# Patient Record
Sex: Female | Born: 1980 | Race: White | Hispanic: No | Marital: Married | Smoking: Never smoker
Health system: Southern US, Community
[De-identification: ages and names within clinical notes are randomized; demographics above are authoritative.]

## PROBLEM LIST (undated history)

## (undated) DIAGNOSIS — E7800 Pure hypercholesterolemia, unspecified: Secondary | ICD-10-CM

## (undated) DIAGNOSIS — F329 Major depressive disorder, single episode, unspecified: Secondary | ICD-10-CM

## (undated) DIAGNOSIS — I10 Essential (primary) hypertension: Secondary | ICD-10-CM

## (undated) DIAGNOSIS — H04123 Dry eye syndrome of bilateral lacrimal glands: Secondary | ICD-10-CM

## (undated) DIAGNOSIS — K219 Gastro-esophageal reflux disease without esophagitis: Secondary | ICD-10-CM

## (undated) DIAGNOSIS — D229 Melanocytic nevi, unspecified: Secondary | ICD-10-CM

## (undated) DIAGNOSIS — E119 Type 2 diabetes mellitus without complications: Secondary | ICD-10-CM

## (undated) DIAGNOSIS — F319 Bipolar disorder, unspecified: Secondary | ICD-10-CM

## (undated) DIAGNOSIS — N921 Excessive and frequent menstruation with irregular cycle: Secondary | ICD-10-CM

## (undated) DIAGNOSIS — E282 Polycystic ovarian syndrome: Secondary | ICD-10-CM

## (undated) DIAGNOSIS — E669 Obesity, unspecified: Secondary | ICD-10-CM

## (undated) DIAGNOSIS — D509 Iron deficiency anemia, unspecified: Secondary | ICD-10-CM

## (undated) DIAGNOSIS — F411 Generalized anxiety disorder: Secondary | ICD-10-CM

## (undated) DIAGNOSIS — D259 Leiomyoma of uterus, unspecified: Secondary | ICD-10-CM

## (undated) DIAGNOSIS — G43909 Migraine, unspecified, not intractable, without status migrainosus: Secondary | ICD-10-CM

## (undated) DIAGNOSIS — J4520 Mild intermittent asthma, uncomplicated: Secondary | ICD-10-CM

## (undated) DIAGNOSIS — Z8639 Personal history of other endocrine, nutritional and metabolic disease: Secondary | ICD-10-CM

## (undated) DIAGNOSIS — J45909 Unspecified asthma, uncomplicated: Secondary | ICD-10-CM

## (undated) DIAGNOSIS — Z8619 Personal history of other infectious and parasitic diseases: Secondary | ICD-10-CM

## (undated) DIAGNOSIS — R0600 Dyspnea, unspecified: Secondary | ICD-10-CM

## (undated) DIAGNOSIS — F32A Depression, unspecified: Secondary | ICD-10-CM

## (undated) DIAGNOSIS — Z973 Presence of spectacles and contact lenses: Secondary | ICD-10-CM

## (undated) DIAGNOSIS — R51 Headache: Secondary | ICD-10-CM

## (undated) DIAGNOSIS — L309 Dermatitis, unspecified: Secondary | ICD-10-CM

## (undated) DIAGNOSIS — G4730 Sleep apnea, unspecified: Secondary | ICD-10-CM

## (undated) DIAGNOSIS — G4733 Obstructive sleep apnea (adult) (pediatric): Secondary | ICD-10-CM

## (undated) DIAGNOSIS — E782 Mixed hyperlipidemia: Secondary | ICD-10-CM

## (undated) DIAGNOSIS — R519 Headache, unspecified: Secondary | ICD-10-CM

## (undated) DIAGNOSIS — B019 Varicella without complication: Secondary | ICD-10-CM

## (undated) DIAGNOSIS — J309 Allergic rhinitis, unspecified: Secondary | ICD-10-CM

## (undated) DIAGNOSIS — N390 Urinary tract infection, site not specified: Secondary | ICD-10-CM

## (undated) DIAGNOSIS — J302 Other seasonal allergic rhinitis: Secondary | ICD-10-CM

## (undated) DIAGNOSIS — R42 Dizziness and giddiness: Secondary | ICD-10-CM

## (undated) DIAGNOSIS — F419 Anxiety disorder, unspecified: Secondary | ICD-10-CM

## (undated) DIAGNOSIS — R32 Unspecified urinary incontinence: Secondary | ICD-10-CM

## (undated) DIAGNOSIS — D069 Carcinoma in situ of cervix, unspecified: Secondary | ICD-10-CM

## (undated) DIAGNOSIS — K8020 Calculus of gallbladder without cholecystitis without obstruction: Secondary | ICD-10-CM

## (undated) DIAGNOSIS — N946 Dysmenorrhea, unspecified: Secondary | ICD-10-CM

## (undated) HISTORY — DX: Varicella without complication: B01.9

## (undated) HISTORY — PX: TYMPANOSTOMY TUBE PLACEMENT: SHX32

## (undated) HISTORY — DX: Unspecified asthma, uncomplicated: J45.909

## (undated) HISTORY — DX: Headache, unspecified: R51.9

## (undated) HISTORY — DX: Depression, unspecified: F32.A

## (undated) HISTORY — DX: Calculus of gallbladder without cholecystitis without obstruction: K80.20

## (undated) HISTORY — DX: Personal history of other infectious and parasitic diseases: Z86.19

## (undated) HISTORY — DX: Dizziness and giddiness: R42

## (undated) HISTORY — DX: Essential (primary) hypertension: I10

## (undated) HISTORY — DX: Gastro-esophageal reflux disease without esophagitis: K21.9

## (undated) HISTORY — DX: Melanocytic nevi, unspecified: D22.9

## (undated) HISTORY — DX: Other seasonal allergic rhinitis: J30.2

## (undated) HISTORY — DX: Bipolar disorder, unspecified: F31.9

## (undated) HISTORY — DX: Migraine, unspecified, not intractable, without status migrainosus: G43.909

## (undated) HISTORY — PX: TUBAL LIGATION: SHX77

## (undated) HISTORY — DX: Dysmenorrhea, unspecified: N94.6

## (undated) HISTORY — DX: Urinary tract infection, site not specified: N39.0

## (undated) HISTORY — PX: KNEE SURGERY: SHX244

## (undated) HISTORY — DX: Dermatitis, unspecified: L30.9

## (undated) HISTORY — DX: Unspecified urinary incontinence: R32

## (undated) HISTORY — DX: Major depressive disorder, single episode, unspecified: F32.9

## (undated) HISTORY — DX: Headache: R51

## (undated) HISTORY — PX: TONSILLECTOMY AND ADENOIDECTOMY: SUR1326

## (undated) HISTORY — DX: Carcinoma in situ of cervix, unspecified: D06.9

---
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
## 2001-11-15 ENCOUNTER — Other Ambulatory Visit: Admission: RE | Admit: 2001-11-15 | Discharge: 2001-11-15 | Payer: Self-pay | Admitting: Gynecology

## 2001-11-15 ENCOUNTER — Other Ambulatory Visit: Admission: RE | Admit: 2001-11-15 | Discharge: 2001-11-15 | Payer: Self-pay | Admitting: Obstetrics and Gynecology

## 2002-02-11 ENCOUNTER — Encounter: Payer: Self-pay | Admitting: Obstetrics and Gynecology

## 2002-02-11 ENCOUNTER — Ambulatory Visit (HOSPITAL_COMMUNITY): Admission: RE | Admit: 2002-02-11 | Discharge: 2002-02-11 | Payer: Self-pay | Admitting: Obstetrics and Gynecology

## 2002-05-02 ENCOUNTER — Inpatient Hospital Stay (HOSPITAL_COMMUNITY): Admission: AD | Admit: 2002-05-02 | Discharge: 2002-05-02 | Payer: Self-pay

## 2002-05-29 ENCOUNTER — Other Ambulatory Visit: Admission: RE | Admit: 2002-05-29 | Discharge: 2002-05-29 | Payer: Self-pay | Admitting: Obstetrics and Gynecology

## 2002-06-15 ENCOUNTER — Inpatient Hospital Stay (HOSPITAL_COMMUNITY): Admission: AD | Admit: 2002-06-15 | Discharge: 2002-06-18 | Payer: Self-pay | Admitting: Obstetrics and Gynecology

## 2002-07-30 ENCOUNTER — Other Ambulatory Visit: Admission: RE | Admit: 2002-07-30 | Discharge: 2002-07-30 | Payer: Self-pay | Admitting: Obstetrics and Gynecology

## 2003-08-07 ENCOUNTER — Emergency Department (HOSPITAL_COMMUNITY): Admission: EM | Admit: 2003-08-07 | Discharge: 2003-08-07 | Payer: Self-pay | Admitting: Emergency Medicine

## 2003-09-26 ENCOUNTER — Emergency Department (HOSPITAL_COMMUNITY): Admission: EM | Admit: 2003-09-26 | Discharge: 2003-09-27 | Payer: Self-pay | Admitting: Emergency Medicine

## 2003-10-01 ENCOUNTER — Emergency Department (HOSPITAL_COMMUNITY): Admission: EM | Admit: 2003-10-01 | Discharge: 2003-10-01 | Payer: Self-pay | Admitting: Family Medicine

## 2003-10-05 ENCOUNTER — Emergency Department (HOSPITAL_COMMUNITY): Admission: AD | Admit: 2003-10-05 | Discharge: 2003-10-05 | Payer: Self-pay | Admitting: Family Medicine

## 2003-10-31 ENCOUNTER — Emergency Department (HOSPITAL_COMMUNITY): Admission: EM | Admit: 2003-10-31 | Discharge: 2003-10-31 | Payer: Self-pay | Admitting: Family Medicine

## 2004-01-07 ENCOUNTER — Other Ambulatory Visit: Admission: RE | Admit: 2004-01-07 | Discharge: 2004-01-07 | Payer: Self-pay | Admitting: Obstetrics & Gynecology

## 2004-01-08 ENCOUNTER — Other Ambulatory Visit: Admission: RE | Admit: 2004-01-08 | Discharge: 2004-01-08 | Payer: Self-pay | Admitting: Obstetrics & Gynecology

## 2004-06-13 ENCOUNTER — Inpatient Hospital Stay (HOSPITAL_COMMUNITY): Admission: AD | Admit: 2004-06-13 | Discharge: 2004-06-14 | Payer: Self-pay | Admitting: Obstetrics & Gynecology

## 2004-06-21 ENCOUNTER — Ambulatory Visit (HOSPITAL_COMMUNITY): Admission: RE | Admit: 2004-06-21 | Discharge: 2004-06-21 | Payer: Self-pay | Admitting: Obstetrics and Gynecology

## 2004-06-21 ENCOUNTER — Other Ambulatory Visit: Admission: RE | Admit: 2004-06-21 | Discharge: 2004-06-21 | Payer: Self-pay | Admitting: Obstetrics and Gynecology

## 2004-08-17 ENCOUNTER — Inpatient Hospital Stay (HOSPITAL_COMMUNITY): Admission: AD | Admit: 2004-08-17 | Discharge: 2004-08-17 | Payer: Self-pay | Admitting: Obstetrics and Gynecology

## 2004-08-22 ENCOUNTER — Inpatient Hospital Stay (HOSPITAL_COMMUNITY): Admission: AD | Admit: 2004-08-22 | Discharge: 2004-08-25 | Payer: Self-pay | Admitting: Obstetrics and Gynecology

## 2004-09-22 ENCOUNTER — Other Ambulatory Visit: Admission: RE | Admit: 2004-09-22 | Discharge: 2004-09-22 | Payer: Self-pay | Admitting: Obstetrics and Gynecology

## 2004-10-02 DIAGNOSIS — D069 Carcinoma in situ of cervix, unspecified: Secondary | ICD-10-CM

## 2004-10-02 DIAGNOSIS — Z87410 Personal history of cervical dysplasia: Secondary | ICD-10-CM

## 2004-10-02 HISTORY — DX: Carcinoma in situ of cervix, unspecified: D06.9

## 2004-10-02 HISTORY — DX: Personal history of cervical dysplasia: Z87.410

## 2004-10-09 ENCOUNTER — Emergency Department (HOSPITAL_COMMUNITY): Admission: EM | Admit: 2004-10-09 | Discharge: 2004-10-09 | Payer: Self-pay | Admitting: Family Medicine

## 2005-01-03 ENCOUNTER — Ambulatory Visit: Payer: Self-pay | Admitting: Psychiatry

## 2005-01-03 ENCOUNTER — Inpatient Hospital Stay (HOSPITAL_COMMUNITY): Admission: RE | Admit: 2005-01-03 | Discharge: 2005-01-06 | Payer: Self-pay | Admitting: Psychiatry

## 2005-02-17 ENCOUNTER — Other Ambulatory Visit: Admission: RE | Admit: 2005-02-17 | Discharge: 2005-02-17 | Payer: Self-pay | Admitting: Obstetrics and Gynecology

## 2005-06-23 ENCOUNTER — Other Ambulatory Visit: Admission: RE | Admit: 2005-06-23 | Discharge: 2005-06-23 | Payer: Self-pay | Admitting: Obstetrics and Gynecology

## 2006-06-27 ENCOUNTER — Emergency Department (HOSPITAL_COMMUNITY): Admission: EM | Admit: 2006-06-27 | Discharge: 2006-06-28 | Payer: Self-pay | Admitting: Emergency Medicine

## 2006-07-04 HISTORY — PX: TUBAL LIGATION: SHX77

## 2007-01-04 ENCOUNTER — Ambulatory Visit (HOSPITAL_COMMUNITY): Admission: RE | Admit: 2007-01-04 | Discharge: 2007-01-04 | Payer: Self-pay | Admitting: Obstetrics and Gynecology

## 2007-02-04 ENCOUNTER — Inpatient Hospital Stay (HOSPITAL_COMMUNITY): Admission: AD | Admit: 2007-02-04 | Discharge: 2007-02-07 | Payer: Self-pay | Admitting: Obstetrics and Gynecology

## 2007-03-13 ENCOUNTER — Inpatient Hospital Stay (HOSPITAL_COMMUNITY): Admission: AD | Admit: 2007-03-13 | Discharge: 2007-03-13 | Payer: Self-pay | Admitting: Obstetrics and Gynecology

## 2007-03-21 ENCOUNTER — Inpatient Hospital Stay (HOSPITAL_COMMUNITY): Admission: AD | Admit: 2007-03-21 | Discharge: 2007-03-21 | Payer: Self-pay | Admitting: Obstetrics and Gynecology

## 2007-03-27 ENCOUNTER — Inpatient Hospital Stay (HOSPITAL_COMMUNITY): Admission: AD | Admit: 2007-03-27 | Discharge: 2007-03-30 | Payer: Self-pay | Admitting: Obstetrics & Gynecology

## 2007-05-08 ENCOUNTER — Ambulatory Visit (HOSPITAL_COMMUNITY): Admission: RE | Admit: 2007-05-08 | Discharge: 2007-05-08 | Payer: Self-pay | Admitting: Obstetrics and Gynecology

## 2007-05-08 HISTORY — PX: LAPAROSCOPY WITH TUBAL LIGATION: SHX5576

## 2009-02-25 ENCOUNTER — Emergency Department (HOSPITAL_COMMUNITY): Admission: EM | Admit: 2009-02-25 | Discharge: 2009-02-25 | Payer: Self-pay | Admitting: Emergency Medicine

## 2010-11-16 NOTE — Discharge Summary (Signed)
NAME:  Debra Miller, Debra Miller                 ACCOUNT NO.:  [id number]   MEDICAL RECORD NO.:  [id number]          PATIENT TYPE:  INP   LOCATION:  9153                          FACILITY:  [facility]   PHYSICIAN:  Ilda Mori, M.D.   DATE OF BIRTH:  Sep 04, 1980   DATE OF ADMISSION:  02/04/2007  DATE OF DISCHARGE:  02/07/2007                               DISCHARGE SUMMARY   ADMITTING DIAGNOSIS:  Right pyelonephritis.   SECONDARY DIAGNOSIS:  A 33-1/2-week intrauterine pregnancy, undelivered   CONDITION ON DISCHARGE:  Improved.   PROCEDURES DURING HOSPITALIZATION:  IV antibiotics.   This is a 30 year old gravida 7, para 3-1-0-4 at 33 weeks and 4 days who  was admitted with right flank pain and low grade fever and with  leukocytosis.  The diagnosis of pyelonephritis was entertained, and  pending urine culture the patient was started on ampicillin and  gentamicin.  During the course of the hospitalization the patient noted  some contractions and back pain.  This was treated with IV analgesia and  occasional subcu terbutaline tocolysis.  A fetal fibronectin was drawn,  and it was negative.  The cervix never showed any signs of dilation, and  the presenting part was very high.  The patient became afebrile and  remained so for 24 hours prior to discharge.  Her white count fell to  9.2.  On the third post hospital day the patient was afebrile and ready  for discharge.  She was discharged on a regular diet.  She was told she  could resume her normal activities as she felt comfortable.  She was  given Macrobid to take twice a day for five days and then take daily for  suppression and told to keep her already scheduled appointment.  To  return to the office in 9 days.   LABORATORY DATA:  Her fetal fibronectin was negative.  Her group urine  was positive for E. coli greater than 100,000 colonies.  Her group B  strep is still pending at the time of this dictation.  Creatinine was  0.64.  White count on  admission was 15,400 and on discharge was 9.2.      Ilda Mori, M.D.  Electronically Signed     RK/MEDQ  D:  02/07/2007  T:  02/07/2007  Job:  [id number]

## 2010-11-16 NOTE — Op Note (Signed)
NAME:  Debra Miller, Debra Miller                 ACCOUNT NO.:  [id number]   MEDICAL RECORD NO.:  [id number]          PATIENT TYPE:  AMB   LOCATION:  SDC                           FACILITY:  [facility]   PHYSICIAN:  Randye Lobo, M.D.   DATE OF BIRTH:  01-25-1981   DATE OF PROCEDURE:  05/08/2007  DATE OF DISCHARGE:                               OPERATIVE REPORT   PREOPERATIVE DIAGNOSIS:  1. Multiparous female.  2. Desire for permanent sterilization.   POSTOPERATIVE DIAGNOSIS:  1. Multiparous female.  2. Desire for permanent sterilization.   PROCEDURE:  Laparoscopic bilateral tubal ligation with bipolar cautery.   SURGEON:  Conley Simmonds, MD   ANESTHESIA:  General endotracheal.   IV FLUIDS:  1000 mL Ringer's lactate.   ESTIMATED BLOOD LOSS:  Minimal.   URINE OUTPUT:  100 mL by I&O catheterization prior to procedure.   COMPLICATIONS:  None.   INDICATIONS FOR PROCEDURE:  The patient is a 30 year old gravida 51, para  5-0-2-5 Caucasian female, status post spontaneous vaginal delivery of a  viable female on March 28, 2007, who desires permanent  sterilization.  The patient declines reversible contraception.  A plan  is made now to proceed with a laparoscopic bilateral tubal ligation.  The patient understands that there is a risk of failure of the tubal  ligation including intrauterine and ectopic pregnancy at a rate of  approximately 1 in 250 to 1 in 300.  The risks, benefits, and  alternatives have been discussed with the patient who wishes to proceed.   FINDINGS:  Laparoscopy demonstrated a normal uterus, tubes and ovaries.  There were small 0.5 cm hydatid cysts attached to the distal ends of  each fallopian tube.  The appendix, liver, gallbladder, upper abdomen,  and pelvis appeared to be unremarkable.  There was no evidence of any  adhesive disease or endometriosis appreciated.   SPECIMENS:  None.   DESCRIPTION OF PROCEDURE:  The patient was reidentified in the  preoperative hold area.   She did receive Ancef 1 gram IV for antibiotic  prophylaxis.  In the operating room, general endotracheal anesthesia was  induced.  The patient was placed in the dorsal lithotomy position and  the abdomen and vagina were sterilely prepped and draped.  The patient  was in-and-out catheterized.  A speculum was placed inside the vagina  and a single tooth tenaculum was placed on the anterior cervical lip.  This was then replaced with a Hulka tenaculum.  The remaining vaginal  instruments were removed.  The patient was sterilely draped.   The procedure began by creating a 1 cm umbilical incision with the  scalpel.  The incision was carried down to the fascia using an Allis  clamp.  A 10 mm trocar was inserted directly into the peritoneal cavity  without difficulty.  The laparoscope confirmed proper placement.  A CO2  pneumoperitoneum was achieved and the patient was then placed in the  Trendelenburg position.  A 5 mm suprapubic incision was created 2  fingerbreadths above the pubic symphysis.  A 5 mm trocar was inserted  directly into the  peritoneal cavity under visualization of the  laparoscope.  An inspection of the pelvic and abdominal organs was  performed and the findings were as noted above.   The procedure began by grasping the right fallopian tube and following  it all the way to its fimbriated end.  The isthmic portion of the  fallopian tube was then fulgurated along a 3 cm segment of tissue so  that there was good blanching into the mesosalpinx.  The same procedure  that was performed on the right fallopian tube was then repeated on the  left fallopian tube after it was grasped and followed all the way to its  fimbriated end.   Hemostasis was good at the termination of the procedure.  The 5 mm  suprapubic incision was removed under visualization of the laparoscope.  The pneumoperitoneum was released and a 10 mm umbilical trocar and  laparoscope were removed simultaneously.  The  incisions were closed with  subcuticular sutures of 3-0 plain gut suture and these were then closed  by sterile bandages.  All of the remaining Betadine was cleansed off  of  the patient's abdomen.  The Hulka tenaculum was removed from the uterus.  The patient was awakened, extubated and escorted to the recovery room in  stable and awake condition.  There were no complications to the  procedure.  All needle, instrument, and sponge counts were correct.      Randye Lobo, M.D.  Electronically Signed     BES/MEDQ  D:  05/08/2007  T:  05/08/2007  Job:  [id number]

## 2010-11-19 NOTE — Discharge Summary (Signed)
NAME:  Debra Miller, Debra Miller NO.:  [id number]   MEDICAL RECORD NO.:  [id number]          PATIENT TYPE:  IPS   LOCATION:  0302                          [facility]ILITY:  [facility]   PHYSICIAN:  Jeanice Lim, M.D. DATE OF BIRTH:  April 07, 1981   DATE OF ADMISSION:  01/03/2005  DATE OF DISCHARGE:  01/06/2005                                 DISCHARGE SUMMARY   IDENTIFYING DATA:  This is a 30 year old female admitted with a history of  feeling overwhelmed, feels as if a volcano exploded in her, undergoing too  much pressure with financial difficulties, marital conflict, to calm  threatened to cut herself.  Husband called the police.  Adamant about not  taking any medications.   ADMISSION MEDICATIONS:  The patient is on albuterol inhaler and had been  Zoloft years ago but stated it did not work.  No primary care physician  reported.   ALLERGIES:  CECLOR.   PHYSICAL AND NEUROLOGICAL EXAMINATION:  Essentially within normal limits.   ROUTINE ADMISSION LABS:  Within normal limits.   MENTAL STATUS EXAM:  Alert young female, cooperative, fair eye contact.  Speech clear.  The patient felt depressed, tearful at times.  Thought  process goal directed, no evidence of psychosis.  Cognitively intact.  Judgment and insight were fair.  The patient appeared sincere, forthcoming  and impulse control within reasonable limits.   ADMISSION DIAGNOSES:  AXIS I:  Major depressive disorder, recurrent,  moderate, versus adjustment disorder with depressed mood.  AXIS II:  Deferred.  AXIS III:  Asthma and migraines.  AXIS IV:  Moderate problems to severe with primary support group, conflict  in relationship, financial stress and other psychosocial issues.  AXIS V:  30/50;   HOSPITAL COURSE:  The patient was admitted and ordered routine p.r.n.  medications, underwent further monitoring.  The patient was monitored for  safety, participated in  group and worked on aftercare plan.  The patient  agreed to a  family session with husband and then agreed on trial of  Lamictal.  Risk/benefit ratio and alternative treatments including risk of  rash and to discontinued medication immediately upon notice of any form of  rash and call physician.  The patient was discharged in improved condition,  move was euthymic, affect brighter, thought process goal directed, improved  coping skills and future oriented, with good aftercare plan and appeared  motivated, with a good prognosis.  The patient was again given medication  education and discharged on Lamictal 25 mg for 12 days and then 2 in the  morning, Ambien 10 mg 1/2 to 1 q.h.s.  The patient was to follow up with Dr.  Lang Snow on July 11 at 11:30.   DISCHARGE DIAGNOSES:  AXIS I:  Major depressive disorder, recurrent,  moderate, versus adjustment disorder with depressed mood.  AXIS II:  Deferred.  AXIS III:  Asthma and migraines.  AXIS IV:  Moderate problems to severe with primary support group, conflict  in relationship, financial stress and other psychosocial issues.  AXIS V:  Global assessment of function on discharge was 55-60 and condition  was improved.       JEM/MEDQ  D:  02/08/2005  T:  02/09/2005  Job:  [id number]

## 2010-11-19 NOTE — H&P (Signed)
NAME:  Debra Miller, Debra Miller NO.:  [id number]   MEDICAL RECORD NO.:  [id number]          PATIENT TYPE:  IPS   LOCATION:  0302                          [facility]   PHYSICIAN:  Jeanice Lim, M.D. DATE OF BIRTH:  07-25-1980   DATE OF ADMISSION:  01/03/2005  DATE OF DISCHARGE:                         PSYCHIATRIC ADMISSION ASSESSMENT   HISTORY OF PRESENT ILLNESS:  The patient presents with a history of feeling  very overwhelmed. Feels as if a volcano had exploded in her. She feels that  she has been undergoing too much pressure with stressors of financial  difficulties and marital conflict. The patient has been arguing with her  husband. She was threatening to cut herself. Husband called the police. The  patient reports decreased sleep. Her appetite has been satisfactory. The  patient denies any psychotic symptoms. She does report history of anxiety  and is adamant about not taking any medications.   PAST PSYCHIATRIC HISTORY:  First admission to [facility].  Currently sponsored by [facility]. Has a history of  suicidal thoughts but no attempts.   SOCIAL HISTORY:  She is a 30 year old married white female, married for 6  years.  Got married at the age of 62, has 4 children ages 75, 40, 2 and 24  months of age. Has a ninth grade education. She lives with her husband and  children. The patient stays at home caring for the children. Denies any  criminal activity. States her husband works two jobs and had a lot of stress  from his family as well.   FAMILY HISTORY:  The patient denies.   ALCOHOL AND DRUG HISTORY:  Nonsmoker. Denies any alcohol or drug use.   PRIMARY CARE PHYSICIAN:  None.   MEDICAL PROBLEMS:  Asthma and migraines.   MEDICATIONS:  The patient takes an albuterol inhaler. Was on Zoloft years  ago but states it did not work.   DRUG ALLERGIES:  CECLOR and __________.   REVIEW OF SYSTEMS:  The patient denies any chest  pain, shortness of breath,  nausea, vomiting, or blurred vision. Reports some problems with insomnia.   PHYSICAL EXAMINATION:  VITAL SIGNS:  Temperature is 99.6, pulse 80,  respiratory rate 20, blood pressure is 162/94, 5 feet 5 inches tall, 242  pounds.  GENERAL:  This is an overweight female in no acute distress.  HEENT:  The trachea is midline.  CHEST:  Clear.  BREASTS:  Exam is deferred.  HEART:  Regular rate and rhythm.  ABDOMEN:  Soft, obese, nontender.  GENITOURINARY:  Deferred.  EXTREMITIES:  No clubbing. No edema. The patient has 5+ against resistance.  There is a rash on her neck.  Superficial scratch to her left wrist from a  reported attempt to cut her wrist.  NEUROLOGICAL:  Findings are intact.   LABORATORY DATA:  CBC within normal limits. CMET within normal limits. Urine  drug screen, pregnancy test, and TSH are pending.   MENTAL STATUS EXAM:  Alert, young female, cooperative, fair eye contact.  Speech is clear. The patient feels depressed. The patient gets tearful  at  times. Thought processes are coherent. There is no evidence of psychosis.  Cognitive function intact. Memory is good. Judgment and insight is fair.  Patient appears sincere.   AXIS I:  Major depressive disorder.   AXIS II:  Deferred.   AXIS III:  Asthma and migraines.   AXIS IV:  Problems with primary support group and other psychosocial  problems.   AXIS V:  Current is 30, estimated this past year is 65 to 53   PLAN:  Plan to stabilize mood and thinking. Will contract for safety. Will  help patient increase her coping skills. Risks and benefits of an  antidepressant were discussed. The patient is still adamant about taking any  medications. Will do a family session with her support group. Tentative  length of stay is 4 to 5 days.       JO/MEDQ  D:  01/05/2005  T:  01/05/2005  Job:  [id number]

## 2010-11-19 NOTE — Discharge Summary (Signed)
[facility]  Patient:    Debra Miller, Debra Miller                        MRN: [id number] Adm. Date:  [id number] Disc. Date: [id number] Attending:  Osborn Coho                           Discharge Summary  DISCHARGE DIAGNOSES:          1. Term pregnancy delivered 9 pound 9 ounce                                  female infant Apgars 9 and 9.                               2. Positive group B strep by history.                               3. Blood type O-, RhoGAM given.  PROCEDURE:                    1. Normal spontaneous delivery.                               2. Repair of left labial tear.                               3. Intrapartum and postpartum antibiotics.  SUMMARY:                      This 30 year old gravida 4, para 1 was admitted at term in labor.  Her pregnancy was uncomplicated.  Patient has a history of positive group B strep.  She was admitted, placed on antibiotics, and underwent amniotomy with clear fluid production.  She underwent Pitocin augmentation, had an epidural, and progressed along the normal labor curve. She had a rapid second stage and delivered a viable  9 pound 9 ounce female infant with Apgars of 9 and 9 over intact perineum, although there was a left labial tear that required repair.  On the day after delivery patient began having a flu like syndrome.  She was seen and started on Augmentin as well as Tamiflu.  On the morning of February 5 she was feeling much better. Postpartum course was progressing well and she was given all appropriate instructions and discharged to home.  DISCHARGE MEDICATIONS:        1. Tamiflu 75 mg b.i.d. for a total of four more                                  days.                               2. Augmentin 250 mg t.i.d. for five more days.                               3. Tylox one  to two q.4-6h. p.r.n. severe pain.                               4. Motrin 600 mg q.6h. for less severe pain.                         5. Tussionex one teaspoon q.12h. p.r.n. cough.                               6. Vitamins.                               7. Iron.  FOLLOW-UP:                    Four weeks time.  Was breast-feeding without difficulty at the time of discharge.  CONDITION ON DISCHARGE:       Improved. DD:  09/27/00 TD:  09/27/00 Job: [id number]

## 2010-11-19 NOTE — Discharge Summary (Signed)
[facility]  Patient:    Debra Miller, Debra Miller                        MRN: [id number] Adm. Date:  [id number] Disc. Date: [id number] Attending:  Conley Simmonds A Dictator:   Leilani Able, P.A.                           Discharge Summary  FINAL DIAGNOSIS:              At [redacted] weeks gestation with preterm contractions.  HISTORY:                      This 30 year old G4, P1, presents at 29 weeks with preterm contractions.  The patient had currently been on terbutaline for this problem and was told her cervix was soft with cramping, and contractions were worsening today, so she presented to triage.  HOSPITAL COURSE:              She was admitted at that time.  She did have a history of a positive group B strep culture.  Her cervix was long and closed but was softening.  She was started on magnesium sulfate and Unasyn.  After being on magnesium sulfate, the patients contractions were rare, and she did have some calf cramps which improved.  The babys fetal heart tones were reactive without decelerations.  She was felt ready to stop magnesium sulfate later that day and was started on oral terbutaline.  By the next day, the patient was not have any contractions on oral terbutaline and was felt ready for discharge.  DIET:                         Regular.  ACTIVITY:                     She was told to decrease activity.  DISCHARGE MEDICATIONS:        She was told to continue oral terbutaline and call if contractions resumed. DD:  06/26/00 TD:  06/27/00 Job: [id number]

## 2010-11-19 NOTE — Op Note (Signed)
[facility]  Patient:    Debra Miller, Debra Miller                        MRN: [id number] Proc. Date: 09/15/99 Adm. Date:  [id number] Attending:  Conley Simmonds A                           Operative Report  PREOPERATIVE DIAGNOSIS:       Missed abortion versus ectopic pregnancy.  POSTOPERATIVE DIAGNOSIS:      Missed abortion.  OPERATION:                    Examination under anesthesia, dilatation and evacuation.  SURGEON:                      Conley Simmonds, M.D.  ASSISTANT:  ANESTHESIA:                   MAC, paracervical block with 1% lidocaine.  IV FLUIDS:                    1200 cc Ringers lactate.  ESTIMATED BLOOD LOSS:         Minimal.  URINE OUTPUT:                 75 cc prior to procedure with an I&O catheter.  SPECIMENS:                    The intrauterine contents were sent to pathology or a frozen section, and the presence of villi was confirmed.  FINDINGS:                     Examination under anesthesia revealed a 6 week size, anteverted, mobile uterus.  No adnexal masses were appreciated.  A mild amount f intrauterine tissue was sent to pathology, and frozen section confirmed the presence of villi inside the uterus.  INDICATIONS:                  The patient was a 30 year old, gravida 3, para 1-0-1-1, Caucasian female with a last menstrual period July 05, 1999, who presented to the emergency department on September 11, 1999, complaining of vaginal  bleeding and a positive pregnancy test at home two weeks prior.  The patient had not previously seen a physician.  At that time, the patient was noted to have a  blood-stained vagina and no lesions on the cervix.  The uterus was felt to be 7  weeks size, and a transvaginal ultrasound documented a uterine cavity with a small 9.3 x 4.9 x 4.7 mm fluid area within the uterus.  No intrauterine pregnancy was  appreciated.  The right and left ovaries were unremarkable, and there was no evidence of  an ectopic pregnancy nor free fluid.  The patient was noted to have a blood type that was rh negative, and she received a dose of RhoGAM.  The patient went on to have serial quantitative beta hCGs, and these were noted to rise inappropriately.  The beta hCG from September 11, 1999, was 5973, and the beta hCG rom September 13, 1999, was noted to be 7468.  A discussion was held with the patient regarding the abnormally rising beta hCG levels and the diagnosis of a missed abortion versus an ectopic pregnancy.  Alternatives for treatment were discussed at this time, and  a decision was made to proceed with a dilatation and evacuation f the uterine contents followed by a frozen section of the specimen and laparoscopic removal of ectopic pregnancy if a froze section was negative.  The patient agreed to this procedure after the risks and benefits were reviewed.  DESCRIPTION OF PROCEDURE:     With an IV in place, the patient was taken to the  operating room and after she was properly identified.  The patient received MAC  anesthesia after she was placed in a supine position on the operating table. The patient was then placed in the dorsal lithotomy position and her vagina and perineum were sterilely prepped and draped.  An examination under anesthesia was performed, and the findings are as noted above.  The bladder was emptied with a red rubber catheter.  A speculum was placed in the vagina, and the single tooth tenaculum was placed on the anterior cervical lip.  The paracervical block was performed with 1% lidocaine and 10 cc total of lidocaine were injected at the 5 and 7 oclock positions.  The uterus was then sounded to 11.5 cm, and the cervix was serially dilated to a #23 Pratt dilator.  A suction-tip curet was then introduced into the uterus to the level of the fundus, and this as withdrawn two times.  A serrated curet was then used to remove any remaining tissue from within the  uterine cavity, and the endometrium had a characteristically gritty texture to it following this.  The suction-tip curet was passed one final time, and any remaining blood was removed.  At this time, hemostasis was noted to be excellent, and the single tooth tenaculum was removed.  A frozen section returned from the pathologist demonstrating the presence of villi and the procedure was completed.  The patient was cleansed of any remaining Betadine and she was taken out of the dorsal lithotomy position prior to this. The patient was then escorted to the recovery room in stable and awaken condition.  There were no complications to the procedure.  All sponge, needle, and instrument counts were correct. DD:  09/15/99 TD:  09/15/99 Job: [id number]

## 2011-04-12 LAB — URINALYSIS, ROUTINE W REFLEX MICROSCOPIC
Glucose, UA: NEGATIVE
Leukocytes, UA: NEGATIVE
Protein, ur: NEGATIVE
Specific Gravity, Urine: 1.025
pH: 6

## 2011-04-12 LAB — CBC
HCT: 39.2
Hemoglobin: 13.6
RBC: 4.59
RDW: 13.8

## 2011-04-12 LAB — URINE MICROSCOPIC-ADD ON

## 2011-04-14 LAB — RH IMMUNE GLOB WKUP(>/=20WKS)(NOT WOMEN'S HOSP): Fetal Screen: NEGATIVE

## 2011-04-14 LAB — URINALYSIS, ROUTINE W REFLEX MICROSCOPIC
Ketones, ur: 15 — AB
Leukocytes, UA: NEGATIVE
Nitrite: POSITIVE — AB
Protein, ur: NEGATIVE
Urobilinogen, UA: 4 — ABNORMAL HIGH
pH: 6

## 2011-04-14 LAB — CBC
HCT: 33.4 — ABNORMAL LOW
Hemoglobin: 11.6 — ABNORMAL LOW
MCV: 85.9
Platelets: 158
RBC: 3.83 — ABNORMAL LOW
RBC: 4.53
RDW: 14.3 — ABNORMAL HIGH
WBC: 8.2
WBC: 9.8

## 2011-04-14 LAB — URINE MICROSCOPIC-ADD ON

## 2011-04-14 LAB — BASIC METABOLIC PANEL
BUN: 5 — ABNORMAL LOW
Chloride: 105
Creatinine, Ser: 0.52
GFR calc Af Amer: >60
GFR calc non Af Amer: >60

## 2011-04-14 LAB — RPR: RPR Ser Ql: NONREACTIVE

## 2011-04-18 LAB — DIFFERENTIAL
Basophils Absolute: 0
Basophils Absolute: 0
Basophils Relative: 0
Basophils Relative: 0
Eosinophils Absolute: 0
Eosinophils Relative: 0
Lymphocytes Relative: 9 — ABNORMAL LOW
Monocytes Absolute: 0.9 — ABNORMAL HIGH
Monocytes Relative: 6
Neutro Abs: 13.2 — ABNORMAL HIGH
Neutrophils Relative %: 73

## 2011-04-18 LAB — CBC
HCT: 32.3 — ABNORMAL LOW
HCT: 33 — ABNORMAL LOW
HCT: 37.9
Hemoglobin: 11.1 — ABNORMAL LOW
Hemoglobin: 11.3 — ABNORMAL LOW
Hemoglobin: 13
MCHC: 34.3
MCHC: 34.3
MCHC: 34.3
MCV: 87.1
MCV: 87.3
MCV: 87.6
Platelets: 155
Platelets: 173
RBC: 3.78 — ABNORMAL LOW
RDW: 13.7
RDW: 13.9
RDW: 14.2 — ABNORMAL HIGH
WBC: 13.9 — ABNORMAL HIGH

## 2011-04-18 LAB — CREATININE, SERUM: GFR calc Af Amer: >60

## 2011-04-18 LAB — URINE CULTURE: Colony Count: >=100000

## 2011-04-18 LAB — URINALYSIS, ROUTINE W REFLEX MICROSCOPIC
Bilirubin Urine: NEGATIVE
Ketones, ur: 15 — AB
Nitrite: NEGATIVE
Protein, ur: NEGATIVE
Urobilinogen, UA: 1

## 2011-04-18 LAB — URINE MICROSCOPIC-ADD ON

## 2011-04-18 LAB — STREP B DNA PROBE

## 2011-04-19 LAB — RH IMMUNE GLOBULIN WORKUP (NOT WOMEN'S HOSP)
ABO/RH(D): O NEG
Antibody Screen: NEGATIVE

## 2013-01-28 ENCOUNTER — Ambulatory Visit (INDEPENDENT_AMBULATORY_CARE_PROVIDER_SITE_OTHER): Payer: BC Managed Care – PPO | Admitting: Family Medicine

## 2013-01-28 ENCOUNTER — Encounter: Payer: Self-pay | Admitting: Family Medicine

## 2013-01-28 VITALS — BP 134/98 | Temp 98.5°F | Ht 63.5 in | Wt 268.0 lb

## 2013-01-28 DIAGNOSIS — F319 Bipolar disorder, unspecified: Secondary | ICD-10-CM

## 2013-01-28 DIAGNOSIS — L708 Other acne: Secondary | ICD-10-CM

## 2013-01-28 DIAGNOSIS — Z7189 Other specified counseling: Secondary | ICD-10-CM

## 2013-01-28 DIAGNOSIS — Z23 Encounter for immunization: Secondary | ICD-10-CM

## 2013-01-28 DIAGNOSIS — Z7689 Persons encountering health services in other specified circumstances: Secondary | ICD-10-CM

## 2013-01-28 DIAGNOSIS — R32 Unspecified urinary incontinence: Secondary | ICD-10-CM

## 2013-01-28 DIAGNOSIS — G43909 Migraine, unspecified, not intractable, without status migrainosus: Secondary | ICD-10-CM

## 2013-01-28 DIAGNOSIS — L709 Acne, unspecified: Secondary | ICD-10-CM

## 2013-01-28 DIAGNOSIS — N926 Irregular menstruation, unspecified: Secondary | ICD-10-CM

## 2013-01-28 NOTE — Progress Notes (Addendum)
Chief Complaint  Patient presents with  . Establish Care    HPI:  Debra Miller is here to establish care. Haven't seen a family doctor in a long time. Last PCP and physical: 2008 with gyn  Has the following chronic problems and concerns today:  Patient Active Problem List   Diagnosis Date Noted  . Bipolar disorder, unspecified 01/28/2013  . Migraines 01/28/2013  . Irregular periods 01/28/2013  . Morbid obesity 01/28/2013   1)Bipolar Disorder: -dx in 2006 -Used to see psych for this with counseling and meds, off meds for a long time -more depression symptoms now: irritable, feeling down on many days, sleeps a lot during the day, trouble sleeping at night, worries a lot, very rare manic episodes -used to be on Depakote in the past - doesn't think it helped much -no thoughts of self harm -she is going to establish with [facility]  2) irregular periods: -started after last daughter, gyn gave her medication to regulate in the past -periods are monthly usually, but a little irregular  -bleeding is heavier then it used to be -periods last 5-7 days, feels tired sometimes -feels like has gained weight -has had 5 kids and has mild stress incontinence  3) obesity: -hungry all the time -depression interferes with exercise - no regular exercise -drinks many sodas daily, diet is not great  4)Migraines: -3 per month -chronic  5)Chronic back pain: -for many years -low back pain -denies: weakness, numbness, loss of bowel or bladder function  6)Carpel tunnel -worse at night, R hand numbness 1st 2 digits -worse on R, R hand dominant  7) Acne: -worse since gaining weight, worse on back, some on face and neck  8)HTN: -occ elevated and mild  Health Maintenance: -not up to date - will see gyn -thinks tdap > ten years  ROS: See pertinent positives and negatives per HPI.  Past Medical History  Diagnosis Date  . Asthma   . Chicken pox   . Depression   . Bipolar disorder    . Dizziness   . Frequent headaches   . GERD (gastroesophageal reflux disease)   . Seasonal allergies   . Migraines   . Urine incontinence   . UTI (urinary tract infection)   . H/O cold sores   . Eczema   . Hypertension     high blood pressure readings     Family History  Problem Relation Age of Onset  . Arthritis Mother   . Arthritis Maternal Grandmother   . Breast cancer Maternal Grandmother   . High Cholesterol Mother   . High Cholesterol Maternal Grandmother   . Stroke Maternal Grandmother   . Hypertension Mother   . Hypertension Maternal Grandmother   . Diabetes Mother     History   Social History  . Marital Status: Married    Spouse Name: N/A    Number of Children: N/A  . Years of Education: N/A   Social History Main Topics  . Smoking status: Never Smoker   . Smokeless tobacco: None  . Alcohol Use: No  . Drug Use: None  . Sexually Active: None   Other Topics Concern  . None   Social History Narrative   Work or School: homemaker      Home Situation: lives with husband and 5 kids (ages 6-14 in 2014)      Spiritual Beliefs: Christian      Lifestyle: no regular exercise, poor diet, lots of soda  No current outpatient prescriptions on file.  EXAM:  Filed Vitals:   01/28/13 0809  BP: 134/98  Temp: 98.5 F (36.9 C)    Body mass index is 46.72 kg/(m^2).  GENERAL: vitals reviewed and listed above, alert, oriented, appears well hydrated and in no acute distress  HEENT: atraumatic, conjunttiva clear, no obvious abnormalities on inspection of external nose and ears  NECK: no obvious masses on inspection, thyroid normal  LUNGS: clear to auscultation bilaterally, no wheezes, rales or rhonchi, good air movement  CV: HRRR, no peripheral edema  SKIN: papular pustular acne on back, no acne on face, few papules on neck/chest  MS: moves all extremities without noticeable abnormality, normal gait, no weakness in extremities  PSYCH:  pleasant and cooperative, no obvious depression or anxiety  ASSESSMENT AND PLAN:  Discussed the following assessment and plan:  Bipolar disorder, unspecified -advised to see psych as she wishes to do so and has not agreed with medication in the past -advised counseling and psychiatry and advised she tell psych about her migraines so the therpay may be helpful for her depression and migraines  Migraines -follow up after seeing psych -in meantime headache journal and trigger modification  Irregular periods - Plan: TSH, CBC with Differential -she will be seeningg gyn for this -checking tsh and cbc   Morbid obesity - Plan: Lipid Panel, TSH, Hemoglobin A1c, Basic metabolic panel -discussed lifestyle changes at length -goal to wean of soda and start exercise  Encounter to establish care - Plan: Basic metabolic panel  Urinary incontinence -kegals and timed voiding and she will see gyn  Acne -start with benzoyl peroxide wash  -We reviewed the PMH, PSH, FH, SH, Meds and Allergies. -We provided refills for any medications we will prescribe as needed. -We addressed current concerns per orders and patient instructions. -We have asked for records for pertinent exams, studies, vaccines and notes from previous providers. -We have advised patient to follow up per instructions below. -tdap today ->45 minutes spent with this patient; greater then 50% spent counseling pt    -Patient advised to return or notify a doctor immediately if symptoms worsen or persist or new concerns arise.  Patient Instructions  -We have ordered labs or studies at this visit. It can take up to 1-2 weeks for results and processing. We will contact you with instructions IF your results are abnormal. Normal results will be released to your [facility]. If you have not heard from [place] or can not find your results in [facility] in 2 weeks please contact our office.  -PLEASE SIGN UP FOR MYCHART TODAY   We recommend the  following healthy lifestyle measures: - eat a healthy diet consisting of lots of vegetables, fruits, beans, nuts, seeds, healthy meats such as white chicken and fish and whole grains.  - avoid fried foods, fast food, processed foods, sodas, red meet and other fattening foods.  - get a least 150 minutes of aerobic exercise per week.   Cock brace brace at night   Please wean off of soda and drink water  For acne get benzoyl peroxide wash - if moisturizer is needed consider cetaphil dermacontrol for oily skin  Call for psychiatry and gynecology appointments with numbers provided - please let us know if you need a referral  Follow up in: tomorrow for lab appointment for fasting labs and in 1 month for physical exam      KIM, HANNAH R.

## 2013-01-28 NOTE — Addendum Note (Signed)
Addended by: Azucena Freed on: 01/28/2013 09:01 AM   Modules accepted: Orders

## 2013-01-28 NOTE — Patient Instructions (Addendum)
-  We have ordered labs or studies at this visit. It can take up to 1-2 weeks for results and processing. We will contact you with instructions IF your results are abnormal. Normal results will be released to your [facility]. If you have not heard from [place] or can not find your results in [facility] in 2 weeks please contact our office.  -PLEASE SIGN UP FOR MYCHART TODAY   We recommend the following healthy lifestyle measures: - eat a healthy diet consisting of lots of vegetables, fruits, beans, nuts, seeds, healthy meats such as white chicken and fish and whole grains.  - avoid fried foods, fast food, processed foods, sodas, red meet and other fattening foods.  - get a least 150 minutes of aerobic exercise per week.   Cock brace brace at night   Please wean off of soda and drink water  For acne get benzoyl peroxide wash - if moisturizer is needed consider cetaphil dermacontrol for oily skin  Call for psychiatry and gynecology appointments with numbers provided - please let us know if you need a referral  Follow up in: tomorrow for lab appointment for fasting labs and in 1 month for physical exam

## 2013-01-29 ENCOUNTER — Other Ambulatory Visit: Payer: BC Managed Care – PPO

## 2013-01-29 LAB — TSH: TSH: 1.95 u[IU]/mL (ref 0.35–5.50)

## 2013-01-29 LAB — BASIC METABOLIC PANEL
Calcium: 9.3 mg/dL (ref 8.4–10.5)
Creatinine, Ser: 0.7 mg/dL (ref 0.4–1.2)

## 2013-01-29 LAB — CBC WITH DIFFERENTIAL/PLATELET
Basophils Absolute: 0 10*3/uL (ref 0.0–0.1)
Eosinophils Absolute: 0.2 10*3/uL (ref 0.0–0.7)
HCT: 41.7 % (ref 36.0–46.0)
Hemoglobin: 14.1 g/dL (ref 12.0–15.0)
Lymphocytes Relative: 42.9 % (ref 12.0–46.0)
Lymphs Abs: 3.4 10*3/uL (ref 0.7–4.0)
MCHC: 33.8 g/dL (ref 30.0–36.0)
Neutro Abs: 3.8 10*3/uL (ref 1.4–7.7)
RDW: 12.8 % (ref 11.5–14.6)

## 2013-01-29 LAB — LIPID PANEL
HDL: 29.7 mg/dL — ABNORMAL LOW (ref >39.00)
Triglycerides: 372 mg/dL — ABNORMAL HIGH (ref 0.0–149.0)
VLDL: 74.4 mg/dL — ABNORMAL HIGH (ref 0.0–40.0)

## 2013-01-29 LAB — LDL CHOLESTEROL, DIRECT: Direct LDL: 111.3 mg/dL

## 2013-01-29 NOTE — Progress Notes (Signed)
Quick Note:  Left a message for return call. ______ 

## 2013-01-30 NOTE — Progress Notes (Signed)
Quick Note:  Called and spoke with pt and pt is aware. ______ 

## 2013-02-13 ENCOUNTER — Encounter: Payer: BC Managed Care – PPO | Admitting: Obstetrics and Gynecology

## 2013-02-18 ENCOUNTER — Encounter: Payer: Self-pay | Admitting: Obstetrics and Gynecology

## 2013-02-18 ENCOUNTER — Ambulatory Visit (INDEPENDENT_AMBULATORY_CARE_PROVIDER_SITE_OTHER): Payer: BC Managed Care – PPO | Admitting: Obstetrics and Gynecology

## 2013-02-18 VITALS — BP 124/80 | HR 74 | Ht 63.5 in | Wt 268.0 lb

## 2013-02-18 DIAGNOSIS — N812 Incomplete uterovaginal prolapse: Secondary | ICD-10-CM

## 2013-02-18 DIAGNOSIS — Z0000 Encounter for general adult medical examination without abnormal findings: Secondary | ICD-10-CM

## 2013-02-18 DIAGNOSIS — N920 Excessive and frequent menstruation with regular cycle: Secondary | ICD-10-CM

## 2013-02-18 DIAGNOSIS — N390 Urinary tract infection, site not specified: Secondary | ICD-10-CM

## 2013-02-18 DIAGNOSIS — N946 Dysmenorrhea, unspecified: Secondary | ICD-10-CM

## 2013-02-18 DIAGNOSIS — N3946 Mixed incontinence: Secondary | ICD-10-CM

## 2013-02-18 DIAGNOSIS — Z01419 Encounter for gynecological examination (general) (routine) without abnormal findings: Secondary | ICD-10-CM

## 2013-02-18 LAB — POCT URINALYSIS DIPSTICK
Nitrite, UA: NEGATIVE
Protein, UA: NEGATIVE
pH, UA: 5

## 2013-02-18 MED ORDER — SULFAMETHOXAZOLE-TRIMETHOPRIM 800-160 MG PO TABS: 1.0000 | ORAL_TABLET | Freq: Two times a day (BID) | ORAL | Status: DC

## 2013-02-18 NOTE — Progress Notes (Signed)
Patient ID: Debra Miller, female   DOB: 1981-01-25, 32 y.o.   MRN: [id number]                    PCP Kriste Basque,  MD 32 y.o.   Married    Caucasian   female   [phone]   here for annual exam.   Patient is actively in treatment with her PCP for multiple medical issues - chest pain and palpitations, leg swelling, shortness of breath, depression and anxiety.  Having heavy and irregular cycles with cramps and clotting.  Started 3 - 4 years ago Puts the patient into her bed.  "My life stops." Using over the counter meds.  Has menstrual migraines with nausea and vomiting - has aura. Painful breasts during menses goes under her arms.   Energy is depleted during menses.  Tampon changes about every hour during the heavies day. Can have bleeding in between cycles.   Cramps feel like a contraction.  Notes dysuria for one month.  Having frequency of urination and bladder control problems daily.   Leaks with cough, sneeze, laugh or doing nothing at all.  Has gained weight due to depression. Drinks several caffeine containing colas per day.   Having some chest palpitations and chest pains - sharp sternal and along chest wall.    Elevated cholesterol and triglycerides.  Hgb 11.6, normal TSH 01/29/13.  Patient's last menstrual period was 01/18/2013.          Sexually active: yes  The current method of family planning is tubal ligation.    Exercising: no Last mammogram:  never Last pap smear:04/2007 abnormal History of abnormal pap: 2006 hx of colposcopy with cryotherapy to cervix with pap smears every six months but last pap was 04/2007 and it was not normal. Smoking: no Alcohol: no Last colonoscopy: never Last Bone Density:  never Last tetanus shot: 01/2013 Last cholesterol check: 01/2013 with PCP  Urine: 1+ WBC's   Family History  Problem Relation Age of Onset  . Arthritis Mother   . High Cholesterol Mother   . Hypertension Mother   . Diabetes Mother   . Rheum arthritis Mother   .  Asthma Mother   . Hyperlipidemia Mother   . Migraines Mother   . Thyroid disease Mother   . Arthritis Maternal Grandmother   . Breast cancer Maternal Grandmother   . High Cholesterol Maternal Grandmother   . Stroke Maternal Grandmother   . Hypertension Maternal Grandmother   . Migraines Maternal Grandmother     Patient Active Problem List   Diagnosis Date Noted  . Bipolar disorder, unspecified 01/28/2013  . Migraines 01/28/2013  . Irregular periods 01/28/2013  . Morbid obesity 01/28/2013    Past Medical History  Diagnosis Date  . Asthma   . Chicken pox   . Depression   . Bipolar disorder   . Dizziness   . Frequent headaches   . GERD (gastroesophageal reflux disease)   . Seasonal allergies   . Migraines   . Urine incontinence   . UTI (urinary tract infection)   . H/O cold sores   . Eczema   . Hypertension     high blood pressure readings   . Dysmenorrhea     Past Surgical History  Procedure Laterality Date  . Tonsillectomy and adenoidectomy    . Tubal ligation      Allergies: Ceclor and Pediapred  No current outpatient prescriptions on file.   No current facility-administered medications for this  visit.    ROS: Pertinent items are noted in HPI.  Social Hx:  Married. 5 children - 1 boy and 4 girls.  Exam:    BP 124/80  Pulse 74  Ht 5' 3.5" (1.613 m)  Wt 268 lb (121.564 kg)  BMI 46.72 kg/m2  LMP 01/18/2013   Wt Readings from Last 3 Encounters:  02/18/13 268 lb (121.564 kg)  01/28/13 268 lb (121.564 kg)     Ht Readings from Last 3 Encounters:  02/18/13 5' 3.5" (1.613 m)  01/28/13 5' 3.5" (1.613 m)    General appearance: alert, cooperative and appears stated age Head: Normocephalic, without obvious abnormality, atraumatic Neck: no adenopathy, supple, symmetrical, trachea midline and thyroid not enlarged, symmetric, no tenderness/mass/nodules Lungs: clear to auscultation bilaterally Breasts: Inspection negative, No nipple retraction or dimpling,  No nipple discharge or bleeding, No axillary or supraclavicular adenopathy, Normal to palpation without dominant masses Heart: regular rate and rhythm Abdomen: obesity, soft, non-tender;  no masses,  no organomegaly Extremities: extremities normal, atraumatic, no cyanosis or edema Skin: Skin color, texture, turgor normal. No rashes or lesions Lymph nodes: Cervical, supraclavicular, and axillary nodes normal. No abnormal inguinal nodes palpated Neurologic: Grossly normal   Pelvic: External genitalia:  no lesions              Urethra:  normal appearing urethra with no masses, tenderness or lesions              Bartholins and Skenes: normal                 Vagina: normal appearing vagina with normal color and discharge, no lesions, first degree cystocele and rectocele.  Good uterine support.               Cervix: normal appearance.  Bleeds with pap.               Pap taken: yes and High risk HPV        Bimanual Exam:  Uterus:  uterus is normal size, shape, consistency and nontender - exam limited by body habitus                                      Adnexa: normal adnexa in size, nontender and no masses                                      Rectovaginal: Confirms                                      Anus:  normal sphincter tone, no lesions  Assessment  UTI History of cryotherapy to cervix Menorrhagia Dysmenorrhea Mixed incontinence Incomplete uterovaginal prolapse Status post bilateral tubal ligation  Plan  Urine culture Pap and high risk HPV testing. Get results of colposcopy from prior office. Bactrim DS.  See Epic orders. Return for pelvic ultrasound, saline ultrasound, potential endometrial biopsy. Discussed weight loss, dietary changes, pelvic floor therapy, and potential surgery to treat incontinence.    An After Visit Summary was printed and given to the patient.

## 2013-02-18 NOTE — Patient Instructions (Addendum)

## 2013-02-20 LAB — IPS PAP TEST WITH HPV

## 2013-02-20 LAB — URINE CULTURE: Colony Count: 30000

## 2013-02-21 MED ORDER — AMPICILLIN 250 MG PO CAPS: 250.0000 mg | ORAL_CAPSULE | Freq: Four times a day (QID) | ORAL | Status: DC

## 2013-02-21 NOTE — Addendum Note (Signed)
Addended by: Alphonsa Overall on: 02/21/2013 02:53 PM   Modules accepted: Orders

## 2013-03-01 ENCOUNTER — Encounter: Payer: Self-pay | Admitting: Family Medicine

## 2013-03-01 ENCOUNTER — Ambulatory Visit (INDEPENDENT_AMBULATORY_CARE_PROVIDER_SITE_OTHER): Payer: BC Managed Care – PPO | Admitting: Family Medicine

## 2013-03-01 VITALS — BP 124/80 | Temp 98.2°F | Wt 268.0 lb

## 2013-03-01 DIAGNOSIS — Z23 Encounter for immunization: Secondary | ICD-10-CM

## 2013-03-01 DIAGNOSIS — F319 Bipolar disorder, unspecified: Secondary | ICD-10-CM

## 2013-03-01 DIAGNOSIS — L708 Other acne: Secondary | ICD-10-CM

## 2013-03-01 DIAGNOSIS — L709 Acne, unspecified: Secondary | ICD-10-CM

## 2013-03-01 DIAGNOSIS — G43909 Migraine, unspecified, not intractable, without status migrainosus: Secondary | ICD-10-CM

## 2013-03-01 DIAGNOSIS — E785 Hyperlipidemia, unspecified: Secondary | ICD-10-CM

## 2013-03-01 DIAGNOSIS — N926 Irregular menstruation, unspecified: Secondary | ICD-10-CM

## 2013-03-01 NOTE — Patient Instructions (Signed)
-  needs to establish with psychiatry  -keep a headache journal to look for food triggers -use tylenol 1000mg  up to twice daily or ibuprofen 400mg  - but do not use these more then 2x per week -topical menthol (tiger balm)  We recommend the following healthy lifestyle measures: - eat a healthy diet consisting of lots of vegetables, fruits, beans, nuts, seeds, healthy meats such as white chicken and fish and whole grains.  - avoid fried foods, fast food, processed foods, sodas, red meet and other fattening foods.  - get a least 150 minutes of aerobic exercise per week.   Wear cock up brace at night  Follow up with me after you see psychiatrist or in 3-4

## 2013-03-01 NOTE — Progress Notes (Signed)
Chief Complaint  Patient presents with  . Follow-up    HPI:  Follow up:  Borderline HTN/mild HLD: -advised lifestyle recs -walking daily; working on diet   Migraines: -advised seeing psych for bipolar and keeping journal last visit, then decide on mediction depending on psych meds -has not seen psych - she will, but working on finances -no SI, no thought of self harm -headaches unchanged  CTS: -cock up brace given 1 month ago -has not been using the brace  Acne: -advised benzoyl peroxide wash last visit -going well -has helped a lot, also using cetaphil  irr menstrual bleeding/urinary incontinence: -saw gyn for this and had pap - following up next week for [place] and biopsy  Health maintenance: -offered flu vaccine - wants to get today  ROS: See pertinent positives and negatives per HPI.  Past Medical History  Diagnosis Date  . Asthma   . Chicken pox   . Depression   . Bipolar disorder   . Dizziness   . Frequent headaches   . GERD (gastroesophageal reflux disease)   . Seasonal allergies   . Migraines   . Urine incontinence   . UTI (urinary tract infection)   . H/O cold sores   . Eczema   . Hypertension     high blood pressure readings   . Dysmenorrhea     Past Surgical History  Procedure Laterality Date  . Tonsillectomy and adenoidectomy    . Tubal ligation      Family History  Problem Relation Age of Onset  . Arthritis Mother   . High Cholesterol Mother   . Hypertension Mother   . Diabetes Mother   . Rheum arthritis Mother   . Asthma Mother   . Hyperlipidemia Mother   . Migraines Mother   . Thyroid disease Mother   . Arthritis Maternal Grandmother   . Breast cancer Maternal Grandmother   . High Cholesterol Maternal Grandmother   . Stroke Maternal Grandmother   . Hypertension Maternal Grandmother   . Migraines Maternal Grandmother     History   Social History  . Marital Status: Married    Spouse Name: N/A    Number of Children: N/A  .  Years of Education: N/A   Social History Main Topics  . Smoking status: Never Smoker   . Smokeless tobacco: None  . Alcohol Use: No  . Drug Use: None  . Sexual Activity: Yes    Birth Control/ Protection: Surgical     Comment: Tubal   Other Topics Concern  . None   Social History Narrative   Work or School: homemaker      Home Situation: lives with husband and 5 kids (ages 6-14 in 2014)      Spiritual Beliefs: Christian      Lifestyle: no regular exercise, poor diet, lots of soda             No current outpatient prescriptions on file.  EXAM:  Filed Vitals:   03/01/13 1013  BP: 124/80  Temp: 98.2 F (36.8 C)    Body mass index is 46.72 kg/(m^2).  GENERAL: vitals reviewed and listed above, alert, oriented, appears well hydrated and in no acute distress  HEENT: atraumatic, conjunttiva clear, no obvious abnormalities on inspection of external nose and ears  NECK: no obvious masses on inspection  LUNGS: clear to auscultation bilaterally, no wheezes, rales or rhonchi, good air movement  CV: HRRR, no peripheral edema  MS: moves all extremities without noticeable abnormality  PSYCH: pleasant and cooperative, no obvious depression or anxiety  ASSESSMENT AND PLAN:  Discussed the following assessment and plan:  Bipolar disorder, unspecified  Migraines  Irregular periods  Morbid obesity  Acne  Hyperlipemia  -she is about the same, has not followed prior recommendations -she reports she will see psych about bipolar disorder and then will follow up with me for migraine prophylaxis if needed - other recs for headaches per below -occ use of analgesics, but warned of daily rebound headaches with regular use of these -offered triptans, but headaches frequent and she prefers to defer this -diet and exercise for obesity and HLD -she is seeing gyn for urinary and menstrual issues and had pap -acne improved -has not tried brace for CTS, same - advised to try  brace  -Patient advised to return or notify a doctor immediately if symptoms worsen or persist or new concerns arise.  Patient Instructions  -needs to establish with psychiatry  -keep a headache journal to look for food triggers -use tylenol 1000mg  up to twice daily or ibuprofen 400mg  - but do not use these more then 2x per week -topical menthol (tiger balm)  We recommend the following healthy lifestyle measures: - eat a healthy diet consisting of lots of vegetables, fruits, beans, nuts, seeds, healthy meats such as white chicken and fish and whole grains.  - avoid fried foods, fast food, processed foods, sodas, red meet and other fattening foods.  - get a least 150 minutes of aerobic exercise per week.   Wear cock up brace at night  Follow up with me after you see psychiatrist or in 3-4       KIM, HANNAH R.

## 2013-03-07 ENCOUNTER — Ambulatory Visit (INDEPENDENT_AMBULATORY_CARE_PROVIDER_SITE_OTHER): Payer: BC Managed Care – PPO

## 2013-03-07 ENCOUNTER — Ambulatory Visit (INDEPENDENT_AMBULATORY_CARE_PROVIDER_SITE_OTHER): Payer: BC Managed Care – PPO | Admitting: Obstetrics and Gynecology

## 2013-03-07 ENCOUNTER — Encounter: Payer: Self-pay | Admitting: Obstetrics and Gynecology

## 2013-03-07 VITALS — BP 140/86 | HR 70 | Ht 63.5 in | Wt 266.0 lb

## 2013-03-07 DIAGNOSIS — N946 Dysmenorrhea, unspecified: Secondary | ICD-10-CM

## 2013-03-07 DIAGNOSIS — N920 Excessive and frequent menstruation with regular cycle: Secondary | ICD-10-CM

## 2013-03-07 DIAGNOSIS — E785 Hyperlipidemia, unspecified: Secondary | ICD-10-CM

## 2013-03-07 MED ORDER — NORETHINDRONE 0.35 MG PO TABS: 1.0000 | ORAL_TABLET | Freq: Every day | ORAL | Status: DC

## 2013-03-07 NOTE — Progress Notes (Signed)
Subjective  Patient is here for pelvic ultrasound and sonohysterogram.  Had labs for abnormal uterine bleeding last visit.  Normal TSH.  Elevated triglycerides.   Saw PCP and did not get a lot of direction for her elevated triglycerides.  Status post BTL. Status post cryotherapy to cervix.   History of migraines with aura.  Pap 02/20/13 - negative, negative high risk HPV.  Objective  Normal pelvic ultrasound.  No fibroids.  No polyps.  Normal ovaries.  Sonohysterogram performed and no intracavitary masses were noted.  Assessment.  Menorrhagia. Dysmenorrhea.  Obesity. Hypertriglyceridemia.  Plan  Discussed options for treatment of pain and bleeding.  Discussed progesterone only birth control pills, Mirena IUD, and endometrial ablation.  Will do progesterone only pills.  Usage and side effects discussed.  See Epic orders. I discussed at length proper diet and weight loss with the patient.   I gave her references such as the [employer] Association for further reading.  I recommend the patient have a recheck with her PCP in 6 months for reassessment of her lipid status.   Recheck here in 8 weeks.

## 2013-03-07 NOTE — Patient Instructions (Addendum)
Norethindrone tablets (contraception) What is this medicine? NORETHINDRONE is an oral contraceptive. The product contains a female hormone known as a progestin. It is used to prevent pregnancy. This medicine may be used for other purposes; ask your health care provider or pharmacist if you have questions. What should I tell my health care provider before I take this medicine? They need to know if you have any of these conditions: -blood vessel disease or blood clots -breast, cervical, or vaginal cancer -diabetes -heart disease -kidney disease -liver disease -mental depression -migraine -seizures -stroke -vaginal bleeding -an unusual or allergic reaction to norethindrone, other medicines, foods, dyes, or preservatives -pregnant or trying to get pregnant -breast-feeding How should I use this medicine? Take this medicine by mouth with a glass of water. You may take it with or without food. Follow the directions on the prescription label. Take this medicine at the same time each day and in the order directed on the package. Do not take your medicine more often than directed. Contact your pediatrician regarding the use of this medicine in children. Special care may be needed. This medicine has been used in female children who have started having menstrual periods. A patient package insert for the product will be given with each prescription and refill. Read this sheet carefully each time. The sheet may change frequently. Overdosage: If you think you have taken too much of this medicine contact a poison control center or emergency room at once. NOTE: This medicine is only for you. Do not share this medicine with others. What if I miss a dose? Try not to miss a dose. Every time you miss a dose or take a dose late your chance of pregnancy increases. When 1 pill is missed (even if only 3 hours late), take the missed pill as soon as possible and continue taking a pill each day at the regular time  (use a back up method of birth control for the next 48 hours). If more than 1 dose is missed, use an additional birth control method for the rest of your pill pack until menses occurs. Contact your health care professional if more than 1 dose has been missed. What may interact with this medicine? Do not take this medicine with any of the following medications: -amprenavir or fosamprenavir -bosentan This medicine may also interact with the following medications: -antibiotics or medicines for infections, especially rifampin, rifabutin, rifapentine, and griseofulvin, and possibly penicillins or tetracyclines -aprepitant -barbiturate medicines, such as phenobarbital -carbamazepine -felbamate -modafinil -oxcarbazepine -phenytoin -ritonavir or other medicines for HIV infection or AIDS -St. John's wort -topiramate This list may not describe all possible interactions. Give your health care provider a list of all the medicines, herbs, non-prescription drugs, or dietary supplements you use. Also tell them if you smoke, drink alcohol, or use illegal drugs. Some items may interact with your medicine. What should I watch for while using this medicine? Visit your doctor or health care professional for regular checks on your progress. You will need a regular breast and pelvic exam and Pap smear while on this medicine. Use an additional method of birth control during the first cycle that you take these tablets. If you have any reason to think you are pregnant, stop taking this medicine right away and contact your doctor or health care professional. If you are taking this medicine for hormone related problems, it may take several cycles of use to see improvement in your condition. This medicine does not protect you against HIV infection (AIDS)  or any other sexually transmitted diseases. What side effects may I notice from receiving this medicine? Side effects that you should report to your doctor or health  care professional as soon as possible: -breast tenderness or discharge -pain in the abdomen, chest, groin or leg -severe headache -skin rash, itching, or hives -sudden shortness of breath -unusually weak or tired -vision or speech problems -yellowing of skin or eyes Side effects that usually do not require medical attention (report to your doctor or health care professional if they continue or are bothersome): -changes in sexual desire -change in menstrual flow -facial hair growth -fluid retention and swelling -headache -irritability -nausea -weight gain or loss This list may not describe all possible side effects. Call your doctor for medical advice about side effects. You may report side effects to FDA at 1-800-FDA-1088. Where should I keep my medicine? Keep out of the reach of children. Store at room temperature between 15 and 30 degrees C (59 and 86 degrees F). Throw away any unused medicine after the expiration date. NOTE: This sheet is a summary. It may not cover all possible information. If you have questions about this medicine, talk to your doctor, pharmacist, or health care provider.  2012, [employer]. (06/05/2008 1:54:05 PM)  Hypertriglyceridemia  Diet for High blood levels of Triglycerides Most fats in food are triglycerides. Triglycerides in your blood are stored as fat in your body. High levels of triglycerides in your blood may put you at a greater risk for heart disease and stroke.  Normal triglyceride levels are less than 150 mg/dL. Borderline high levels are 150-199 mg/dl. High levels are 200 - 499 mg/dL, and very high triglyceride levels are greater than 500 mg/dL. The decision to treat high triglycerides is generally based on the level. For people with borderline or high triglyceride levels, treatment includes weight loss and exercise. Drugs are recommended for people with very high triglyceride levels. Many people who need treatment for high triglyceride  levels have metabolic syndrome. This syndrome is a collection of disorders that often include: insulin resistance, high blood pressure, blood clotting problems, high cholesterol and triglycerides. TESTING PROCEDURE FOR TRIGLYCERIDES  You should not eat 4 hours before getting your triglycerides measured. The normal range of triglycerides is between 10 and 250 milligrams per deciliter (mg/dl). Some people may have extreme levels (1000 or above), but your triglyceride level may be too high if it is above 150 mg/dl, depending on what other risk factors you have for heart disease.  People with high blood triglycerides may also have high blood cholesterol levels. If you have high blood cholesterol as well as high blood triglycerides, your risk for heart disease is probably greater than if you only had high triglycerides. High blood cholesterol is one of the main risk factors for heart disease. CHANGING YOUR DIET  Your weight can affect your blood triglyceride level. If you are more than 20% above your ideal body weight, you may be able to lower your blood triglycerides by losing weight. Eating less and exercising regularly is the best way to combat this. Fat provides more calories than any other food. The best way to lose weight is to eat less fat. Only 30% of your total calories should come from fat. Less than 7% of your diet should come from saturated fat. A diet low in fat and saturated fat is the same as a diet to decrease blood cholesterol. By eating a diet lower in fat, you may lose weight, lower your blood  cholesterol, and lower your blood triglyceride level.  Eating a diet low in fat, especially saturated fat, may also help you lower your blood triglyceride level. Ask your dietitian to help you figure how much fat you can eat based on the number of calories your caregiver has prescribed for you.  Exercise, in addition to helping with weight loss may also help lower triglyceride levels.   Alcohol can  increase blood triglycerides. You may need to stop drinking alcoholic beverages.  Too much carbohydrate in your diet may also increase your blood triglycerides. Some complex carbohydrates are necessary in your diet. These may include bread, rice, potatoes, other starchy vegetables and cereals.  Reduce "simple" carbohydrates. These may include pure sugars, candy, honey, and jelly without losing other nutrients. If you have the kind of high blood triglycerides that is affected by the amount of carbohydrates in your diet, you will need to eat less sugar and less high-sugar foods. Your caregiver can help you with this.  Adding 2-4 grams of fish oil (EPA+ DHA) may also help lower triglycerides. Speak with your caregiver before adding any supplements to your regimen. Following the Diet  Maintain your ideal weight. Your caregivers can help you with a diet. Generally, eating less food and getting more exercise will help you lose weight. Joining a weight control group may also help. Ask your caregivers for a good weight control group in your area.  Eat low-fat foods instead of high-fat foods. This can help you lose weight too.  These foods are lower in fat. Eat MORE of these:   Dried beans, peas, and lentils.  Egg whites.  Low-fat cottage cheese.  Fish.  Lean cuts of meat, such as round, sirloin, rump, and flank (cut extra fat off meat you fix).  Whole grain breads, cereals and pasta.  Skim and nonfat dry milk.  Low-fat yogurt.  Poultry without the skin.  Cheese made with skim or part-skim milk, such as mozzarella, parmesan, farmers', ricotta, or pot cheese. These are higher fat foods. Eat LESS of these:   Whole milk and foods made from whole milk, such as American, blue, cheddar, monterey jack, and swiss cheese  High-fat meats, such as luncheon meats, sausages, knockwurst, bratwurst, hot dogs, ribs, corned beef, ground pork, and regular ground beef.  Fried foods. Limit saturated fats in  your diet. Substituting unsaturated fat for saturated fat may decrease your blood triglyceride level. You will need to read package labels to know which products contain saturated fats.  These foods are high in saturated fat. Eat LESS of these:   Fried pork skins.  Whole milk.  Skin and fat from poultry.  Palm oil.  Butter.  Shortening.  Cream cheese.  Tomasa Blase.  Margarines and baked goods made from listed oils.  Vegetable shortenings.  Chitterlings.  Fat from meats.  Coconut oil.  Palm kernel oil.  Lard.  Cream.  Sour cream.  Fatback.  Coffee whiteners and non-dairy creamers made with these oils.  Cheese made from whole milk. Use unsaturated fats (both polyunsaturated and monounsaturated) moderately. Remember, even though unsaturated fats are better than saturated fats; you still want a diet low in total fat.  These foods are high in unsaturated fat:   Canola oil.  Sunflower oil.  Mayonnaise.  Almonds.  Peanuts.  Pine nuts.  Margarines made with these oils.  Safflower oil.  Olive oil.  Avocados.  Cashews.  Peanut butter.  Sunflower seeds.  Soybean oil.  Peanut oil.  Olives.  Pecans.  Walnuts.  Pumpkin seeds. Avoid sugar and other high-sugar foods. This will decrease carbohydrates without decreasing other nutrients. Sugar in your food goes rapidly to your blood. When there is excess sugar in your blood, your liver may use it to make more triglycerides. Sugar also contains calories without other important nutrients.  Eat LESS of these:   Sugar, brown sugar, powdered sugar, jam, jelly, preserves, honey, syrup, molasses, pies, candy, cakes, cookies, frosting, pastries, colas, soft drinks, punches, fruit drinks, and regular gelatin.  Avoid alcohol. Alcohol, even more than sugar, may increase blood triglycerides. In addition, alcohol is high in calories and low in nutrients. Ask for sparkling water, or a diet soft drink instead of an  alcoholic beverage. Suggestions for planning and preparing meals   Bake, broil, grill or roast meats instead of frying.  Remove fat from meats and skin from poultry before cooking.  Add spices, herbs, lemon juice or vinegar to vegetables instead of salt, rich sauces or gravies.  Use a non-stick skillet without fat or use no-stick sprays.  Cool and refrigerate stews and broth. Then remove the hardened fat floating on the surface before serving.  Refrigerate meat drippings and skim off fat to make low-fat gravies.  Serve more fish.  Use less butter, margarine and other high-fat spreads on bread or vegetables.  Use skim or reconstituted non-fat dry milk for cooking.  Cook with low-fat cheeses.  Substitute low-fat yogurt or cottage cheese for all or part of the sour cream in recipes for sauces, dips or congealed salads.  Use half yogurt/half mayonnaise in salad recipes.  Substitute evaporated skim milk for cream. Evaporated skim milk or reconstituted non-fat dry milk can be whipped and substituted for whipped cream in certain recipes.  Choose fresh fruits for dessert instead of high-fat foods such as pies or cakes. Fruits are naturally low in fat. When Dining Out   Order low-fat appetizers such as fruit or vegetable juice, pasta with vegetables or tomato sauce.  Select clear, rather than cream soups.  Ask that dressings and gravies be served on the side. Then use less of them.  Order foods that are baked, broiled, poached, steamed, stir-fried, or roasted.  Ask for margarine instead of butter, and use only a small amount.  Drink sparkling water, unsweetened tea or coffee, or diet soft drinks instead of alcohol or other sweet beverages. QUESTIONS AND ANSWERS ABOUT OTHER FATS IN THE BLOOD: SATURATED FAT, TRANS FAT, AND CHOLESTEROL What is trans fat? Trans fat is a type of fat that is formed when vegetable oil is hardened through a process called hydrogenation. This process helps  makes foods more solid, gives them shape, and prolongs their shelf life. Trans fats are also called hydrogenated or partially hydrogenated oils.  What do saturated fat, trans fat, and cholesterol in foods have to do with heart disease? Saturated fat, trans fat, and cholesterol in the diet all raise the level of LDL "bad" cholesterol in the blood. The higher the LDL cholesterol, the greater the risk for coronary heart disease (CHD). Saturated fat and trans fat raise LDL similarly.  What foods contain saturated fat, trans fat, and cholesterol? High amounts of saturated fat are found in animal products, such as fatty cuts of meat, chicken skin, and full-fat dairy products like butter, whole milk, cream, and cheese, and in tropical vegetable oils such as palm, palm kernel, and coconut oil. Trans fat is found in some of the same foods as saturated fat, such as vegetable shortening, some  margarines (especially hard or stick margarine), crackers, cookies, baked goods, fried foods, salad dressings, and other processed foods made with partially hydrogenated vegetable oils. Small amounts of trans fat also occur naturally in some animal products, such as milk products, beef, and lamb. Foods high in cholesterol include liver, other organ meats, egg yolks, shrimp, and full-fat dairy products. How can I use the new food label to make heart-healthy food choices? Check the Nutrition Facts panel of the food label. Choose foods lower in saturated fat, trans fat, and cholesterol. For saturated fat and cholesterol, you can also use the Percent Daily Value (%DV): 5% DV or less is low, and 20% DV or more is high. (There is no %DV for trans fat.) Use the Nutrition Facts panel to choose foods low in saturated fat and cholesterol, and if the trans fat is not listed, read the ingredients and limit products that list shortening or hydrogenated or partially hydrogenated vegetable oil, which tend to be high in trans fat. POINTS TO  REMEMBER:   Discuss your risk for heart disease with your caregivers, and take steps to reduce risk factors.  Change your diet. Choose foods that are low in saturated fat, trans fat, and cholesterol.  Add exercise to your daily routine if it is not already being done. Participate in physical activity of moderate intensity, like brisk walking, for at least 30 minutes on most, and preferably all days of the week. No time? Break the 30 minutes into three, 10-minute segments during the day.  Stop smoking. If you do smoke, contact your caregiver to discuss ways in which they can help you quit.  Do not use street drugs.  Maintain a normal weight.  Maintain a healthy blood pressure.  Keep up with your blood work for checking the fats in your blood as directed by your caregiver. Document Released: 04/07/2004 Document Revised: 12/20/2011 Document Reviewed: 11/03/2008 [facility] Patient Information 2014 [place], [place].

## 2013-03-10 ENCOUNTER — Encounter: Payer: Self-pay | Admitting: Obstetrics and Gynecology

## 2013-03-12 ENCOUNTER — Other Ambulatory Visit: Payer: Self-pay | Admitting: Obstetrics and Gynecology

## 2013-03-12 DIAGNOSIS — N946 Dysmenorrhea, unspecified: Secondary | ICD-10-CM

## 2013-03-12 DIAGNOSIS — N920 Excessive and frequent menstruation with regular cycle: Secondary | ICD-10-CM

## 2013-03-15 ENCOUNTER — Encounter: Payer: Self-pay | Admitting: Obstetrics and Gynecology

## 2013-05-02 ENCOUNTER — Ambulatory Visit: Payer: BC Managed Care – PPO | Admitting: Obstetrics and Gynecology

## 2013-05-22 ENCOUNTER — Telehealth: Payer: Self-pay | Admitting: Obstetrics and Gynecology

## 2013-05-22 ENCOUNTER — Ambulatory Visit: Payer: BC Managed Care – PPO | Admitting: Obstetrics and Gynecology

## 2013-05-22 NOTE — Telephone Encounter (Signed)
Hi Tiffany,  I would push the appointment forward to January when the patient calls to reschedule.  Thanks!

## 2013-05-22 NOTE — Telephone Encounter (Signed)
Dr Edward Jolly Patient [facility] 8 wk reck appt 05/22/13. Tried to call but went straight to vm left message about missed appt and told her to call to reschedule or that we would try calling her to reschedule.  Felicita Can you find a spot to put her since Dr Courtney Paris schedule is closed?

## 2013-05-23 NOTE — Telephone Encounter (Signed)
I called the patient back as she called at lunch to let us know she did call and cancel the missed appointment on Monday, 05/20/13 at 4:02 PM. Patient states she spoke with a person in the front office to cancel the appointment then she would call back reschedule at a later date. [facility] fee waived for this instance. The patient declined to reschedule at this time due sharing a car with her spouse at this time.

## 2013-08-05 ENCOUNTER — Telehealth: Payer: Self-pay | Admitting: Obstetrics and Gynecology

## 2013-08-05 NOTE — Telephone Encounter (Signed)
Pt says she need an appointment for a med reck with Dr Quincy Simmonds.

## 2013-08-05 NOTE — Telephone Encounter (Signed)
Return call to patient, VM has name confirmation, LMTCB. Patient last seen 03-2013 and canceled her 8 week recheck for 05-2013.

## 2013-08-14 NOTE — Telephone Encounter (Signed)
Return call,. Husband answered. Asked him to give patient message to return call to Dr. Elza Rafter office.  States she will call us back.

## 2013-08-20 NOTE — Telephone Encounter (Signed)
Dr Quincy Simmonds, any further follow up need or can we close encounter

## 2013-08-21 NOTE — Telephone Encounter (Signed)
No further follow up needed at this time. I will close the encounter.

## 2014-02-19 ENCOUNTER — Ambulatory Visit (INDEPENDENT_AMBULATORY_CARE_PROVIDER_SITE_OTHER): Payer: BC Managed Care – PPO | Admitting: Obstetrics and Gynecology

## 2014-02-19 ENCOUNTER — Encounter: Payer: Self-pay | Admitting: Obstetrics and Gynecology

## 2014-02-19 VITALS — BP 124/80 | HR 68 | Resp 20 | Ht 63.5 in | Wt 272.4 lb

## 2014-02-19 DIAGNOSIS — Z0000 Encounter for general adult medical examination without abnormal findings: Secondary | ICD-10-CM

## 2014-02-19 DIAGNOSIS — N921 Excessive and frequent menstruation with irregular cycle: Secondary | ICD-10-CM

## 2014-02-19 DIAGNOSIS — Z01419 Encounter for gynecological examination (general) (routine) without abnormal findings: Secondary | ICD-10-CM

## 2014-02-19 DIAGNOSIS — N920 Excessive and frequent menstruation with regular cycle: Secondary | ICD-10-CM

## 2014-02-19 DIAGNOSIS — N3946 Mixed incontinence: Secondary | ICD-10-CM

## 2014-02-19 LAB — HEMOGLOBIN, FINGERSTICK: Hemoglobin, fingerstick: 14 g/dL (ref 12.0–16.0)

## 2014-02-19 MED ORDER — FLUCONAZOLE 150 MG PO TABS: 150.0000 mg | ORAL_TABLET | Freq: Once | ORAL | Status: DC

## 2014-02-19 NOTE — Progress Notes (Addendum)
Patient ID: Debra Miller, female   DOB: 08/28/1980, 33 y.o.   MRN: [id number] GYNECOLOGY VISIT  PCP:   Colin Benton, DO  Referring provider:   HPI: 33 y.o.   Married  Caucasian  female   [phone] with Patient's last menstrual period was 02/14/2014.   here for  AEX.   Menses irregular on Camilla.  Acne.  Missed a couple of pills.  "I'm done with the bleeding."  Some breast tenderness bilaterally.   Normal pelvic ultrasound and sonohysterogram in 2014.  Status post BTL.  Status post cryotherapy to cervix.   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  Gaining weight.  Stopped sodas.  Wants to go to the gym.   Multiple complaints of shortness of breath with activity.  Chest pains, leg swelling, wheezing, muscle weakness.  Leaks with cough and sneeze.  Some urgency.  Gave up Diet Pepper.   Some vaginal  Itching off and on.  Sometimes vaginal pain.   Hgb:   14.0 Urine:  Unable to void  GYNECOLOGIC HISTORY: Patient's last menstrual period was 02/14/2014. Sexually active:  yes Partner preference: female Contraception:   Tubal/OCP's--Micronor Menopausal hormone therapy: n/a DES exposure:   no Blood transfusions:  no Sexually transmitted diseases:   no GYN procedures and prior surgeries:  Tubal Ligation Last mammogram:  n/a               Last pap and high risk HPV testing:  02-18-13 wnl:neg HR HPV  History of abnormal pap smear:  LEEP 2006 CIN 3.  Pap in 10/08 abnormal but pap 02/2013 normal.   OB History   Grav Para Term Preterm Abortions TAB SAB Ect Mult Living   7 5 5  2  2   5        LIFESTYLE: Exercise:  Just joined a Gym              OTHER HEALTH MAINTENANCE: Tetanus/TDap:  2014 HPV:                 no Influenza:          04/2013   Bone density:  n/a Colonoscopy: n/a  Cholesterol check:  With PCP: T 199, Trig 372, HDL 29, VLDL 74.4  Family History  Problem Relation Age of Onset  . Arthritis Mother   . High Cholesterol Mother   .  Hypertension Mother   . Diabetes Mother   . Rheum arthritis Mother   . Asthma Mother   . Hyperlipidemia Mother   . Migraines Mother   . Thyroid disease Mother   . Arthritis Maternal Grandmother   . Breast cancer Maternal Grandmother   . High Cholesterol Maternal Grandmother   . Stroke Maternal Grandmother   . Hypertension Maternal Grandmother   . Migraines Maternal Grandmother     Patient Active Problem List   Diagnosis Date Noted  . Bipolar disorder, unspecified 01/28/2013  . Migraines 01/28/2013  . Irregular periods 01/28/2013  . Morbid obesity 01/28/2013   Past Medical History  Diagnosis Date  . Asthma   . Chicken pox   . Depression   . Bipolar disorder   . Dizziness   . Frequent headaches   . GERD (gastroesophageal reflux disease)   . Seasonal allergies   . Migraines     with aura  . Urine incontinence   . UTI (urinary tract infection)   . H/O cold sores   . Eczema   .  Hypertension     high blood pressure readings   . Dysmenorrhea   . 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 Tonsillectomy and adenoidectomy    . Tubal ligation      ALLERGIES: Ceclor and Pediapred  Current Outpatient Prescriptions  Medication Sig Dispense Refill  . norethindrone (MICRONOR,CAMILA,ERRIN) 0.35 MG tablet Take 1 tablet (0.35 mg total) by mouth daily.  3 Package  3   No current facility-administered medications for this visit.     ROS:  Pertinent items are noted in HPI.  History   Social History  . Marital Status: Married    Spouse Name: N/A    Number of Children: N/A  . Years of Education: N/A   Occupational History  . Not on file.   Social History Main Topics  . Smoking status: Never Smoker   . Smokeless tobacco: Not on file  . Alcohol Use: No  . Drug Use: No  . Sexual Activity: Yes    Partners: Male    Birth Control/ Protection: Surgical     Comment: Tubal    Other Topics Concern  . Not on file   Social History Narrative   Work or School: homemaker      Home Situation: lives with husband and 5 kids (ages 19-14 in 2014)      Spiritual Beliefs: Christian      Lifestyle: no regular exercise, poor diet, lots of soda             PHYSICAL EXAMINATION:    BP 124/80  Pulse 68  Resp 20  Ht 5' 3.5" (1.613 m)  Wt 272 lb 6.4 oz (123.56 kg)  BMI 47.49 kg/m2  LMP 02/14/2014   Wt Readings from Last 3 Encounters:  02/19/14 272 lb 6.4 oz (123.56 kg)  03/07/13 266 lb (120.657 kg)  03/01/13 268 lb (121.564 kg)     Ht Readings from Last 3 Encounters:  02/19/14 5' 3.5" (1.613 m)  03/07/13 5' 3.5" (1.613 m)  02/18/13 5' 3.5" (1.613 m)    General appearance: alert, cooperative and appears stated age Head: Normocephalic, without obvious abnormality, atraumatic Neck: no adenopathy, supple, symmetrical, trachea midline and thyroid not enlarged, symmetric, no tenderness/mass/nodules Lungs: clear to auscultation bilaterally Breasts: Inspection negative, No nipple retraction or dimpling, No nipple discharge or bleeding, No axillary or supraclavicular adenopathy, Normal to palpation without dominant masses Heart: regular rate and rhythm Abdomen: obese, soft, non-tender; no masses,  no organomegaly Extremities: extremities normal, atraumatic, no cyanosis or edema Skin: Skin color, texture, turgor normal. No rashes or lesions Lymph nodes: Cervical, supraclavicular, and axillary nodes normal. No abnormal inguinal nodes palpated Neurologic: Grossly normal  Pelvic: External genitalia:  no lesions              Urethra:  normal appearing urethra with no masses, tenderness or lesions              Bartholins and Skenes: normal                 Vagina: normal appearing vagina with normal color and discharge, no lesions, first degree cystocele and rectocele.               Cervix: normal appearance              Pap and high risk HPV testing done: No.         Bimanual Exam:  Uterus:  uterus is normal size, shape, consistency and nontender                                      Adnexa: normal adnexa in size, nontender and no masses                                      Rectovaginal:  No.                           ASSESSMENT  Normal gynecologic exam. History of LEEP in 2006 for CIN III. Irregular menses on Camilla. Genuine stress incontinence.  Incomplete uterovaginal prolapse.  Status post BTL. Morbid obesity.    PLAN  Mammogram recommended yearly starting at age 71. Pap smear and high risk HPV testing as above. Counseled on self breast exam, Calcium and vitamin D intake, exercise. Stop Camilla.  I discussed a Mirena IUD with patient and gave written information.  Will precert.  See lab orders: No. I discussed weight loss through diet and exercise.  Surgery for prolapse and incontinence will have less risk of complications and greater success with significant weight loss first.  I gave the patient an initial goal of weight to be 200 pounds.  She agrees.  Patient will follow up with her PCP regarding her cardio-respiratory complaints and lower extremity swelling.  Return annually or prn.   An After Visit Summary was printed and given to the patient.

## 2014-02-19 NOTE — Patient Instructions (Signed)
EXERCISE AND DIET:  We recommended that you start or continue a regular exercise program for good health. Regular exercise means any activity that makes your heart beat faster and makes you sweat.  We recommend exercising at least 30 minutes per day at least 3 days a week, preferably 4 or 5.  We also recommend a diet low in fat and sugar.  Inactivity, poor dietary choices and obesity can cause diabetes, heart attack, stroke, and kidney damage, among others.    ALCOHOL AND SMOKING:  Women should limit their alcohol intake to no more than 7 drinks/beers/glasses of wine (combined, not each!) per week. Moderation of alcohol intake to this level decreases your risk of breast cancer and liver damage. And of course, no recreational drugs are part of a healthy lifestyle.  And absolutely no smoking or even second hand smoke. Most people know smoking can cause heart and lung diseases, but did you know it also contributes to weakening of your bones? Aging of your skin?  Yellowing of your teeth and nails?  CALCIUM AND VITAMIN D:  Adequate intake of calcium and Vitamin D are recommended.  The recommendations for exact amounts of these supplements seem to change often, but generally speaking 600 mg of calcium (either carbonate or citrate) and 800 units of Vitamin D per day seems prudent. Certain women may benefit from higher intake of Vitamin D.  If you are among these women, your doctor will have told you during your visit.    PAP SMEARS:  Pap smears, to check for cervical cancer or precancers,  have traditionally been done yearly, although recent scientific advances have shown that most women can have pap smears less often.  However, every woman still should have a physical exam from her gynecologist every year. It will include a breast check, inspection of the vulva and vagina to check for abnormal growths or skin changes, a visual exam of the cervix, and then an exam to evaluate the size and shape of the uterus and  ovaries.  And after 32 years of age, a rectal exam is indicated to check for rectal cancers. We will also provide age appropriate advice regarding health maintenance, like when you should have certain vaccines, screening for sexually transmitted diseases, bone density testing, colonoscopy, mammograms, etc.   MAMMOGRAMS:  All women over 40 years old should have a yearly mammogram. Many facilities now offer a "3D" mammogram, which may cost around $50 extra out of pocket. If possible,  we recommend you accept the option to have the 3D mammogram performed.  It both reduces the number of women who will be called back for extra views which then turn out to be normal, and it is better than the routine mammogram at detecting truly abnormal areas.    COLONOSCOPY:  Colonoscopy to screen for colon cancer is recommended for all women at age 50.  We know, you hate the idea of the prep.  We agree, BUT, having colon cancer and not knowing it is worse!!  Colon cancer so often starts as a polyp that can be seen and removed at colonscopy, which can quite literally save your life!  And if your first colonoscopy is normal and you have no family history of colon cancer, most women don't have to have it again for 10 years.  Once every ten years, you can do something that may end up saving your life, right?  We will be happy to help you get it scheduled when you are ready.    Be sure to check your insurance coverage so you understand how much it will cost.  It may be covered as a preventative service at no cost, but you should check your particular policy.     Exercise to Lose Weight Exercise and a healthy diet may help you lose weight. Your doctor may suggest specific exercises. EXERCISE IDEAS AND TIPS  Choose low-cost things you enjoy doing, such as walking, bicycling, or exercising to workout videos.  Take stairs instead of the elevator.  Walk during your lunch break.  Park your car further away from work or  school.  Go to a gym or an exercise class.  Start with 5 to 10 minutes of exercise each day. Build up to 30 minutes of exercise 4 to 6 days a week.  Wear shoes with good support and comfortable clothes.  Stretch before and after working out.  Work out until you breathe harder and your heart beats faster.  Drink extra water when you exercise.  Do not do so much that you hurt yourself, feel dizzy, or get very short of breath. Exercises that burn about 150 calories:  Running 1  miles in 15 minutes.  Playing volleyball for 45 to 60 minutes.  Washing and waxing a car for 45 to 60 minutes.  Playing touch football for 45 minutes.  Walking 1  miles in 35 minutes.  Pushing a stroller 1  miles in 30 minutes.  Playing basketball for 30 minutes.  Raking leaves for 30 minutes.  Bicycling 5 miles in 30 minutes.  Walking 2 miles in 30 minutes.  Dancing for 30 minutes.  Shoveling snow for 15 minutes.  Swimming laps for 20 minutes.  Walking up stairs for 15 minutes.  Bicycling 4 miles in 15 minutes.  Gardening for 30 to 45 minutes.  Jumping rope for 15 minutes.  Washing windows or floors for 45 to 60 minutes. Document Released: 07/23/2010 Document Revised: 09/12/2011 Document Reviewed: 07/23/2010 [employer] Patient Information 2015 [employer]. This information is not intended to replace advice given to you by your health care provider. Make sure you discuss any questions you have with your health care provider.  

## 2014-02-27 ENCOUNTER — Telehealth: Payer: Self-pay

## 2014-02-27 ENCOUNTER — Telehealth: Payer: Self-pay | Admitting: Obstetrics and Gynecology

## 2014-02-27 NOTE — Telephone Encounter (Signed)
Message copied by Lowella Fairy on Thu Feb 27, 2014  9:34 AM ------      Message from: [place], [place]      Created: Fri Feb 21, 2014  5:20 PM      Regarding: needs pap smear       Hi Sorah Falkenstein.            Will you please contact Salah and have her come in for a pap only?      I re-reviewed her chart, and she had a LEEP in 2006 for CIN III.      Her pap last year was normal, but we need to do it yearly.             Thanks.            Josefa Half ------

## 2014-02-27 NOTE — Telephone Encounter (Signed)
Called pt and LMOVM to call me to discuss message from Dr. Quincy Simmonds.

## 2014-02-27 NOTE — Telephone Encounter (Signed)
Left message for patient to call back. Need to go over IUD benefits °

## 2014-02-27 NOTE — Telephone Encounter (Signed)
Spoke with patient. Advised that per benefits quote received, IUD and insertion is covered at 100% after a $10 copay. There will be $10 patient liability. Patient is to call within the first 5 days of her cycle to schedule insertion.

## 2014-02-27 NOTE — Telephone Encounter (Signed)
Spoke with patient and scheduled appt for pap on 03-17-14.

## 2014-03-03 ENCOUNTER — Other Ambulatory Visit: Payer: Self-pay | Admitting: Obstetrics and Gynecology

## 2014-03-17 ENCOUNTER — Telehealth: Payer: Self-pay | Admitting: Obstetrics and Gynecology

## 2014-03-17 ENCOUNTER — Ambulatory Visit: Payer: BC Managed Care – PPO | Admitting: Obstetrics and Gynecology

## 2014-03-17 NOTE — Telephone Encounter (Signed)
Patient canceled her appointment today "msg on answering machine 03/16/14 @8 :59 pm" due to car trouble. I left message for patient to call and reschedule.

## 2014-03-18 NOTE — Telephone Encounter (Signed)
Please contact patient as per our conversation regarding my recommendation to return for her pap smear.  She will have laboratory charges and pap collection fee but not an office visit.   Cc - Thora Lance

## 2014-03-18 NOTE — Telephone Encounter (Signed)
I left patient another message for patient to call and reschedule pap smear.

## 2014-03-18 NOTE — Telephone Encounter (Signed)
Call to patient, LMTCB

## 2014-04-25 ENCOUNTER — Telehealth: Payer: Self-pay | Admitting: Obstetrics and Gynecology

## 2014-04-25 NOTE — Telephone Encounter (Signed)
error 

## 2014-05-05 ENCOUNTER — Encounter: Payer: Self-pay | Admitting: Obstetrics and Gynecology

## 2014-05-23 ENCOUNTER — Telehealth: Payer: Self-pay | Admitting: Obstetrics and Gynecology

## 2014-05-23 NOTE — Telephone Encounter (Signed)
Left message regarding upcoming appointment has been canceled and needs to be rescheduled. °

## 2014-06-10 ENCOUNTER — Telehealth: Payer: Self-pay | Admitting: *Deleted

## 2014-06-10 NOTE — Telephone Encounter (Addendum)
See previous phone note from 02-27-14 and 03-18-14 Follow up call to patient.  Advised calling to follow up on recommendation for pap only visit. Patient agreeable to schedule, appointment for 06-18-14 at 10 with Dr Quincy Simmonds.  Routing to provider for final review. Patient agreeable to disposition. Will close encounter

## 2014-06-10 NOTE — Telephone Encounter (Addendum)
See next phone note. appt scheduled for 06-18-14. Encounter closed.

## 2014-06-10 NOTE — Telephone Encounter (Signed)
Opened in error.  Routing to provider for final review. Will close encounter

## 2014-06-18 ENCOUNTER — Encounter: Payer: Self-pay | Admitting: Obstetrics and Gynecology

## 2014-06-18 ENCOUNTER — Ambulatory Visit (INDEPENDENT_AMBULATORY_CARE_PROVIDER_SITE_OTHER): Payer: BC Managed Care – PPO | Admitting: Obstetrics and Gynecology

## 2014-06-18 VITALS — BP 130/78 | HR 70 | Ht 63.5 in | Wt 271.4 lb

## 2014-06-18 DIAGNOSIS — R350 Frequency of micturition: Secondary | ICD-10-CM

## 2014-06-18 DIAGNOSIS — IMO0001 Reserved for inherently not codable concepts without codable children: Secondary | ICD-10-CM

## 2014-06-18 DIAGNOSIS — Z124 Encounter for screening for malignant neoplasm of cervix: Secondary | ICD-10-CM

## 2014-06-18 LAB — POCT URINALYSIS DIPSTICK
BILIRUBIN UA: NEGATIVE
Blood, UA: NEGATIVE
GLUCOSE UA: NEGATIVE
Ketones, UA: NEGATIVE
Leukocytes, UA: NEGATIVE
Nitrite, UA: NEGATIVE
Protein, UA: NEGATIVE
UROBILINOGEN UA: NEGATIVE
pH, UA: 5

## 2014-06-18 NOTE — Progress Notes (Signed)
Patient ID: Debra Miller, female   DOB: 07-01-1981, 33 y.o.   MRN: [id number] GYNECOLOGY  VISIT   HPI: 33 y.o.   Married  Caucasian  female   [phone] with Patient's last menstrual period was 06/03/2014 (exact date).   here for   Pap only.   Has general abdominal cramping throughout abdomen that comes and goes.   Feels like a period cramp and is intense but is not occurring during her cycle.  Regular bowel movements.   Menses are heavy and can last 9 days.   Lower extremity edema.  Establishing care with a new PCP.   Voiding often.  No dysuria.  Drinks water with lemon. Will check urine dip today.   GYNECOLOGIC HISTORY: Patient's last menstrual period was 06/03/2014 (exact date). Contraception:  Tubal Menopausal hormone therapy: n/a       OB History    Gravida Para Term Preterm AB TAB SAB Ectopic Multiple Living   7 5 5  2  2   5          Patient Active Problem List   Diagnosis Date Noted  . Bipolar disorder, unspecified 01/28/2013  . Migraines 01/28/2013  . Irregular periods 01/28/2013  . Morbid obesity 01/28/2013    Past Medical History  Diagnosis Date  . Asthma   . Chicken pox   . Depression   . Bipolar disorder   . Dizziness   . Frequent headaches   . GERD (gastroesophageal reflux disease)   . Seasonal allergies   . Migraines     with aura  . Urine incontinence   . UTI (urinary tract infection)   . H/O cold sores   . Eczema   . Hypertension     high blood pressure readings   . Dysmenorrhea   . 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 Tonsillectomy and adenoidectomy    . Tubal ligation      No current outpatient prescriptions on file.   No current facility-administered medications for this visit.     ALLERGIES: Ceclor and Pediapred  Family History  Problem Relation Age of Onset  . Arthritis Mother   . High Cholesterol Mother   . Hypertension  Mother   . Diabetes Mother   . Rheum arthritis Mother   . Asthma Mother   . Hyperlipidemia Mother   . Migraines Mother   . Thyroid disease Mother   . Arthritis Maternal Grandmother   . Breast cancer Maternal Grandmother   . High Cholesterol Maternal Grandmother   . Stroke Maternal Grandmother   . Hypertension Maternal Grandmother   . Migraines Maternal Grandmother     History   Social History  . Marital Status: Married    Spouse Name: N/A    Number of Children: N/A  . Years of Education: N/A   Occupational History  . Not on file.   Social History Main Topics  . Smoking status: Never Smoker   . Smokeless tobacco: Not on file  . Alcohol Use: No  . Drug Use: No  . Sexual Activity:    Partners: Male    Birth Control/ Protection: Surgical     Comment: Tubal   Other Topics Concern  . Not on file   Social History Narrative   Work or School: homemaker      Home Situation: lives with husband and 5 kids (ages 28-14 in 2014)  Spiritual Beliefs: Christian      Lifestyle: no regular exercise, poor diet, lots of soda             ROS:  Pertinent items are noted in HPI.  PHYSICAL EXAMINATION:    BP 130/78 mmHg  Pulse 70  Ht 5' 3.5" (1.613 m)  Wt 271 lb 6.4 oz (123.106 kg)  BMI 47.32 kg/m2  LMP 06/03/2014 (Exact Date)     General appearance: alert, cooperative and appears stated age   Abdomen: soft, non-tender; no masses,  no organomegaly   Pelvic: External genitalia:  no lesions              Urethra:  normal appearing urethra with no masses, tenderness or lesions              Bartholins and Skenes: normal                 Vagina: normal appearing vagina with normal color and discharge, no lesions              Cervix: normal appearance                   Bimanual Exam:  Uterus:  uterus is normal size, shape, consistency and nontender                                      Adnexa: normal adnexa in size, nontender and no masses                                       ASSESSMENT  Status post LEEP in 2006 for CIN III. Abdominal cramping.  I suspect bowel.  Urinary frequency.   PLAN  Pap and HR HPV testing.  Try probiotics. Urine dip:  Negative.  Return for annual exam and prn.    An After Visit Summary was printed and given to the patient.  __10____ minutes face to face time of which over 50% was spent in counseling.

## 2014-06-20 LAB — IPS PAP TEST WITH HPV

## 2015-02-25 ENCOUNTER — Ambulatory Visit: Payer: BC Managed Care – PPO | Admitting: Obstetrics and Gynecology

## 2015-03-02 ENCOUNTER — Ambulatory Visit: Payer: BC Managed Care – PPO | Admitting: Obstetrics and Gynecology

## 2015-07-07 ENCOUNTER — Ambulatory Visit: Payer: Self-pay | Admitting: Family Medicine

## 2015-07-08 ENCOUNTER — Encounter (HOSPITAL_COMMUNITY): Payer: Self-pay | Admitting: Emergency Medicine

## 2015-07-08 ENCOUNTER — Emergency Department (HOSPITAL_COMMUNITY)
Admission: EM | Admit: 2015-07-08 | Discharge: 2015-07-09 | Disposition: A | Discharge status: Home / Self Care | Payer: BLUE CROSS/BLUE SHIELD | Attending: Emergency Medicine | Admitting: Emergency Medicine

## 2015-07-08 DIAGNOSIS — S40022A Contusion of left upper arm, initial encounter: Secondary | ICD-10-CM | POA: Insufficient documentation

## 2015-07-08 DIAGNOSIS — S0993XA Unspecified injury of face, initial encounter: Secondary | ICD-10-CM | POA: Diagnosis present

## 2015-07-08 DIAGNOSIS — S00531A Contusion of lip, initial encounter: Secondary | ICD-10-CM | POA: Insufficient documentation

## 2015-07-08 DIAGNOSIS — S0991XA Unspecified injury of ear, initial encounter: Secondary | ICD-10-CM | POA: Diagnosis not present

## 2015-07-08 DIAGNOSIS — F419 Anxiety disorder, unspecified: Secondary | ICD-10-CM | POA: Insufficient documentation

## 2015-07-08 DIAGNOSIS — Y9289 Other specified places as the place of occurrence of the external cause: Secondary | ICD-10-CM | POA: Insufficient documentation

## 2015-07-08 DIAGNOSIS — K219 Gastro-esophageal reflux disease without esophagitis: Secondary | ICD-10-CM | POA: Insufficient documentation

## 2015-07-08 DIAGNOSIS — S29001A Unspecified injury of muscle and tendon of front wall of thorax, initial encounter: Secondary | ICD-10-CM | POA: Insufficient documentation

## 2015-07-08 DIAGNOSIS — S40021A Contusion of right upper arm, initial encounter: Secondary | ICD-10-CM | POA: Insufficient documentation

## 2015-07-08 DIAGNOSIS — Z862 Personal history of diseases of the blood and blood-forming organs and certain disorders involving the immune mechanism: Secondary | ICD-10-CM | POA: Insufficient documentation

## 2015-07-08 DIAGNOSIS — Y9389 Activity, other specified: Secondary | ICD-10-CM | POA: Diagnosis not present

## 2015-07-08 DIAGNOSIS — Z8742 Personal history of other diseases of the female genital tract: Secondary | ICD-10-CM | POA: Diagnosis not present

## 2015-07-08 DIAGNOSIS — S199XXA Unspecified injury of neck, initial encounter: Secondary | ICD-10-CM | POA: Insufficient documentation

## 2015-07-08 DIAGNOSIS — Z79899 Other long term (current) drug therapy: Secondary | ICD-10-CM | POA: Insufficient documentation

## 2015-07-08 DIAGNOSIS — Y998 Other external cause status: Secondary | ICD-10-CM | POA: Insufficient documentation

## 2015-07-08 DIAGNOSIS — Z872 Personal history of diseases of the skin and subcutaneous tissue: Secondary | ICD-10-CM | POA: Diagnosis not present

## 2015-07-08 DIAGNOSIS — Z8619 Personal history of other infectious and parasitic diseases: Secondary | ICD-10-CM | POA: Diagnosis not present

## 2015-07-08 DIAGNOSIS — Z87440 Personal history of urinary (tract) infections: Secondary | ICD-10-CM | POA: Insufficient documentation

## 2015-07-08 DIAGNOSIS — I10 Essential (primary) hypertension: Secondary | ICD-10-CM | POA: Insufficient documentation

## 2015-07-08 DIAGNOSIS — J45909 Unspecified asthma, uncomplicated: Secondary | ICD-10-CM | POA: Diagnosis not present

## 2015-07-08 DIAGNOSIS — S0990XA Unspecified injury of head, initial encounter: Secondary | ICD-10-CM | POA: Diagnosis not present
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
## 2015-07-09 ENCOUNTER — Emergency Department (HOSPITAL_COMMUNITY): Payer: BLUE CROSS/BLUE SHIELD

## 2015-07-09 MED ORDER — IBUPROFEN 800 MG PO TABS: 800.0000 mg | ORAL_TABLET | Freq: Three times a day (TID) | ORAL | Status: DC
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
## 2016-06-02 ENCOUNTER — Encounter (HOSPITAL_COMMUNITY): Payer: Self-pay

## 2016-06-02 ENCOUNTER — Emergency Department (HOSPITAL_COMMUNITY)
Admission: EM | Admit: 2016-06-02 | Discharge: 2016-06-02 | Disposition: A | Discharge status: Home / Self Care | Payer: BLUE CROSS/BLUE SHIELD | Attending: Emergency Medicine | Admitting: Emergency Medicine

## 2016-06-02 ENCOUNTER — Emergency Department (HOSPITAL_COMMUNITY): Payer: BLUE CROSS/BLUE SHIELD

## 2016-06-02 DIAGNOSIS — I10 Essential (primary) hypertension: Secondary | ICD-10-CM | POA: Insufficient documentation

## 2016-06-02 DIAGNOSIS — J45909 Unspecified asthma, uncomplicated: Secondary | ICD-10-CM | POA: Diagnosis not present

## 2016-06-02 DIAGNOSIS — Y929 Unspecified place or not applicable: Secondary | ICD-10-CM | POA: Insufficient documentation

## 2016-06-02 DIAGNOSIS — Y939 Activity, unspecified: Secondary | ICD-10-CM | POA: Insufficient documentation

## 2016-06-02 DIAGNOSIS — W1839XA Other fall on same level, initial encounter: Secondary | ICD-10-CM | POA: Insufficient documentation

## 2016-06-02 DIAGNOSIS — Y999 Unspecified external cause status: Secondary | ICD-10-CM | POA: Diagnosis not present

## 2016-06-02 DIAGNOSIS — M25562 Pain in left knee: Secondary | ICD-10-CM | POA: Diagnosis present

## 2016-06-02 MED ORDER — NAPROXEN 500 MG PO TABS: 500.0000 mg | ORAL_TABLET | Freq: Two times a day (BID) | ORAL | 0 refills | Status: DC

## 2016-06-02 NOTE — ED Provider Notes (Signed)
[facility] DEPT Provider Note   CSN: [id number] Arrival date & time: 06/02/16  1836 By signing my name below, I, Georgette Shell, attest that this documentation has been prepared under the direction and in the presence of Etta Quill, [place]. Electronically Signed: Georgette Shell, ED Scribe. 06/02/16. 7:20 PM.  History   Chief Complaint Chief Complaint  Patient presents with  . Fall  . Knee Pain   HPI Comments: Debra Miller is a 35 y.o. female who presents to the Emergency Department complaining of 5/10 left knee pain s/p mechanical fall just PTA. Pt states she was carrying groceries when her knee gave out and she fell. Pt states she heard a "click" noise and her knee "bended the way it isn't suppose to". No LOC. Pt denies head injury. Pain is exacerbated with ambulating and bearing weight. She has not tried any OTC medications PTA. She denies any additional injuries. Pt further denies fever.   The history is provided by the patient. No language interpreter was used.  Knee Pain   This is a new problem. The current episode started 1 to 2 hours ago. The problem occurs constantly. The problem has not changed since onset.The pain is present in the left knee. The pain is at a severity of 5/10. The pain is mild. The symptoms are aggravated by standing and activity. She has tried nothing for the symptoms. There has been no history of extremity trauma.    Past Medical History:  Diagnosis Date  . Asthma   . Bipolar disorder ([place])   . Chicken pox   . 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 Depression   . Dizziness   . Dysmenorrhea   . Eczema   . Frequent headaches   . GERD (gastroesophageal reflux disease)   . H/O cold sores   . Hypertension    high blood pressure readings   . Migraines    with aura  . Seasonal allergies   . Urine incontinence   . UTI (urinary tract infection)     Patient Active Problem List   Diagnosis Date Noted  . Bipolar  disorder, unspecified 01/28/2013  . Migraines 01/28/2013  . Irregular periods 01/28/2013  . Morbid obesity ([place]) 01/28/2013    Past Surgical History:  Procedure Laterality Date  . TONSILLECTOMY AND ADENOIDECTOMY    . TUBAL LIGATION      OB History    Gravida Para Term Preterm AB Living   7 5 5   2 5    SAB TAB Ectopic Multiple Live Births   2               Home Medications    Prior to Admission medications   Medication Sig Start Date End Date Taking? Authorizing Provider  acetaminophen (TYLENOL) 500 MG tablet Take 1,000 mg by mouth every 6 (six) hours as needed for mild pain, moderate pain or headache.    Historical Provider, MD  Calcium Carbonate-Vit D-Min (CALCIUM 1200 PO) Take 2 tablets by mouth daily.    Historical Provider, MD  ibuprofen (ADVIL,MOTRIN) 800 MG tablet Take 1 tablet (800 mg total) by mouth 3 (three) times daily. 07/09/15   Carmin Muskrat, MD  naproxen (NAPROSYN) 500 MG tablet Take 1 tablet (500 mg total) by mouth 2 (two) times daily. 06/02/16   Etta Quill, NP    Family History Family History  Problem Relation Age of Onset  . Arthritis Mother   . High Cholesterol Mother   .  Hypertension Mother   . Diabetes Mother   . Rheum arthritis Mother   . Asthma Mother   . Hyperlipidemia Mother   . Migraines Mother   . Thyroid disease Mother   . Arthritis Maternal Grandmother   . Breast cancer Maternal Grandmother   . High Cholesterol Maternal Grandmother   . Stroke Maternal Grandmother   . Hypertension Maternal Grandmother   . Migraines Maternal Grandmother     Social History Social History  Substance Use Topics  . Smoking status: Never Smoker  . Smokeless tobacco: Never Used  . Alcohol use No     Allergies   Ceclor [cefaclor] and Pediapred [prednisolone sodium phosphate]   Review of Systems Review of Systems  Constitutional: Negative for chills and fever.  Musculoskeletal: Positive for arthralgias.  All other systems reviewed and are  negative.    Physical Exam Updated Vital Signs BP 144/94 (BP Location: Right Arm)   Pulse 98   Temp 98.4 F (36.9 C) (Oral)   Resp 18   LMP 05/26/2016   SpO2 95%   Physical Exam  Constitutional: She appears well-developed and well-nourished.  HENT:  Head: Normocephalic.  Eyes: Conjunctivae are normal.  Cardiovascular: Normal rate.   Pulmonary/Chest: Effort normal. No respiratory distress.  Abdominal: She exhibits no distension.  Musculoskeletal: She exhibits tenderness.  Upper patellar tenderness.  Neurological: She is alert.  Skin: Skin is warm and dry.  Psychiatric: She has a normal mood and affect. Her behavior is normal.  Nursing note and vitals reviewed.    ED Treatments / Results  DIAGNOSTIC STUDIES: Oxygen Saturation is 95% on RA, adequate by my interpretation.    COORDINATION OF CARE: 7:08 PM Discussed treatment plan with pt at bedside which includes x-ray and pt agreed to plan.  Labs (all labs ordered are listed, but only abnormal results are displayed) Labs Reviewed - No data to display  EKG  EKG Interpretation None       Radiology Dg Knee Complete 4 Views Left  Result Date: 06/02/2016 CLINICAL DATA:  Acute left knee pain following injury today. Initial encounter. EXAM: LEFT KNEE - COMPLETE 4+ VIEW COMPARISON:  None. FINDINGS: No evidence of acute fracture, dislocation, or joint effusion. No evidence of arthropathy or other focal bone abnormality. Soft tissues are unremarkable. IMPRESSION: Negative. Electronically Signed   By: Margarette Canada M.D.   On: 06/02/2016 19:32    Procedures Procedures (including critical care time)  Medications Ordered in ED Medications - No data to display   Initial Impression / Assessment and Plan / ED Course  I have reviewed the triage vital signs and the nursing notes.  Pertinent labs & imaging results that were available during my care of the patient were reviewed by me and considered in my medical decision making  (see chart for details).  Clinical Course    Patient X-Ray negative for obvious fracture or dislocation.  Pt advised to follow up with orthopedics. Patient given knee immobilizer and crutches while in ED, conservative therapy recommended and discussed. Patient will be discharged home & is agreeable with above plan. Returns precautions discussed. Pt appears safe for discharge.   Final Clinical Impressions(s) / ED Diagnoses   Final diagnoses:  Acute pain of left knee    New Prescriptions New Prescriptions   NAPROXEN (NAPROSYN) 500 MG TABLET    Take 1 tablet (500 mg total) by mouth 2 (two) times daily.   I personally performed the services described in this documentation, which was scribed in my  presence. The recorded information has been reviewed and is accurate.     Etta Quill, NP 06/02/16 1958    Charlesetta Shanks, MD 06/03/16 [phone]
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
## 2016-07-04 HISTORY — PX: KNEE ARTHROSCOPY: SUR90

## 2016-07-04 HISTORY — PX: KNEE SURGERY: SHX244

## 2016-12-11 ENCOUNTER — Encounter (HOSPITAL_COMMUNITY): Payer: Self-pay | Admitting: Nurse Practitioner

## 2016-12-11 ENCOUNTER — Emergency Department (HOSPITAL_COMMUNITY)
Admission: EM | Admit: 2016-12-11 | Discharge: 2016-12-11 | Disposition: A | Discharge status: Home / Self Care | Payer: BLUE CROSS/BLUE SHIELD | Attending: Emergency Medicine | Admitting: Emergency Medicine

## 2016-12-11 DIAGNOSIS — R600 Localized edema: Secondary | ICD-10-CM | POA: Diagnosis not present

## 2016-12-11 DIAGNOSIS — R609 Edema, unspecified: Secondary | ICD-10-CM

## 2016-12-11 DIAGNOSIS — R2243 Localized swelling, mass and lump, lower limb, bilateral: Secondary | ICD-10-CM | POA: Diagnosis present

## 2016-12-11 DIAGNOSIS — Z79899 Other long term (current) drug therapy: Secondary | ICD-10-CM | POA: Diagnosis not present

## 2016-12-11 DIAGNOSIS — J45909 Unspecified asthma, uncomplicated: Secondary | ICD-10-CM | POA: Diagnosis not present

## 2016-12-11 DIAGNOSIS — I10 Essential (primary) hypertension: Secondary | ICD-10-CM | POA: Insufficient documentation

## 2016-12-11 LAB — CBC WITH DIFFERENTIAL/PLATELET
Basophils Absolute: 0 10*3/uL (ref 0.0–0.1)
Basophils Relative: 0 %
EOS PCT: 3 %
Eosinophils Absolute: 0.3 10*3/uL (ref 0.0–0.7)
HCT: 38.9 % (ref 36.0–46.0)
Hemoglobin: 13.2 g/dL (ref 12.0–15.0)
LYMPHS ABS: 3.9 10*3/uL (ref 0.7–4.0)
Lymphocytes Relative: 43 %
MCH: 29.5 pg (ref 26.0–34.0)
MCHC: 33.9 g/dL (ref 30.0–36.0)
MCV: 86.8 fL (ref 78.0–100.0)
MONOS PCT: 6 %
Monocytes Absolute: 0.5 10*3/uL (ref 0.1–1.0)
Neutro Abs: 4.3 10*3/uL (ref 1.7–7.7)
Neutrophils Relative %: 48 %
PLATELETS: 210 10*3/uL (ref 150–400)
RBC: 4.48 MIL/uL (ref 3.87–5.11)
RDW: 13 % (ref 11.5–15.5)
WBC: 9 10*3/uL (ref 4.0–10.5)

## 2016-12-11 LAB — D-DIMER, QUANTITATIVE: D-Dimer, Quant: 0.33 ug/mL-FEU (ref 0.00–0.50)

## 2016-12-11 LAB — COMPREHENSIVE METABOLIC PANEL
ALBUMIN: 4.4 g/dL (ref 3.5–5.0)
ALT: 19 U/L (ref 14–54)
AST: 20 U/L (ref 15–41)
Alkaline Phosphatase: 69 U/L (ref 38–126)
Anion gap: 10 (ref 5–15)
BUN: 19 mg/dL (ref 6–20)
CHLORIDE: 107 mmol/L (ref 101–111)
CO2: 24 mmol/L (ref 22–32)
Calcium: 9.1 mg/dL (ref 8.9–10.3)
Creatinine, Ser: 0.65 mg/dL (ref 0.44–1.00)
GFR calc Af Amer: >60 mL/min (ref >60)
Glucose, Bld: 104 mg/dL — ABNORMAL HIGH (ref 65–99)
POTASSIUM: 3.6 mmol/L (ref 3.5–5.1)
SODIUM: 141 mmol/L (ref 135–145)
Total Bilirubin: 0.6 mg/dL (ref 0.3–1.2)
Total Protein: 7.2 g/dL (ref 6.5–8.1)

## 2016-12-11 MED ORDER — HYDROCHLOROTHIAZIDE 12.5 MG PO TABS: ORAL_TABLET | ORAL | 0 refills | Status: DC

## 2016-12-11 MED ORDER — FUROSEMIDE 10 MG/ML IJ SOLN
40.0000 mg | Freq: Once | INTRAMUSCULAR | Status: AC
  Administered 2016-12-11: 40 mg via INTRAMUSCULAR
  Filled 2016-12-11: qty 4

## 2016-12-11 MED ORDER — POTASSIUM CHLORIDE ER 20 MEQ PO TBCR: EXTENDED_RELEASE_TABLET | ORAL | 0 refills | Status: DC

## 2016-12-11 NOTE — ED Provider Notes (Signed)
[facility] DEPT Provider Note   CSN: [id number] Arrival date & time: 12/11/16  0105  Time seen 03:50 AM   History   Chief Complaint Chief Complaint  Patient presents with  . Leg Swelling  . Leg Pain    HPI Debra Miller is a 36 y.o. female.  HPI  patient states she chronically has swelling of her lower extremities however she elevates them the swelling would be gone in the morning. She states however the past 3 days her legs have been continuously swollen. She complains of calf pain for the past 1-1/2 years. She states her legs feel numb from the knees down for the past year and feels like she has a needle sticking her. She denies chest pain, shortness of breath, or fever. She denies any recent traveling or bedrest. She denies any family history of DVT or PE. Patient states she takes care of 5 children including 2 handicapped children.  PCP none  Past Medical History:  Diagnosis Date  . Asthma   . Bipolar disorder ([place])   . Chicken pox   . 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 Depression   . Dizziness   . Dysmenorrhea   . Eczema   . Frequent headaches   . GERD (gastroesophageal reflux disease)   . H/O cold sores   . Hypertension    high blood pressure readings   . Migraines    with aura  . Seasonal allergies   . Urine incontinence   . UTI (urinary tract infection)     Patient Active Problem List   Diagnosis Date Noted  . Bipolar disorder, unspecified ([place]) 01/28/2013  . Migraines 01/28/2013  . Irregular periods 01/28/2013  . Morbid obesity ([place]) 01/28/2013    Past Surgical History:  Procedure Laterality Date  . TONSILLECTOMY AND ADENOIDECTOMY    . TUBAL LIGATION      OB History    Gravida Para Term Preterm AB Living   7 5 5   2 5    SAB TAB Ectopic Multiple Live Births   2               Home Medications    Prior to Admission medications   Medication Sig Start Date End Date Taking? Authorizing  Provider  acetaminophen (TYLENOL) 500 MG tablet Take 1,000 mg by mouth every 6 (six) hours as needed for mild pain, moderate pain or headache.    [provider]  Calcium Carbonate-Vit D-Min (CALCIUM 1200 PO) Take 2 tablets by mouth daily.    [provider]  hydrochlorothiazide (HYDRODIURIL) 12.5 MG tablet Take 1 po QD prn swelling 12/11/16   Rolland Porter, MD  ibuprofen (ADVIL,MOTRIN) 800 MG tablet Take 1 tablet (800 mg total) by mouth 3 (three) times daily. 07/09/15   Carmin Muskrat, MD  naproxen (NAPROSYN) 500 MG tablet Take 1 tablet (500 mg total) by mouth 2 (two) times daily. 06/02/16   Etta Quill, NP  potassium chloride 20 MEQ TBCR Take 1 po with the hydrochlorothiazide. 12/11/16   Rolland Porter, MD    Family History Family History  Problem Relation Age of Onset  . Arthritis Mother   . High Cholesterol Mother   . Hypertension Mother   . Diabetes Mother   . Rheum arthritis Mother   . Asthma Mother   . Hyperlipidemia Mother   . Migraines Mother   . Thyroid disease Mother   . Arthritis Maternal Grandmother   . Breast cancer Maternal  Grandmother   . High Cholesterol Maternal Grandmother   . Stroke Maternal Grandmother   . Hypertension Maternal Grandmother   . Migraines Maternal Grandmother     Social History Social History  Substance Use Topics  . Smoking status: Never Smoker  . Smokeless tobacco: Never Used  . Alcohol use No  unemployed   Allergies   Ceclor [cefaclor] and Pediapred [prednisolone sodium phosphate]   Review of Systems Review of Systems  All other systems reviewed and are negative.    Physical Exam Updated Vital Signs BP 129/70 (BP Location: Right Arm)   Pulse 90   Temp 97.8 F (36.6 C) (Oral)   Resp 16   Ht 5\' 3"  (1.6 m)   Wt 127.9 kg (282 lb)   SpO2 98%   BMI 49.95 kg/m   Vital signs normal    Physical Exam  Constitutional: She appears well-developed and well-nourished. No distress.  HENT:  Head: Normocephalic and  atraumatic.  Right Ear: External ear normal.  Left Ear: External ear normal.  Eyes: Conjunctivae and EOM are normal.  Neck: Normal range of motion.  Cardiovascular: Normal rate.   Pulmonary/Chest: Effort normal. No respiratory distress.  Musculoskeletal: Normal range of motion. She exhibits tenderness.  Patient is generally obese and she does not have pitting edema of her lower extremities. She does appear to have some puffiness of the dorsum of both of her feet again it is nonpitting. She has good distal pulses. She has diffuse tenderness diffusely to even light touch.     ED Treatments / Results  Labs (all labs ordered are listed, but only abnormal results are displayed) Results for orders placed or performed during the hospital encounter of 12/11/16  Comprehensive metabolic panel  Result Value Ref Range   Sodium 141 135 - 145 mmol/L   Potassium 3.6 3.5 - 5.1 mmol/L   Chloride 107 101 - 111 mmol/L   CO2 24 22 - 32 mmol/L   Glucose, Bld 104 (H) 65 - 99 mg/dL   BUN 19 6 - 20 mg/dL   Creatinine, Ser 0.65 0.44 - 1.00 mg/dL   Calcium 9.1 8.9 - 10.3 mg/dL   Total Protein 7.2 6.5 - 8.1 g/dL   Albumin 4.4 3.5 - 5.0 g/dL   AST 20 15 - 41 U/L   ALT 19 14 - 54 U/L   Alkaline Phosphatase 69 38 - 126 U/L   Total Bilirubin 0.6 0.3 - 1.2 mg/dL   GFR calc non Af Amer >60 >60 mL/min   GFR calc Af Amer >60 >60 mL/min   Anion gap 10 5 - 15  CBC with Differential  Result Value Ref Range   WBC 9.0 4.0 - 10.5 K/uL   RBC 4.48 3.87 - 5.11 MIL/uL   Hemoglobin 13.2 12.0 - 15.0 g/dL   HCT 38.9 36.0 - 46.0 %   MCV 86.8 78.0 - 100.0 fL   MCH 29.5 26.0 - 34.0 pg   MCHC 33.9 30.0 - 36.0 g/dL   RDW 13.0 11.5 - 15.5 %   Platelets 210 150 - 400 K/uL   Neutrophils Relative % 48 %   Neutro Abs 4.3 1.7 - 7.7 K/uL   Lymphocytes Relative 43 %   Lymphs Abs 3.9 0.7 - 4.0 K/uL   Monocytes Relative 6 %   Monocytes Absolute 0.5 0.1 - 1.0 K/uL   Eosinophils Relative 3 %   Eosinophils Absolute 0.3 0.0 - 0.7  K/uL   Basophils Relative 0 %   Basophils Absolute  0.0 0.0 - 0.1 K/uL  D-dimer, quantitative  Result Value Ref Range   D-Dimer, Quant 0.33 0.00 - 0.50 ug/mL-FEU   Laboratory interpretation all normal    EKG  EKG Interpretation None       Radiology No results found.  Procedures Procedures (including critical care time)  Medications Ordered in ED Medications  furosemide (LASIX) injection 40 mg (40 mg Intramuscular Given 12/11/16 0414)     Initial Impression / Assessment and Plan / ED Course  I have reviewed the triage vital signs and the nursing notes.  Pertinent labs & imaging results that were available during my care of the patient were reviewed by me and considered in my medical decision making (see chart for details).   patient was given 40 mg of Lasix IM. Laboratory testing was done including d-dimer 2 look for possible DVT.  Recheck at 6:15 AM patient states she's had good urinary output after the IM Lasix. She feels like her legs are less tight and painful. When I examine her legs they do fill well but softer. She was discharged home and we discussed getting a primary care doctor to follow-up with for her complaints.  Final Clinical Impressions(s) / ED Diagnoses   Final diagnoses:  Peripheral edema    New Prescriptions New Prescriptions   HYDROCHLOROTHIAZIDE (HYDRODIURIL) 12.5 MG TABLET    Take 1 po QD prn swelling   POTASSIUM CHLORIDE 20 MEQ TBCR    Take 1 po with the hydrochlorothiazide.    Plan discharge  Rolland Porter, MD, Barbette Or, MD 12/11/16 [phone]

## 2016-12-11 NOTE — ED Triage Notes (Signed)
Pt came in via POV with complaints of bilateral leg pain and edema. Pt states this has occurred before but does not have a PCP to follow. Denies any shortness of breath, discoloration to BLE, history of DVTs/PE, chest pain, or cardiac history. States she elevates them at times and they get better but over the past 3 days the pain is constant and now it hurts to touch legs, or feet and feet feels like they are burning at times.

## 2016-12-11 NOTE — Discharge Instructions (Signed)
Take the hydrochlorothiazide with the potassium pill when you legs get tight or swollen. You need to get a primary care doctor to evaluate your health concerns.

## 2017-03-23 ENCOUNTER — Encounter: Payer: Self-pay | Admitting: Family Medicine

## 2017-07-13 ENCOUNTER — Encounter: Payer: Self-pay | Admitting: Family Medicine

## 2017-11-23 ENCOUNTER — Emergency Department (HOSPITAL_COMMUNITY)
Admission: EM | Admit: 2017-11-23 | Discharge: 2017-11-24 | Disposition: A | Discharge status: Home / Self Care | Payer: BLUE CROSS/BLUE SHIELD | Attending: Emergency Medicine | Admitting: Emergency Medicine

## 2017-11-23 ENCOUNTER — Encounter (HOSPITAL_COMMUNITY): Payer: Self-pay | Admitting: Emergency Medicine

## 2017-11-23 ENCOUNTER — Other Ambulatory Visit: Payer: Self-pay

## 2017-11-23 DIAGNOSIS — R600 Localized edema: Secondary | ICD-10-CM | POA: Insufficient documentation

## 2017-11-23 DIAGNOSIS — E669 Obesity, unspecified: Secondary | ICD-10-CM | POA: Insufficient documentation

## 2017-11-23 DIAGNOSIS — F319 Bipolar disorder, unspecified: Secondary | ICD-10-CM | POA: Diagnosis not present

## 2017-11-23 DIAGNOSIS — I10 Essential (primary) hypertension: Secondary | ICD-10-CM | POA: Diagnosis not present

## 2017-11-23 DIAGNOSIS — Z6841 Body Mass Index (BMI) 40.0 and over, adult: Secondary | ICD-10-CM | POA: Insufficient documentation

## 2017-11-23 DIAGNOSIS — M79605 Pain in left leg: Secondary | ICD-10-CM

## 2017-11-23 DIAGNOSIS — M79662 Pain in left lower leg: Secondary | ICD-10-CM | POA: Insufficient documentation

## 2017-11-23 HISTORY — DX: Mixed hyperlipidemia: E78.2

## 2017-11-23 HISTORY — DX: Pure hypercholesterolemia, unspecified: E78.00

## 2017-11-23 NOTE — ED Triage Notes (Signed)
Pt states about a year ago she noticed a knot on the top of her left leg and over time it has gotten worse but for the past few days her leg has felt like someone is squeezing it and it has a lot of pressure and feels tight and is swollen  Pt states now it hurts to touch it   Pt states she also has pain in both her heels when she walks and describes it as a burning pain

## 2017-11-24 ENCOUNTER — Ambulatory Visit (HOSPITAL_COMMUNITY): Admission: RE | Admit: 2017-11-24 | Payer: BLUE CROSS/BLUE SHIELD | Source: Ambulatory Visit

## 2017-11-24 MED ORDER — FUROSEMIDE 20 MG PO TABS: 20.0000 mg | ORAL_TABLET | Freq: Every day | ORAL | 0 refills | Status: DC

## 2017-11-24 NOTE — Discharge Instructions (Addendum)
Lasix as prescribed.  Elevate your legs whenever possible for the next several days.  Follow-up with your primary doctor if symptoms are not improving in the next 1 to 2 weeks.

## 2017-11-24 NOTE — ED Provider Notes (Signed)
[facility] DEPT Provider Note   CSN: [id number] Arrival date & time: 11/23/17  2320     History   Chief Complaint Chief Complaint  Patient presents with  . Leg Pain    HPI Debra Miller is a 37 y.o. female.  Patient is a 37 year old female with history of obesity, bipolar, asthma, hypertension.  She presents today for evaluation of leg pain.  She reports a swollen, tender area to her left lower leg that has been bothering her for approximately 1 year.  Over the past several weeks it is become more uncomfortable and swollen.  She denies any redness, fevers, or chills.  She denies any prior injury or trauma.  The history is provided by the patient.  Leg Pain   This is a new problem. Episode onset: 1 year ago. The problem occurs constantly. The problem has been gradually worsening. The pain is present in the left lower leg. The pain is moderate. Pertinent negatives include no numbness. She has tried nothing for the symptoms.    Past Medical History:  Diagnosis Date  . Asthma   . Bipolar disorder ([place])   . Chicken pox   . 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 Depression   . Dizziness   . Dysmenorrhea   . Eczema   . Elevated triglycerides with high cholesterol   . Frequent headaches   . GERD (gastroesophageal reflux disease)   . H/O cold sores   . High cholesterol   . Hypertension    high blood pressure readings   . Migraines    with aura  . Seasonal allergies   . Urine incontinence   . UTI (urinary tract infection)     Patient Active Problem List   Diagnosis Date Noted  . Bipolar disorder, unspecified ([place]) 01/28/2013  . Migraines 01/28/2013  . Irregular periods 01/28/2013  . Morbid obesity ([place]) 01/28/2013    Past Surgical History:  Procedure Laterality Date  . KNEE SURGERY    . TONSILLECTOMY AND ADENOIDECTOMY    . TUBAL LIGATION       OB History    Gravida  7   Para  5   Term  5   Preterm      AB  2   Living  5     SAB  2   TAB      Ectopic      Multiple      Live Births               Home Medications    Prior to Admission medications   Medication Sig Start Date End Date Taking? Authorizing Provider  naproxen sodium (ALEVE) 220 MG tablet Take 220 mg by mouth daily as needed (headache).   Yes [provider]  hydrochlorothiazide (HYDRODIURIL) 12.5 MG tablet Take 1 po QD prn swelling Patient not taking: Reported on 11/24/2017 12/11/16   Rolland Porter, MD  potassium chloride 20 MEQ TBCR Take 1 po with the hydrochlorothiazide. Patient not taking: Reported on 11/24/2017 12/11/16   Rolland Porter, MD    Family History Family History  Problem Relation Age of Onset  . Arthritis Mother   . High Cholesterol Mother   . Hypertension Mother   . Diabetes Mother   . Rheum arthritis Mother   . Asthma Mother   . Hyperlipidemia Mother   . Migraines Mother   . Thyroid disease Mother   . Arthritis Maternal Grandmother   .  Breast cancer Maternal Grandmother   . High Cholesterol Maternal Grandmother   . Stroke Maternal Grandmother   . Hypertension Maternal Grandmother   . Migraines Maternal Grandmother     Social History Social History   Tobacco Use  . Smoking status: Never Smoker  . Smokeless tobacco: Never Used  Substance Use Topics  . Alcohol use: No    Alcohol/week: 0.0 oz  . Drug use: No     Allergies   Ceclor [cefaclor] and Pediapred [prednisolone sodium phosphate]   Review of Systems Review of Systems  Neurological: Negative for numbness.  All other systems reviewed and are negative.    Physical Exam Updated Vital Signs BP (!) 177/132 (BP Location: Left Arm) Comment: pt has hx of HTN, not on any medication   Pulse 92   Temp 98.6 F (37 C) (Oral)   Resp 18   Ht 5' 3.5" (1.613 m)   Wt 134.7 kg (297 lb)   LMP 11/09/2017 (Exact Date)   SpO2 100%   BMI 51.79 kg/m   Physical Exam  Constitutional: She is oriented  to person, place, and time. She appears well-developed and well-nourished. No distress.  HENT:  Head: Normocephalic and atraumatic.  Neck: Normal range of motion. Neck supple.  Cardiovascular: Normal rate and regular rhythm. Exam reveals no gallop and no friction rub.  No murmur heard. Pulmonary/Chest: Effort normal and breath sounds normal. No respiratory distress. She has no wheezes.  Abdominal: Soft. Bowel sounds are normal. She exhibits no distension. There is no tenderness.  Musculoskeletal: Normal range of motion. She exhibits edema and tenderness.  Patient has nonpitting edema of both lower extremities.  There is a tender area to the anterior left lower shin.  There is no redness, warmth, or erythema to this area.  DP pulses are easily palpable.  There is no calf tenderness and Homans sign is absent bilaterally.  Neurological: She is alert and oriented to person, place, and time.  Skin: Skin is warm and dry. She is not diaphoretic.  Nursing note and vitals reviewed.    ED Treatments / Results  Labs (all labs ordered are listed, but only abnormal results are displayed) Labs Reviewed - No data to display  EKG None  Radiology No results found.  Procedures Procedures (including critical care time)  Medications Ordered in ED Medications - No data to display   Initial Impression / Assessment and Plan / ED Course  I have reviewed the triage vital signs and the nursing notes.  Pertinent labs & imaging results that were available during my care of the patient were reviewed by me and considered in my medical decision making (see chart for details).  Patient with soft tissue swelling in her left lower leg that I suspect is related to fluid retention and possibly a small lipoma.  Less likely is DVT and I will schedule an ultrasound as an outpatient.  She will be prescribed a short course of diuretics and be advised to keep her legs elevated.  She is to follow-up if her symptoms are  not improving.  Final Clinical Impressions(s) / ED Diagnoses   Final diagnoses:  None    ED Discharge Orders    None       Veryl Speak, MD 11/24/17 [phone]

## 2017-11-28 ENCOUNTER — Ambulatory Visit (HOSPITAL_COMMUNITY): Payer: BLUE CROSS/BLUE SHIELD

## 2017-11-29 ENCOUNTER — Ambulatory Visit (HOSPITAL_COMMUNITY): Admission: RE | Admit: 2017-11-29 | Payer: BLUE CROSS/BLUE SHIELD | Source: Ambulatory Visit

## 2017-11-30 ENCOUNTER — Ambulatory Visit (HOSPITAL_COMMUNITY)
Admission: RE | Admit: 2017-11-30 | Discharge: 2017-11-30 | Disposition: A | Discharge status: Home / Self Care | Payer: BLUE CROSS/BLUE SHIELD | Source: Ambulatory Visit | Attending: Emergency Medicine | Admitting: Emergency Medicine

## 2017-11-30 DIAGNOSIS — M79609 Pain in unspecified limb: Secondary | ICD-10-CM | POA: Diagnosis not present

## 2017-11-30 DIAGNOSIS — M79605 Pain in left leg: Secondary | ICD-10-CM | POA: Diagnosis not present

## 2017-11-30 NOTE — Progress Notes (Signed)
Left lower extremity venous duplex has been completed. Negative for obvious evidence of DVT.  11/30/17 11:20 AM Debra Miller RVT

## 2019-05-03 ENCOUNTER — Other Ambulatory Visit: Payer: Self-pay

## 2019-05-03 ENCOUNTER — Ambulatory Visit (HOSPITAL_COMMUNITY)
Admission: EM | Admit: 2019-05-03 | Discharge: 2019-05-03 | Disposition: A | Discharge status: Home / Self Care | Payer: BC Managed Care – PPO | Attending: Family Medicine | Admitting: Family Medicine

## 2019-05-03 ENCOUNTER — Encounter (HOSPITAL_COMMUNITY): Payer: Self-pay

## 2019-05-03 DIAGNOSIS — K047 Periapical abscess without sinus: Secondary | ICD-10-CM | POA: Diagnosis not present

## 2019-05-03 DIAGNOSIS — K0889 Other specified disorders of teeth and supporting structures: Secondary | ICD-10-CM | POA: Diagnosis not present

## 2019-05-03 MED ORDER — PENICILLIN V POTASSIUM 500 MG PO TABS
500.0000 mg | ORAL_TABLET | Freq: Four times a day (QID) | ORAL | 0 refills | Status: AC
Start: 2019-05-03 — End: 2019-05-13

## 2019-05-03 MED ORDER — IBUPROFEN 800 MG PO TABS: 800.0000 mg | ORAL_TABLET | Freq: Three times a day (TID) | ORAL | 0 refills | Status: DC

## 2019-05-03 NOTE — Discharge Instructions (Addendum)
Take the penicillin antibiotic as prescribed  take ibuprofen 3 times a day with food See your dentist next week Call or return for problems

## 2019-05-03 NOTE — ED Provider Notes (Signed)
[facility]    CSN: [id number] Arrival date & time: 05/03/19  1222      History   Chief Complaint Chief Complaint  Patient presents with  . Dental Pain  . Jaw Pain  . Sinus Problem    HPI Debra Miller is a 38 y.o. female.   HPI  Patient is here for dental pain.  She has a fractured tooth.  Is gotten red and swollen.  She knows that there is an abscess.  She has an appoint with a dentist but it is not to the left.  She is here requesting antibiotics.  Ibuprofen helps with pain.   Past Medical History:  Diagnosis Date  . Asthma   . Bipolar disorder ([place])   . Chicken pox   . 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 Depression   . Dizziness   . Dysmenorrhea   . Eczema   . Elevated triglycerides with high cholesterol   . Frequent headaches   . GERD (gastroesophageal reflux disease)   . H/O cold sores   . High cholesterol   . Hypertension    high blood pressure readings   . Migraines    with aura  . Seasonal allergies   . Urine incontinence   . UTI (urinary tract infection)     Patient Active Problem List   Diagnosis Date Noted  . Bipolar disorder, unspecified ([place]) 01/28/2013  . Migraines 01/28/2013  . Irregular periods 01/28/2013  . Morbid obesity ([place]) 01/28/2013    Past Surgical History:  Procedure Laterality Date  . KNEE SURGERY    . TONSILLECTOMY AND ADENOIDECTOMY    . TUBAL LIGATION      OB History    Gravida  7   Para  5   Term  5   Preterm      AB  2   Living  5     SAB  2   TAB      Ectopic      Multiple      Live Births               Home Medications    Prior to Admission medications   Medication Sig Start Date End Date Taking? Authorizing Provider  Multiple Vitamin (MULTIVITAMIN) tablet Take 1 tablet by mouth daily.   Yes [provider]  furosemide (LASIX) 20 MG tablet Take 1 tablet (20 mg total) by mouth daily. 11/24/17   Veryl Speak, MD   ibuprofen (ADVIL) 800 MG tablet Take 1 tablet (800 mg total) by mouth 3 (three) times daily. 05/03/19   Raylene Everts, MD  naproxen sodium (ALEVE) 220 MG tablet Take 220 mg by mouth daily as needed (headache).    [provider]  penicillin v potassium (VEETID) 500 MG tablet Take 1 tablet (500 mg total) by mouth 4 (four) times daily for 10 days. 05/03/19 05/13/19  Raylene Everts, MD  hydrochlorothiazide (HYDRODIURIL) 12.5 MG tablet Take 1 po QD prn swelling Patient not taking: Reported on 11/24/2017 12/11/16 05/03/19  Rolland Porter, MD  potassium chloride 20 MEQ TBCR Take 1 po with the hydrochlorothiazide. Patient not taking: Reported on 11/24/2017 12/11/16 05/03/19  Rolland Porter, MD    Family History Family History  Problem Relation Age of Onset  . Arthritis Mother   . High Cholesterol Mother   . Hypertension Mother   . Diabetes Mother   . Rheum arthritis Mother   . Asthma  Mother   . Hyperlipidemia Mother   . Migraines Mother   . Thyroid disease Mother   . Arthritis Maternal Grandmother   . Breast cancer Maternal Grandmother   . High Cholesterol Maternal Grandmother   . Stroke Maternal Grandmother   . Hypertension Maternal Grandmother   . Migraines Maternal Grandmother     Social History Social History   Tobacco Use  . Smoking status: Never Smoker  . Smokeless tobacco: Never Used  Substance Use Topics  . Alcohol use: No    Alcohol/week: 0.0 standard drinks  . Drug use: No     Allergies   Ceclor [cefaclor] and Pediapred [prednisolone sodium phosphate]   Review of Systems Review of Systems  Constitutional: Negative for chills and fever.  HENT: Positive for dental problem. Negative for ear pain and sore throat.   Eyes: Negative for pain and visual disturbance.  Respiratory: Negative for cough and shortness of breath.   Cardiovascular: Negative for chest pain and palpitations.  Gastrointestinal: Negative for abdominal pain and vomiting.  Genitourinary:  Negative for dysuria and hematuria.  Musculoskeletal: Negative for arthralgias and back pain.  Skin: Negative for color change and rash.  Neurological: Negative for seizures and syncope.  All other systems reviewed and are negative.    Physical Exam Triage Vital Signs ED Triage Vitals  Enc Vitals Group     BP 05/03/19 1317 (!) 155/105     Pulse Rate 05/03/19 1317 94     Resp 05/03/19 1317 18     Temp 05/03/19 1317 98.2 F (36.8 C)     Temp Source 05/03/19 1317 Oral     SpO2 05/03/19 1317 97 %     Weight --      Height --      Head Circumference --      Peak Flow --      Pain Score 05/03/19 1315 6     Pain Loc --      Pain Edu? --      Excl. in [place]? --    No data found.  Updated Vital Signs BP (!) 155/105 (BP Location: Left Arm)   Pulse 94   Temp 98.2 F (36.8 C) (Oral)   Resp 18   LMP  (Within Weeks)   SpO2 97%     Physical Exam Constitutional:      General: She is not in acute distress.    Appearance: She is well-developed.  HENT:     Head: Normocephalic and atraumatic.     Mouth/Throat:     Comments: Right upper cavity and is fractured at the gums, abscess swelling is seen Eyes:     Conjunctiva/sclera: Conjunctivae normal.     Pupils: Pupils are equal, round, and reactive to light.  Neck:     Musculoskeletal: Normal range of motion.  Cardiovascular:     Rate and Rhythm: Normal rate.  Pulmonary:     Effort: Pulmonary effort is normal. No respiratory distress.  Abdominal:     General: There is no distension.     Palpations: Abdomen is soft.  Musculoskeletal: Normal range of motion.  Skin:    General: Skin is warm and dry.  Neurological:     Mental Status: She is alert.  Psychiatric:        Mood and Affect: Mood normal.        Behavior: Behavior normal.      UC Treatments / Results  Labs (all labs ordered are listed, but only abnormal results are  displayed) Labs Reviewed - No data to display  EKG   Radiology No results found.  Procedures  Procedures (including critical care time)  Medications Ordered in UC Medications - No data to display  Initial Impression / Assessment and Plan / UC Course  I have reviewed the triage vital signs and the nursing notes.  Pertinent labs & imaging results that were available during my care of the patient were reviewed by me and considered in my medical decision making (see chart for details).      Final Clinical Impressions(s) / UC Diagnoses   Final diagnoses:  Dental abscess  Tooth pain     Discharge Instructions     Take the penicillin antibiotic as prescribed  take ibuprofen 3 times a day with food See your dentist next week Call or return for problems    ED Prescriptions    Medication Sig Dispense Auth. Provider   penicillin v potassium (VEETID) 500 MG tablet Take 1 tablet (500 mg total) by mouth 4 (four) times daily for 10 days. 40 tablet Raylene Everts, MD   ibuprofen (ADVIL) 800 MG tablet Take 1 tablet (800 mg total) by mouth 3 (three) times daily. 21 tablet Raylene Everts, MD     PDMP not reviewed this encounter.   Raylene Everts, MD 05/03/19 [phone]

## 2019-05-03 NOTE — ED Triage Notes (Signed)
Pt states 1 of her teeth broke 2 weeks ago, since that moment pt is having right sided face pain, right side jaw pain and right side sinus pressure and swelling. Pt states ibuprofen helps with the pain.

## 2019-06-21 ENCOUNTER — Ambulatory Visit: Payer: BC Managed Care – PPO | Admitting: Family Medicine

## 2019-06-21 ENCOUNTER — Encounter: Payer: BC Managed Care – PPO | Admitting: Family Medicine

## 2019-07-22 ENCOUNTER — Other Ambulatory Visit: Payer: Self-pay

## 2019-07-23 ENCOUNTER — Encounter: Payer: Self-pay | Admitting: Family Medicine

## 2019-07-23 ENCOUNTER — Ambulatory Visit (INDEPENDENT_AMBULATORY_CARE_PROVIDER_SITE_OTHER): Payer: BC Managed Care – PPO | Admitting: Family Medicine

## 2019-07-23 VITALS — BP 149/86 | HR 88 | Temp 98.2°F | Ht 63.5 in | Wt 282.2 lb

## 2019-07-23 DIAGNOSIS — F319 Bipolar disorder, unspecified: Secondary | ICD-10-CM | POA: Diagnosis not present

## 2019-07-23 DIAGNOSIS — J45909 Unspecified asthma, uncomplicated: Secondary | ICD-10-CM | POA: Diagnosis not present

## 2019-07-23 DIAGNOSIS — Z13220 Encounter for screening for lipoid disorders: Secondary | ICD-10-CM | POA: Diagnosis not present

## 2019-07-23 DIAGNOSIS — G43809 Other migraine, not intractable, without status migrainosus: Secondary | ICD-10-CM

## 2019-07-23 DIAGNOSIS — R202 Paresthesia of skin: Secondary | ICD-10-CM | POA: Diagnosis not present

## 2019-07-23 DIAGNOSIS — R079 Chest pain, unspecified: Secondary | ICD-10-CM

## 2019-07-23 DIAGNOSIS — L709 Acne, unspecified: Secondary | ICD-10-CM

## 2019-07-23 DIAGNOSIS — H6090 Unspecified otitis externa, unspecified ear: Secondary | ICD-10-CM

## 2019-07-23 MED ORDER — ALBUTEROL SULFATE HFA 108 (90 BASE) MCG/ACT IN AERS: 2.0000 | INHALATION_SPRAY | Freq: Four times a day (QID) | RESPIRATORY_TRACT | 1 refills | Status: DC | PRN

## 2019-07-23 MED ORDER — ACETIC ACID 2 % OT SOLN: 4.0000 [drp] | OTIC | 0 refills | Status: DC

## 2019-07-23 NOTE — Patient Instructions (Signed)
It was very nice to see you today!  We will check blood work today.  Please start the drop for your ear.   We will contact you once blood work comes back later this week.  Take care, Dr [name]  Please try these tips to maintain a healthy lifestyle:   Eat at least 3 REAL meals and 1-2 snacks per day.  Aim for no more than 5 hours between eating.  If you eat breakfast, please do so within one hour of getting up.    Each meal should contain half fruits/vegetables, one quarter protein, and one quarter carbs (no bigger than a computer mouse)   Cut down on sweet beverages. This includes juice, soda, and sweet tea.     Drink at least 1 glass of water with each meal and aim for at least 8 glasses per day   Exercise at least 150 minutes every week.

## 2019-07-23 NOTE — Assessment & Plan Note (Signed)
Currently managed without medications.  Will check labs as noted below.

## 2019-07-23 NOTE — Assessment & Plan Note (Signed)
Stable off meds. ?

## 2019-07-23 NOTE — Assessment & Plan Note (Signed)
Refilled albuterol today. 

## 2019-07-23 NOTE — Progress Notes (Signed)
Debra Miller is a 39 y.o. female who presents today for an office visit.  She is a new patient.  Assessment/Plan:  New/Acute Problems: Otitis externa Start acetic acid drops.  No red flags.  Skin Changes / Hair Loss Brought differential.  Will check for hypotestosteronsim.  We will also check TSH.  Given acanthosis, will check A1c to rule out diabetes.  Fatigue Likely related to poor sleep however could be part of overall larger picture.  Will check labs including CBC, C met, TSH, B12, vitamin D, ANA, CRP, and sedimentation rate.  Depending on results may need referral for sleep study.  Back Pain / Extremity Numbness Check labs noted above.  No red flags today.  Would consider referral to neurology for further evaluation of paresthesias if above work-up is negative.  Shortness of Breath Likely related to underlying asthma.  May have component of OHS as well.  EKG today with no ischemic changes.  Will check labs as noted above.  Consider chest imaging and/or referral to pulmonology depending on results of above.   Chronic Problems Addressed Today: Migraines Currently managed without medications.  Will check labs as noted below.  Bipolar disorder, unspecified Stable off meds.  Asthma Refilled albuterol today.     Subjective:  HPI:  Patient has several issues today outlined below  Left ear pain Started few days ago.  Feels similar to past ear infections.  No obvious injuries or precipitating events.  Pain with palpation and pain to touch.  Skin changes/acne/hair loss Started several years ago.  Has been seen by dermatology and started on a cream for acne in the past which did not help significantly.  Overall symptoms are stable.  She is also noticed discoloration in the folds of her neck.  Has had increasing issues with hair loss as well.  These have been going on for at least a couple of years.  Fatigue Also present for several years.  She feels tired all the time.  She  has tried taking B12 with no improvement.  Has sporadic sleep.  Does not feel refreshed upon waking up.  Mother has been diagnosed with sleep apnea.  Low back pain/arm numbness/leg numbness Also started a few years ago.  Had an episode where she fractured her tailbone and has had intermittent episodes of low back pain since then.  States that her legs and feet feel numb all the time.  Also has a tender spot on her left shin.  Shortness of Breath Patient also has intermittent episodes of chest tightness and shortness of breath.  Feels like her breath is getting restricted.  This is been going on for a few years.  Worse at night.  Worse when laying down.       Objective:  Physical Exam: BP (!) 149/86   Pulse 88   Temp 98.2 F (36.8 C)   Ht 5' 3.5" (1.613 m)   Wt 282 lb 4 oz (128 kg)   LMP 07/19/2019   SpO2 99%   BMI 49.21 kg/m   Gen: No acute distress, resting comfortably HEENT: Right TM clear.  Left EAC with purulent debris. CV: Regular rate and rhythm with no murmurs appreciated Pulm: Normal work of breathing, clear to auscultation bilaterally with no crackles, wheezes, or rhonchi Neuro: Grossly normal, moves all extremities Skin: pustular acne noted on face. Hyperpigmentation around lateral neck MSK: Back without deformities.  Neurovascular intact in lower extremities.  Reflexes 2+ and symmetric throughout. Psych: Normal affect and thought  content  EKG: NSR, no ischemic changes.   Time Spent: 70 minutes of total time was spent on the date of the encounter performing the following actions: chart review prior to seeing the patient, obtaining history, performing a medically necessary exam, counseling on the treatment plan, placing orders, and documenting in our EHR.       Algis Greenhouse. [name], MD 07/23/2019 4:31 PM

## 2019-07-24 LAB — COMPREHENSIVE METABOLIC PANEL
ALT: 23 U/L (ref 0–35)
AST: 15 U/L (ref 0–37)
Albumin: 4.4 g/dL (ref 3.5–5.2)
Alkaline Phosphatase: 87 U/L (ref 39–117)
BUN: 15 mg/dL (ref 6–23)
CO2: 25 mEq/L (ref 19–32)
Calcium: 9.1 mg/dL (ref 8.4–10.5)
Chloride: 106 mEq/L (ref 96–112)
Creatinine, Ser: 0.69 mg/dL (ref 0.40–1.20)
GFR: 95.65 mL/min (ref >60.00)
Glucose, Bld: 94 mg/dL (ref 70–99)
Potassium: 4.4 mEq/L (ref 3.5–5.1)
Sodium: 140 mEq/L (ref 135–145)
Total Bilirubin: 0.5 mg/dL (ref 0.2–1.2)
Total Protein: 7.1 g/dL (ref 6.0–8.3)

## 2019-07-24 LAB — SEDIMENTATION RATE: Sed Rate: 20 mm/hr (ref 0–20)

## 2019-07-24 LAB — LIPID PANEL
Cholesterol: 209 mg/dL — ABNORMAL HIGH (ref 0–200)
HDL: 39.1 mg/dL (ref >39.00)
NonHDL: 170
Total CHOL/HDL Ratio: 5
Triglycerides: 319 mg/dL — ABNORMAL HIGH (ref 0.0–149.0)
VLDL: 63.8 mg/dL — ABNORMAL HIGH (ref 0.0–40.0)

## 2019-07-24 LAB — CBC
HCT: 41.8 % (ref 36.0–46.0)
Hemoglobin: 14.1 g/dL (ref 12.0–15.0)
MCHC: 33.7 g/dL (ref 30.0–36.0)
MCV: 87.9 fl (ref 78.0–100.0)
Platelets: 236 10*3/uL (ref 150.0–400.0)
RBC: 4.76 Mil/uL (ref 3.87–5.11)
RDW: 13.7 % (ref 11.5–15.5)
WBC: 7.6 10*3/uL (ref 4.0–10.5)

## 2019-07-24 LAB — TSH: TSH: 2.14 u[IU]/mL (ref 0.35–4.50)

## 2019-07-24 LAB — HEMOGLOBIN A1C: Hgb A1c MFr Bld: 5.3 % (ref 4.6–6.5)

## 2019-07-24 LAB — LDL CHOLESTEROL, DIRECT: Direct LDL: 126 mg/dL

## 2019-07-24 LAB — TESTOSTERONE: Testosterone: 47.88 ng/dL — ABNORMAL HIGH (ref 15.00–40.00)

## 2019-07-24 LAB — VITAMIN B12: Vitamin B-12: 990 pg/mL — ABNORMAL HIGH (ref 211–911)

## 2019-07-24 LAB — VITAMIN D 25 HYDROXY (VIT D DEFICIENCY, FRACTURES): VITD: 20.17 ng/mL — ABNORMAL LOW (ref 30.00–100.00)

## 2019-07-24 LAB — C-REACTIVE PROTEIN: CRP: 1 mg/dL (ref 0.5–20.0)

## 2019-07-24 LAB — ANA: Anti Nuclear Antibody (ANA): NEGATIVE

## 2019-07-25 ENCOUNTER — Other Ambulatory Visit: Payer: Self-pay

## 2019-07-25 MED ORDER — CHOLECALCIFEROL 1.25 MG (50000 UT) PO TABS: ORAL_TABLET | ORAL | 0 refills | Status: DC

## 2019-07-25 NOTE — Progress Notes (Signed)
Please inform patient of the following:  Her testosterone level is elevated. She likely has PCOS and I think this could explain some of her symptoms. Would like for her to schedule a visit to discuss treatment options.  Vitamin D was also low . Recommend starting 50000IU weekly.  Cholesterol was mildly elevated, but I think this should improve as we treat her PCOS - do not need to do anything else at this point.  All of her other labs are NORMAL.  Algis Greenhouse. [name], MD 07/25/2019 1:13 PM

## 2019-07-31 ENCOUNTER — Telehealth: Payer: Self-pay | Admitting: Family Medicine

## 2019-07-31 ENCOUNTER — Other Ambulatory Visit: Payer: Self-pay

## 2019-07-31 ENCOUNTER — Emergency Department (HOSPITAL_COMMUNITY)
Admission: EM | Admit: 2019-07-31 | Discharge: 2019-07-31 | Disposition: A | Discharge status: Home / Self Care | Payer: BC Managed Care – PPO | Attending: Emergency Medicine | Admitting: Emergency Medicine

## 2019-07-31 ENCOUNTER — Encounter (HOSPITAL_COMMUNITY): Payer: Self-pay

## 2019-07-31 DIAGNOSIS — I10 Essential (primary) hypertension: Secondary | ICD-10-CM | POA: Diagnosis not present

## 2019-07-31 DIAGNOSIS — H60392 Other infective otitis externa, left ear: Secondary | ICD-10-CM

## 2019-07-31 DIAGNOSIS — Z79899 Other long term (current) drug therapy: Secondary | ICD-10-CM | POA: Diagnosis not present

## 2019-07-31 DIAGNOSIS — J45909 Unspecified asthma, uncomplicated: Secondary | ICD-10-CM | POA: Diagnosis not present

## 2019-07-31 DIAGNOSIS — B029 Zoster without complications: Secondary | ICD-10-CM

## 2019-07-31 DIAGNOSIS — H9202 Otalgia, left ear: Secondary | ICD-10-CM | POA: Diagnosis present

## 2019-07-31 MED ORDER — CIPROFLOXACIN-DEXAMETHASONE 0.3-0.1 % OT SUSP
4.0000 [drp] | Freq: Once | OTIC | Status: AC
  Administered 2019-07-31: 4 [drp] via OTIC
  Filled 2019-07-31: qty 7.5

## 2019-07-31 MED ORDER — AMOXICILLIN-POT CLAVULANATE 875-125 MG PO TABS
1.0000 | ORAL_TABLET | Freq: Once | ORAL | Status: AC
  Administered 2019-07-31: 1 via ORAL
  Filled 2019-07-31: qty 1

## 2019-07-31 MED ORDER — VALACYCLOVIR HCL 500 MG PO TABS
1000.0000 mg | ORAL_TABLET | Freq: Once | ORAL | Status: AC
  Administered 2019-07-31: 1000 mg via ORAL
  Filled 2019-07-31: qty 2

## 2019-07-31 MED ORDER — HYDROCODONE-ACETAMINOPHEN 5-325 MG PO TABS: 1.0000 | ORAL_TABLET | ORAL | 0 refills | Status: DC | PRN

## 2019-07-31 MED ORDER — AMOXICILLIN-POT CLAVULANATE 875-125 MG PO TABS: 1.0000 | ORAL_TABLET | Freq: Two times a day (BID) | ORAL | 0 refills | Status: DC

## 2019-07-31 MED ORDER — VALACYCLOVIR HCL 1 G PO TABS: 1000.0000 mg | ORAL_TABLET | Freq: Three times a day (TID) | ORAL | 0 refills | Status: DC

## 2019-07-31 NOTE — ED Triage Notes (Signed)
Patient states she was diagnosed with a left ear infection 2 weeks ago . Patient states she was prescribed ear gtts. Patient has swelling to the left ear and increased pain. Patient states she has been having left  Yellow ear drainage.  Patient denies any problem swallowing or breathing.

## 2019-07-31 NOTE — ED Provider Notes (Signed)
[facility] DEPT Provider Note   CSN: [id number] Arrival date & time: 07/31/19  1812     History Chief Complaint  Patient presents with  . Otalgia  . Facial Swelling    Debra Miller is a 39 y.o. female.  39 year old female presents with complaint of left ear pain and swelling.  Patient went to her PCP 1 week ago for a physical with complaint of left ear pain, found to have drainage in the ear at that time and was started on acetic acid drops.  Patient has been using the drops without improvement, has worsening swelling of her ear with pain and discharge from the ear.  Patient also reports a blister to her left upper lip with altered sensation on the left cheek area.  Patient reports blister first.  3 days ago.  No other complaints or concerns.  Patient is not a diabetic.        Past Medical History:  Diagnosis Date  . Asthma   . Bipolar disorder ([place])   . Chicken pox   . 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 Depression   . Dizziness   . Dysmenorrhea   . Eczema   . Elevated triglycerides with high cholesterol   . Frequent headaches   . GERD (gastroesophageal reflux disease)   . H/O cold sores   . High cholesterol   . Hypertension    high blood pressure readings   . Migraines    with aura  . Seasonal allergies   . Urine incontinence   . UTI (urinary tract infection)     Patient Active Problem List   Diagnosis Date Noted  . Asthma 07/23/2019  . Bipolar disorder, unspecified ([place]) 01/28/2013  . Migraines 01/28/2013  . Irregular periods 01/28/2013  . Morbid obesity ([place]) 01/28/2013    Past Surgical History:  Procedure Laterality Date  . KNEE SURGERY    . TONSILLECTOMY AND ADENOIDECTOMY    . TUBAL LIGATION       OB History    Gravida  7   Para  5   Term  5   Preterm      AB  2   Living  5     SAB  2   TAB      Ectopic      Multiple      Live Births              Family History  Problem Relation Age of Onset  . Arthritis Mother   . High Cholesterol Mother   . Hypertension Mother   . Diabetes Mother   . Rheum arthritis Mother   . Asthma Mother   . Hyperlipidemia Mother   . Migraines Mother   . Thyroid disease Mother   . Arthritis Maternal Grandmother   . Breast cancer Maternal Grandmother   . High Cholesterol Maternal Grandmother   . Stroke Maternal Grandmother   . Hypertension Maternal Grandmother   . Migraines Maternal Grandmother     Social History   Tobacco Use  . Smoking status: Never Smoker  . Smokeless tobacco: Never Used  Substance Use Topics  . Alcohol use: No    Alcohol/week: 0.0 standard drinks  . Drug use: No    Home Medications Prior to Admission medications   Medication Sig Start Date End Date Taking? Authorizing Provider  acetic acid 2 % otic solution Place 4 drops into the left ear every 3 (three)  hours. 07/23/19   Vivi Barrack, MD  albuterol (VENTOLIN HFA) 108 (90 Base) MCG/ACT inhaler Inhale 2 puffs into the lungs every 6 (six) hours as needed for wheezing or shortness of breath. 07/23/19   Vivi Barrack, MD  amoxicillin-clavulanate (AUGMENTIN) 875-125 MG tablet Take 1 tablet by mouth every 12 (twelve) hours. 07/31/19   Tacy Learn, PA-C  Cholecalciferol 1.25 MG (50000 UT) TABS 50,000 units PO qwk for 12 weeks. 07/25/19   Vivi Barrack, MD  HYDROcodone-acetaminophen (NORCO/VICODIN) 5-325 MG tablet Take 1 tablet by mouth every 4 (four) hours as needed for up to 4 doses. 07/31/19   Tacy Learn, PA-C  Multiple Vitamin (MULTIVITAMIN) tablet Take 1 tablet by mouth daily.    [provider]  valACYclovir (VALTREX) 1000 MG tablet Take 1 tablet (1,000 mg total) by mouth 3 (three) times daily. 07/31/19   Tacy Learn, PA-C  hydrochlorothiazide (HYDRODIURIL) 12.5 MG tablet Take 1 po QD prn swelling Patient not taking: Reported on 11/24/2017 12/11/16 05/03/19  Rolland Porter, MD  potassium chloride 20  MEQ TBCR Take 1 po with the hydrochlorothiazide. Patient not taking: Reported on 11/24/2017 12/11/16 05/03/19  Rolland Porter, MD    Allergies    Ceclor [cefaclor] and Pediapred [prednisolone sodium phosphate]  Review of Systems   Review of Systems  Constitutional: Negative for fever.  HENT: Positive for ear discharge, ear pain and facial swelling. Negative for congestion and sore throat.   Eyes: Negative for pain, redness and visual disturbance.  Respiratory: Negative for cough and shortness of breath.   Musculoskeletal: Negative for neck pain and neck stiffness.  Skin: Positive for rash. Negative for wound.  Allergic/Immunologic: Negative for immunocompromised state.  Hematological: Positive for adenopathy.  Psychiatric/Behavioral: Negative for confusion.  All other systems reviewed and are negative.   Physical Exam Updated Vital Signs BP (!) 155/94 (BP Location: Right Arm)   Pulse 96   Temp 98.7 F (37.1 C) (Oral)   Resp 19   Ht 5' 3.5" (1.613 m)   Wt 127.9 kg   LMP 07/19/2019   SpO2 97%   BMI 49.17 kg/m   Physical Exam Vitals and nursing note reviewed.  Constitutional:      General: She is not in acute distress.    Appearance: She is well-developed. She is not diaphoretic.  HENT:     Head: Normocephalic and atraumatic.      Right Ear: Tympanic membrane, ear canal and external ear normal. There is no impacted cerumen.     Left Ear: Drainage and swelling present. No tenderness. No mastoid tenderness.     Nose: Nose normal.     Mouth/Throat:     Mouth: Mucous membranes are moist.  Pulmonary:     Effort: Pulmonary effort is normal.  Musculoskeletal:     Cervical back: Neck supple.  Lymphadenopathy:     Cervical: Cervical adenopathy present.  Skin:    General: Skin is warm and dry.     Findings: Erythema and rash present.  Neurological:     Mental Status: She is alert and oriented to person, place, and time.  Psychiatric:        Behavior: Behavior normal.      ED Results / Procedures / Treatments   Labs (all labs ordered are listed, but only abnormal results are displayed) Labs Reviewed - No data to display  EKG None  Radiology No results found.  Procedures Procedures (including critical care time)  Medications Ordered in ED Medications  ciprofloxacin-dexamethasone (CIPRODEX) 0.3-0.1 % OTIC (EAR) suspension 4 drop (4 drops Left EAR Given 07/31/19 1958)  amoxicillin-clavulanate (AUGMENTIN) 875-125 MG per tablet 1 tablet (1 tablet Oral Given 07/31/19 1957)  valACYclovir (VALTREX) tablet 1,000 mg (1,000 mg Oral Given 07/31/19 1957)    ED Course  I have reviewed the triage vital signs and the nursing notes.  Pertinent labs & imaging results that were available during my care of the patient were reviewed by me and considered in my medical decision making (see chart for details).  Clinical Course as of Jul 31 2023  Wed Jul 30, 7417  [id number] 39 year old female presents with complaint of left ear pain onset 1 week ago, treated by PCP with acetic acid drops without improvement.  Now with worsening pain and swelling of the ear.  On exam found to have swelling with erythema of the left external ear, left ear canal swollen nearly shut with yellow serous drainage from the ear.  Question pustule versus vesicle anterior to left ear, also found to have vesicle to left upper lip.  Patient was onset of this, lip 3 days ago, will consider Valtrex as possible cause of patient's symptoms today as well as possible worsening bacterial infection of the ear.  Does not appear to have malignant otitis at this time.  Case discussed with Dr. Sherry Ruffing, ED attending, will treat with Augmentin, Ciprodex with placement of an ear wick, Valtrex, follow-up with ENT, referral given.  Patient vies return to ER for worsening or concerning symptoms otherwise she should follow-up with ENT, remove ear wick after 24 hours, she has a second year which she may use if she still has  significant swelling of the canal.   [LM]    Clinical Course User Index [LM] Roque Lias   MDM Rules/Calculators/A&P                      Final Clinical Impression(s) / ED Diagnoses Final diagnoses:  Other infective acute otitis externa of left ear  Herpes zoster without complication    Rx / DC Orders ED Discharge Orders         Ordered    valACYclovir (VALTREX) 1000 MG tablet  3 times daily     07/31/19 1955    amoxicillin-clavulanate (AUGMENTIN) 875-125 MG tablet  Every 12 hours     07/31/19 1955    HYDROcodone-acetaminophen (NORCO/VICODIN) 5-325 MG tablet  Every 4 hours PRN     07/31/19 2013           Tacy Learn, PA-C 07/31/19 2025    Tegeler, Gwenyth Allegra, MD 08/01/19 [id number]

## 2019-07-31 NOTE — Telephone Encounter (Signed)
Please advise 

## 2019-07-31 NOTE — Discharge Instructions (Addendum)
I was unable to cancel or transfer your prescriptions tonight.  [facility] if you would like to pick them up there.  Warm compresses to face. Take Augmentin and Valtrex as prescribed and complete the full course. Apply Ciprodex to ear- four drops 2 times daily. Ear wick should fall out after 24 hours, if it does not fall out, you should remove it. Follow up with ENT, referral given, call in the morning for an appointment on Friday.

## 2019-07-31 NOTE — Telephone Encounter (Signed)
Patient's husband called in saying that the ear infection is still not getting any better and wanted to know if Dr.Parker could send in an antibiotic to help with this issue.    [id number] - [place], [place] [address]

## 2019-08-01 NOTE — Telephone Encounter (Signed)
Looks like pt went to urgent care - Please call and a check on pt to see if there is anything else that we can do.  Algis Greenhouse. [name], MD 08/01/2019 8:00 AM

## 2019-08-01 NOTE — Telephone Encounter (Signed)
Patient stated she received Rx for ears,a referral to ENT.She will follow up as needed. FYI

## 2019-08-02 ENCOUNTER — Ambulatory Visit: Payer: BC Managed Care – PPO | Admitting: Family Medicine

## 2019-08-02 ENCOUNTER — Other Ambulatory Visit: Payer: Self-pay

## 2019-08-02 ENCOUNTER — Encounter: Payer: Self-pay | Admitting: Family Medicine

## 2019-08-02 DIAGNOSIS — R7989 Other specified abnormal findings of blood chemistry: Secondary | ICD-10-CM | POA: Diagnosis not present

## 2019-08-02 DIAGNOSIS — M549 Dorsalgia, unspecified: Secondary | ICD-10-CM | POA: Diagnosis not present

## 2019-08-02 DIAGNOSIS — E282 Polycystic ovarian syndrome: Secondary | ICD-10-CM | POA: Diagnosis not present

## 2019-08-02 DIAGNOSIS — I10 Essential (primary) hypertension: Secondary | ICD-10-CM

## 2019-08-02 DIAGNOSIS — M5416 Radiculopathy, lumbar region: Secondary | ICD-10-CM | POA: Insufficient documentation

## 2019-08-02 MED ORDER — SPIRONOLACTONE 50 MG PO TABS: 50.0000 mg | ORAL_TABLET | Freq: Every day | ORAL | 5 refills | Status: DC

## 2019-08-02 MED ORDER — METFORMIN HCL ER 750 MG PO TB24: 750.0000 mg | ORAL_TABLET | Freq: Every day | ORAL | 5 refills | Status: DC

## 2019-08-02 NOTE — Assessment & Plan Note (Signed)
On 50,000 international units weekly.  We will check vitamin D level in 3 to 6 months.

## 2019-08-02 NOTE — Assessment & Plan Note (Signed)
Start spironolactone 50 mg daily given concurrent PCOS.

## 2019-08-02 NOTE — Assessment & Plan Note (Signed)
Having some extremity numbness as well however symptoms are stable for the past several years.  Hopefully will have improvement with treatment of her PCOS and vitamin D deficiency.  She will follow-up in 4 to 6 weeks.  Would consider imaging and special referral to sports medicine if continues to have low back pain despite above.

## 2019-08-02 NOTE — Patient Instructions (Signed)
It was very nice to see you today!  You have a low vitamin D and elevated testosterone levels.  You likely have PCOS.  We will start 2 medications to help regulate your hormones.  Please take the spironolactone and Metformin every day.  Let me know if you have any side effects.  Please check in with me in 4 to 6 weeks.  Take care, Dr [name]  Please try these tips to maintain a healthy lifestyle:   Eat at least 3 REAL meals and 1-2 snacks per day.  Aim for no more than 5 hours between eating.  If you eat breakfast, please do so within one hour of getting up.    Each meal should contain half fruits/vegetables, one quarter protein, and one quarter carbs (no bigger than a computer mouse)   Cut down on sweet beverages. This includes juice, soda, and sweet tea.     Drink at least 1 glass of water with each meal and aim for at least 8 glasses per day   Exercise at least 150 minutes every week.

## 2019-08-02 NOTE — Progress Notes (Signed)
   Debra Miller is a 39 y.o. female who presents today for an office visit.  Assessment/Plan:  Chronic Problems Addressed Today: Low vitamin D level On 50,000 international units weekly.  We will check vitamin D level in 3 to 6 months.  PCOS (polycystic ovarian syndrome) Potentially could explain several of her symptoms.  She has not a candidate for hormonal contraceptives given her history of migraines with aura.  Given her current current elevated blood pressure readings and desire to lose weight, will start both spironolactone 50 mg daily and Metformin 750 mg daily.  She will follow-up in 4 to 6 weeks.  Back pain Having some extremity numbness as well however symptoms are stable for the past several years.  Hopefully will have improvement with treatment of her PCOS and vitamin D deficiency.  She will follow-up in 4 to 6 weeks.  Would consider imaging and special referral to sports medicine if continues to have low back pain despite above.  Morbid obesity ([place]) Given likely PCOS will start Metformin.  Will increase as tolerated.  Would likely benefit from addition of GLP-1 agonist in the future depending on response to Metformin.  Essential hypertension Start spironolactone 50 mg daily given concurrent PCOS.     Subjective:  HPI:  Patient is here for lab follow-up.  She was seen a 10 days ago for multitude of symptoms including fatigue, skin changes, acne, hair loss, back pain, arm numbness, and leg numbness.  Lab results showed low vitamin D and elevated testosterone level.  She was also diagnosed with shingles in her left ear since her last visit.  She is followed up with ENT.  She is currently on Valtrex which seems to be improving her symptoms.       Objective:  Physical Exam: BP (!) 160/90   Pulse 100   Temp 98.2 F (36.8 C)   Ht 5' 3.5" (1.613 m)   Wt 280 lb (127 kg)   LMP 07/19/2019   SpO2 98%   BMI 48.82 kg/m   Gen: No acute distress, resting comfortably CV:  Regular rate and rhythm with no murmurs appreciated Pulm: Normal work of breathing, clear to auscultation bilaterally with no crackles, wheezes, or rhonchi Neuro: Grossly normal, moves all extremities Psych: Normal affect and thought content      Kem Hensen M. [name], MD 08/02/2019 2:37 PM

## 2019-08-02 NOTE — Assessment & Plan Note (Signed)
Given likely PCOS will start Metformin.  Will increase as tolerated.  Would likely benefit from addition of GLP-1 agonist in the future depending on response to Metformin.

## 2019-08-02 NOTE — Assessment & Plan Note (Signed)
Potentially could explain several of her symptoms.  She has not a candidate for hormonal contraceptives given her history of migraines with aura.  Given her current current elevated blood pressure readings and desire to lose weight, will start both spironolactone 50 mg daily and Metformin 750 mg daily.  She will follow-up in 4 to 6 weeks.

## 2019-08-12 ENCOUNTER — Ambulatory Visit: Payer: BC Managed Care – PPO | Admitting: Family Medicine

## 2019-08-12 ENCOUNTER — Other Ambulatory Visit: Payer: Self-pay

## 2019-08-12 ENCOUNTER — Telehealth: Payer: Self-pay | Admitting: Family Medicine

## 2019-08-12 ENCOUNTER — Encounter: Payer: Self-pay | Admitting: Family Medicine

## 2019-08-12 VITALS — BP 158/88 | HR 81 | Temp 98.3°F | Wt 285.2 lb

## 2019-08-12 DIAGNOSIS — B373 Candidiasis of vulva and vagina: Secondary | ICD-10-CM

## 2019-08-12 DIAGNOSIS — I10 Essential (primary) hypertension: Secondary | ICD-10-CM

## 2019-08-12 DIAGNOSIS — B3731 Acute candidiasis of vulva and vagina: Secondary | ICD-10-CM

## 2019-08-12 MED ORDER — FLUCONAZOLE 150 MG PO TABS: 150.0000 mg | ORAL_TABLET | ORAL | 0 refills | Status: DC | PRN

## 2019-08-12 NOTE — Patient Instructions (Signed)
It was very nice to see you today!  Please take the diflucan.  I will see you back in 1 months.   Take care, Dr [name]  Please try these tips to maintain a healthy lifestyle:   Eat at least 3 REAL meals and 1-2 snacks per day.  Aim for no more than 5 hours between eating.  If you eat breakfast, please do so within one hour of getting up.    Each meal should contain half fruits/vegetables, one quarter protein, and one quarter carbs (no bigger than a computer mouse)   Cut down on sweet beverages. This includes juice, soda, and sweet tea.     Drink at least 1 glass of water with each meal and aim for at least 8 glasses per day   Exercise at least 150 minutes every week.

## 2019-08-12 NOTE — Telephone Encounter (Signed)
Patient called in this morning saying that his wife was having a reaction to the two new medicines she was prescribed, she has been having a rash for 3-4 days. Had patient triaged.Marland Kitchen

## 2019-08-12 NOTE — Telephone Encounter (Signed)
Noted. Will see patient this afternoon.  Debra Miller. [name], MD 08/12/2019 12:45 PM

## 2019-08-12 NOTE — Progress Notes (Signed)
   Debra Miller is a 39 y.o. female who presents today for an office visit.  Assessment/Plan:  New/Acute Problems: Yeast vaginitis Secondary to recent antibiotics.  No red flags. Will send in Diflucan.  Chronic Problems Addressed Today: Essential hypertension Improving though still above goal.  We will continue spironolactone 50 mg daily.  Follow-up in 4 weeks.     Subjective:  HPI:  Patient concerned she may have vaginal yeast infection.  Has recently been on course of Augmentin due to otitis externa.  Symptoms started about a week ago.  She has had thick white discharge and rash in area.  Has tried using Monistat with no improvement.  She was also recently started on Metformin and spironolactone about a week ago for elevated blood pressure readings and likely PCOS.  She has not noticed any change in her symptoms since then.       Objective:  Physical Exam: BP (!) 158/88   Pulse 81   Temp 98.3 F (36.8 C)   Wt 285 lb 3.2 oz (129.4 kg)   LMP 07/19/2019   SpO2 98%   BMI 49.73 kg/m   Gen: No acute distress, resting comfortably CV: Regular rate and rhythm with no murmurs appreciated Pulm: Normal work of breathing, clear to auscultation bilaterally with no crackles, wheezes, or rhonchi Neuro: Grossly normal, moves all extremities Psych: Normal affect and thought content      Danen Lapaglia M. [name], MD 08/12/2019 1:24 PM

## 2019-08-12 NOTE — Telephone Encounter (Signed)
FYI

## 2019-08-12 NOTE — Assessment & Plan Note (Signed)
Improving though still above goal.  We will continue spironolactone 50 mg daily.  Follow-up in 4 weeks.

## 2019-09-10 ENCOUNTER — Ambulatory Visit: Payer: BC Managed Care – PPO | Admitting: Family Medicine

## 2019-09-10 ENCOUNTER — Other Ambulatory Visit: Payer: Self-pay

## 2019-09-10 ENCOUNTER — Encounter: Payer: Self-pay | Admitting: Family Medicine

## 2019-09-10 DIAGNOSIS — E282 Polycystic ovarian syndrome: Secondary | ICD-10-CM | POA: Diagnosis not present

## 2019-09-10 DIAGNOSIS — M549 Dorsalgia, unspecified: Secondary | ICD-10-CM | POA: Diagnosis not present

## 2019-09-10 DIAGNOSIS — L989 Disorder of the skin and subcutaneous tissue, unspecified: Secondary | ICD-10-CM | POA: Diagnosis not present

## 2019-09-10 DIAGNOSIS — I10 Essential (primary) hypertension: Secondary | ICD-10-CM

## 2019-09-10 MED ORDER — SPIRONOLACTONE 50 MG PO TABS: 100.0000 mg | ORAL_TABLET | Freq: Every day | ORAL | 5 refills | Status: DC

## 2019-09-10 MED ORDER — METRONIDAZOLE 500 MG PO TABS: 500.0000 mg | ORAL_TABLET | Freq: Two times a day (BID) | ORAL | 0 refills | Status: AC

## 2019-09-10 MED ORDER — DICLOFENAC SODIUM 75 MG PO TBEC: 75.0000 mg | DELAYED_RELEASE_TABLET | Freq: Two times a day (BID) | ORAL | 0 refills | Status: DC

## 2019-09-10 MED ORDER — METFORMIN HCL ER 750 MG PO TB24: 1500.0000 mg | ORAL_TABLET | Freq: Every day | ORAL | 5 refills | Status: DC

## 2019-09-10 NOTE — Assessment & Plan Note (Signed)
Stable.  Would like to defer further management at this time.  May benefit from referral to sports medicine in the future.

## 2019-09-10 NOTE — Assessment & Plan Note (Addendum)
Stable.  Will increase Metformin to 1500 mg daily and spironolactone to 100 mg daily.  Follow-up in 4 to 6 weeks.  Could consider trial of progestin only pill depending on response to Metformin.

## 2019-09-10 NOTE — Patient Instructions (Signed)
It was very nice to see you today!  Please increase your Metformin to 1500 mg daily and spironolactone to 100 mg daily.  Please keep an eye on your blood pressure check in with me in 4 to 6 weeks.  You probably have bacterial vaginosis due to your recent antibiotics.  Please start the Flagyl 1 pill twice daily for the next 7 days.  You can also start a probiotic called femdophilus which will help with condition good bacteria your space to have.  I think you have golfers elbow.  Please start the diclofenac 1 pill twice daily for the next 1 to 2 weeks.  Please work on exercises.  We hear from you again in 4-6 weeks.   Take care, Dr [name]  Please try these tips to maintain a healthy lifestyle:   Eat at least 3 REAL meals and 1-2 snacks per day.  Aim for no more than 5 hours between eating.  If you eat breakfast, please do so within one hour of getting up.    Each meal should contain half fruits/vegetables, one quarter protein, and one quarter carbs (no bigger than a computer mouse)   Cut down on sweet beverages. This includes juice, soda, and sweet tea.     Drink at least 1 glass of water with each meal and aim for at least 8 glasses per day   Exercise at least 150 minutes every week.

## 2019-09-10 NOTE — Assessment & Plan Note (Addendum)
No significant change in his last visit.  Will increase Metformin to 1500 mg daily.  She will follow-up in 4 to 6 weeks.  May benefit from addition of Ozempic depending on response to metformi.

## 2019-09-10 NOTE — Progress Notes (Signed)
   Debra Miller is a 39 y.o. female who presents today for an office visit.  Assessment/Plan:  New/Acute Problems: Medial epicondylitis No red flags.  Will start diclofenac 75 mg twice daily for the next 1 to 2 weeks.  Discussed home exercises and handout was given.  Bacterial vaginosis We will empirically start Flagyl today.  Patient declined vaginal swab.  Recommended probiotics as well.  Skin Lesion  Referral placed to dermatology per patient request.  Chronic Problems Addressed Today: Morbid obesity ([place]) No significant change in his last visit.  Will increase Metformin to 1500 mg daily.  She will follow-up in 4 to 6 weeks.  May benefit from addition of Ozempic depending on response to metformi.   PCOS (polycystic ovarian syndrome) Stable.  Will increase Metformin to 1500 mg daily and spironolactone to 100 mg daily.  Follow-up in 4 to 6 weeks.  Could consider trial of progestin only pill depending on response to Metformin.  Back pain Stable.  Would like to defer further management at this time.  May benefit from referral to sports medicine in the future.  Essential hypertension Improving though still above goal.  Will increase spironolactone to 100 mg daily.  Follow-up in 4 to 6 weeks.  We will likely increase to goal of 200 mg daily depending on response.     Subjective:  HPI:  Patient is here for follow-up today.  She last seen about a month ago.  We started her on spironolactone and Metformin for PCOS, obesity, and elevated blood pressure use.  She has not noticed any significant change with either these medications.  She has been compliant without any missed doses.  She has had some upset stomach with the symptoms seem to be improving.  She still has significant lower extremity edema.  She is not yet sure if her menstrual cycles are becoming more regulated.  She has also had some right arm pain for the past couple of weeks.  Pain predominantly located on the inner aspect  of her right elbow.  Occasionally shoot throughout her entire arm.  Worse with certain motions.  She is also having some increased vaginal discharge with a foul odor.  She was treated for a yeast infection about a month ago after completing a course of antibiotics for otitis externa.  She is concerned she may have BV.  Patient has several skin tags in the moles that she would like to be removed on her face and neck.  She request referral to dermatology.       Objective:  Physical Exam: BP (!) 148/86   Pulse 88   Temp 98.7 F (37.1 C)   Ht 5' 3.5" (1.613 m)   Wt 287 lb 3.2 oz (130.3 kg)   SpO2 98%   BMI 50.08 kg/m   Wt Readings from Last 3 Encounters:  09/10/19 287 lb 3.2 oz (130.3 kg)  08/12/19 285 lb 3.2 oz (129.4 kg)  08/02/19 280 lb (127 kg)    Gen: No acute distress, resting comfortably CV: Regular rate and rhythm with no murmurs appreciated Pulm: Normal work of breathing, clear to auscultation bilaterally with no crackles, wheezes, or rhonchi MSK: Right medial epicondyle tender to palpation.  Neurovascular intact distally. Neuro: Grossly normal, moves all extremities Psych: Normal affect and thought content      Paxton Binns M. [name], MD 09/10/2019 11:45 AM

## 2019-09-10 NOTE — Assessment & Plan Note (Signed)
Improving though still above goal.  Will increase spironolactone to 100 mg daily.  Follow-up in 4 to 6 weeks.  We will likely increase to goal of 200 mg daily depending on response.

## 2019-10-01 ENCOUNTER — Other Ambulatory Visit: Payer: Self-pay | Admitting: Family Medicine

## 2019-10-14 ENCOUNTER — Other Ambulatory Visit: Payer: Self-pay | Admitting: Family Medicine

## 2019-10-22 ENCOUNTER — Ambulatory Visit: Payer: BC Managed Care – PPO | Admitting: Family Medicine

## 2019-10-22 ENCOUNTER — Encounter: Payer: Self-pay | Admitting: Family Medicine

## 2019-10-22 ENCOUNTER — Other Ambulatory Visit: Payer: Self-pay

## 2019-10-22 VITALS — BP 142/102 | HR 78 | Temp 98.1°F | Ht 63.5 in | Wt 281.0 lb

## 2019-10-22 DIAGNOSIS — J45909 Unspecified asthma, uncomplicated: Secondary | ICD-10-CM

## 2019-10-22 DIAGNOSIS — G4730 Sleep apnea, unspecified: Secondary | ICD-10-CM | POA: Diagnosis not present

## 2019-10-22 DIAGNOSIS — L989 Disorder of the skin and subcutaneous tissue, unspecified: Secondary | ICD-10-CM | POA: Diagnosis not present

## 2019-10-22 DIAGNOSIS — G4733 Obstructive sleep apnea (adult) (pediatric): Secondary | ICD-10-CM | POA: Insufficient documentation

## 2019-10-22 DIAGNOSIS — I10 Essential (primary) hypertension: Secondary | ICD-10-CM

## 2019-10-22 DIAGNOSIS — E282 Polycystic ovarian syndrome: Secondary | ICD-10-CM

## 2019-10-22 MED ORDER — DICLOFENAC SODIUM 75 MG PO TBEC: 75.0000 mg | DELAYED_RELEASE_TABLET | Freq: Two times a day (BID) | ORAL | 0 refills | Status: DC

## 2019-10-22 MED ORDER — MONTELUKAST SODIUM 10 MG PO TABS: 10.0000 mg | ORAL_TABLET | Freq: Every day | ORAL | 3 refills | Status: DC
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
## 2019-10-22 NOTE — Assessment & Plan Note (Signed)
Likely has underlying OSA which is contributing to daytime somnolence, fatigue, and elevated blood pressure readings.  Will place referral for sleep evaluation.

## 2019-10-22 NOTE — Assessment & Plan Note (Signed)
Continue Metformin 1500 mg daily.  Follow-up in 3 months.

## 2019-10-22 NOTE — Assessment & Plan Note (Signed)
Still slightly above goal.  We will continue spironolactone 100 mg daily for now.  Will be getting sleep study.  She will follow with me in 3 months.

## 2019-10-22 NOTE — Patient Instructions (Signed)
It was very nice to see you today!  You may have sleep apnea.  We will place a referral for you to get a sleep study.  We will leave all of your medications the same for today.  I will refill your diclofenac.  We will also start you on Singulair for the next couple weeks during pollen season.  Please come back to see me in 3 months, or sooner if needed.  Take care, Dr [name]  Please try these tips to maintain a healthy lifestyle:   Eat at least 3 REAL meals and 1-2 snacks per day.  Aim for no more than 5 hours between eating.  If you eat breakfast, please do so within one hour of getting up.    Each meal should contain half fruits/vegetables, one quarter protein, and one quarter carbs (no bigger than a computer mouse)   Cut down on sweet beverages. This includes juice, soda, and sweet tea.     Drink at least 1 glass of water with each meal and aim for at least 8 glasses per day   Exercise at least 150 minutes every week.

## 2019-10-22 NOTE — Assessment & Plan Note (Signed)
Worsened due to pollen outbreak.  Start Singulair 10 mg daily.  Discussed potential side effects.

## 2019-10-22 NOTE — Assessment & Plan Note (Signed)
Down 6 pounds since last visit.  Continue Metformin 1500 mg daily.  Follow-up in 3 months.  Will consider restarting Ozempic at that time.

## 2019-10-23 ENCOUNTER — Telehealth: Payer: Self-pay | Admitting: Dermatology

## 2019-10-23 NOTE — Telephone Encounter (Signed)
Patient returning call to schedule referral appointment.

## 2019-10-23 NOTE — Telephone Encounter (Signed)
I called patient back and referral appointment was scheduled.

## 2019-10-28 ENCOUNTER — Institutional Professional Consult (permissible substitution): Payer: Self-pay | Admitting: Neurology

## 2019-10-28 ENCOUNTER — Encounter: Payer: Self-pay | Admitting: Neurology

## 2019-10-28 ENCOUNTER — Telehealth: Payer: Self-pay

## 2019-10-28 NOTE — Telephone Encounter (Signed)
Patient no showed 10/28/2019 new pt appointment with Dr. Rexene Alberts.

## 2019-11-12 ENCOUNTER — Ambulatory Visit (INDEPENDENT_AMBULATORY_CARE_PROVIDER_SITE_OTHER): Payer: BC Managed Care – PPO | Admitting: Neurology

## 2019-11-12 ENCOUNTER — Other Ambulatory Visit: Payer: Self-pay

## 2019-11-12 ENCOUNTER — Encounter: Payer: Self-pay | Admitting: Neurology

## 2019-11-12 VITALS — BP 145/96 | HR 79 | Temp 97.2°F | Ht 64.0 in | Wt 280.0 lb

## 2019-11-12 DIAGNOSIS — G4719 Other hypersomnia: Secondary | ICD-10-CM

## 2019-11-12 DIAGNOSIS — E282 Polycystic ovarian syndrome: Secondary | ICD-10-CM | POA: Diagnosis not present

## 2019-11-12 DIAGNOSIS — Z6841 Body Mass Index (BMI) 40.0 and over, adult: Secondary | ICD-10-CM

## 2019-11-12 DIAGNOSIS — J4520 Mild intermittent asthma, uncomplicated: Secondary | ICD-10-CM

## 2019-11-12 DIAGNOSIS — G2581 Restless legs syndrome: Secondary | ICD-10-CM

## 2019-11-12 MED ORDER — ALPRAZOLAM 0.5 MG PO TABS: 0.5000 mg | ORAL_TABLET | Freq: Every evening | ORAL | 0 refills | Status: DC | PRN
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
## 2019-11-12 NOTE — Patient Instructions (Signed)
Please remember to try to maintain good sleep hygiene, which means: Keep a regular sleep and wake schedule, try not to exercise or have a meal within 2 hours of your bedtime, try to keep your bedroom conducive for sleep, that is, cool and dark, without light distractors such as an illuminated alarm clock, and refrain from watching TV right before sleep or in the middle of the night and do not keep the TV or radio on during the night. Also, try not to use or play on electronic devices at bedtime, such as your cell phone, tablet PC or laptop. If you like to read at bedtime on an electronic device, try to dim the background light as much as possible. Do not eat in the middle of the night.   We will request a sleep study.    We will look for leg twitching and snoring or sleep apnea.   For chronic insomnia, you are best followed by a psychiatrist and/or sleep psychologist.   We will call you with the sleep study results and make a follow up appointment if needed.     Quality Sleep Information, Adult Quality sleep is important for your mental and physical health. It also improves your quality of life. Quality sleep means you:  Are asleep for most of the time you are in bed.  Fall asleep within 30 minutes.  Wake up no more than once a night.  Are awake for no longer than 20 minutes if you do wake up during the night. Most adults need 7-8 hours of quality sleep each night. How can poor sleep affect me? If you do not get enough quality sleep, you may have:  Mood swings.  Daytime sleepiness.  Confusion.  Decreased reaction time.  Sleep disorders, such as insomnia and sleep apnea.  Difficulty with: ? Solving problems. ? Coping with stress. ? Paying attention. These issues may affect your performance and productivity at work, school, and at home. Lack of sleep may also put you at higher risk for accidents, suicide, and risky behaviors. If you do not get quality sleep you may also be at  higher risk for several health problems, including:  Infections.  Type 2 diabetes.  Heart disease.  High blood pressure.  Obesity.  Worsening of long-term conditions, like arthritis, kidney disease, depression, Parkinson's disease, and epilepsy. What actions can I take to get more quality sleep?      Stick to a sleep schedule. Go to sleep and wake up at about the same time each day. Do not try to sleep less on weekdays and make up for lost sleep on weekends. This does not work.  Try to get about 30 minutes of exercise on most days. Do not exercise 2-3 hours before going to bed.  Limit naps during the day to 30 minutes or less.  Do not use any products that contain nicotine or tobacco, such as cigarettes or e-cigarettes. If you need help quitting, ask your health care provider.  Do not drink caffeinated beverages for at least 8 hours before going to bed. Coffee, tea, and some sodas contain caffeine.  Do not drink alcohol close to bedtime.  Do not eat large meals close to bedtime.  Do not take naps in the late afternoon.  Try to get at least 30 minutes of sunlight every day. Morning sunlight is best.  Make time to relax before bed. Reading, listening to music, or taking a hot bath promotes quality sleep.  Make your bedroom a  place that promotes quality sleep. Keep your bedroom dark, quiet, and at a comfortable room temperature. Make sure your bed is comfortable. Take out sleep distractions like TV, a computer, smartphone, and bright lights.  If you are lying awake in bed for longer than 20 minutes, get up and do a relaxing activity until you feel sleepy.  Work with your health care provider to treat medical conditions that may affect sleeping, such as: ? Nasal obstruction. ? Snoring. ? Sleep apnea and other sleep disorders.  Talk to your health care provider if you think any of your prescription medicines may cause you to have difficulty falling or staying asleep.  If  you have sleep problems, talk with a sleep consultant. If you think you have a sleep disorder, talk with your health care provider about getting evaluated by a specialist. Where to find more information  [place] website: https://sleepfoundation.org  National Heart, Lung, and [place]): http://www.saunders.info/.pdf  Centers for Disease Control and Prevention (CDC): LearningDermatology.pl Contact a health care provider if you:  Have trouble getting to sleep or staying asleep.  Often wake up very early in the morning and cannot get back to sleep.  Have daytime sleepiness.  Have daytime sleep attacks of suddenly falling asleep and sudden muscle weakness (narcolepsy).  Have a tingling sensation in your legs with a strong urge to move your legs (restless legs syndrome).  Stop breathing briefly during sleep (sleep apnea).  Think you have a sleep disorder or are taking a medicine that is affecting your quality of sleep. Summary  Most adults need 7-8 hours of quality sleep each night.  Getting enough quality sleep is an important part of health and well-being.  Make your bedroom a place that promotes quality sleep and avoid things that may cause you to have poor sleep, such as alcohol, caffeine, smoking, and large meals.  Talk to your health care provider if you have trouble falling asleep or staying asleep. This information is not intended to replace advice given to you by your health care provider. Make sure you discuss any questions you have with your health care provider. Document Revised: 09/27/2017 Document Reviewed: 09/27/2017 Elsevier Patient Education  [place].

## 2019-11-18 ENCOUNTER — Other Ambulatory Visit: Payer: Self-pay

## 2019-11-18 ENCOUNTER — Encounter: Payer: Self-pay | Admitting: Family Medicine

## 2019-11-18 MED ORDER — AMOXICILLIN-POT CLAVULANATE 875-125 MG PO TABS: 1.0000 | ORAL_TABLET | Freq: Two times a day (BID) | ORAL | 0 refills | Status: DC

## 2019-11-18 NOTE — Telephone Encounter (Signed)
Medication sent to pharmacy  

## 2019-11-18 NOTE — Telephone Encounter (Signed)
Patient returning call to [facility]. Please advise

## 2019-11-19 ENCOUNTER — Telehealth: Payer: Self-pay

## 2019-11-19 NOTE — Telephone Encounter (Signed)
Spoke with pt, confirmed that she received rx refill. Pt agreed.

## 2019-11-19 NOTE — Telephone Encounter (Signed)
LVM for pt to call me back to schedule sleep study  

## 2019-11-26 ENCOUNTER — Emergency Department (HOSPITAL_COMMUNITY)
Admission: EM | Admit: 2019-11-26 | Discharge: 2019-11-26 | Disposition: A | Discharge status: Home / Self Care | Payer: BC Managed Care – PPO | Attending: Emergency Medicine | Admitting: Emergency Medicine

## 2019-11-26 ENCOUNTER — Other Ambulatory Visit: Payer: Self-pay

## 2019-11-26 ENCOUNTER — Encounter (HOSPITAL_COMMUNITY): Payer: Self-pay

## 2019-11-26 ENCOUNTER — Emergency Department (HOSPITAL_BASED_OUTPATIENT_CLINIC_OR_DEPARTMENT_OTHER): Payer: BC Managed Care – PPO

## 2019-11-26 DIAGNOSIS — M79609 Pain in unspecified limb: Secondary | ICD-10-CM

## 2019-11-26 DIAGNOSIS — I10 Essential (primary) hypertension: Secondary | ICD-10-CM | POA: Diagnosis not present

## 2019-11-26 DIAGNOSIS — J45909 Unspecified asthma, uncomplicated: Secondary | ICD-10-CM | POA: Diagnosis not present

## 2019-11-26 DIAGNOSIS — Z79899 Other long term (current) drug therapy: Secondary | ICD-10-CM | POA: Diagnosis not present

## 2019-11-26 DIAGNOSIS — M79661 Pain in right lower leg: Secondary | ICD-10-CM | POA: Insufficient documentation

## 2019-11-26 DIAGNOSIS — Z7984 Long term (current) use of oral hypoglycemic drugs: Secondary | ICD-10-CM | POA: Insufficient documentation

## 2019-11-26 LAB — POC URINE PREG, ED: Preg Test, Ur: NEGATIVE

## 2019-11-26 MED ORDER — METHOCARBAMOL 750 MG PO TABS
750.0000 mg | ORAL_TABLET | Freq: Two times a day (BID) | ORAL | 0 refills | Status: DC | PRN
Start: 2019-11-26 — End: 2020-03-12

## 2019-11-26 NOTE — ED Notes (Signed)
Pt given specimen cup and reminded a urine sample is needed.

## 2019-11-26 NOTE — ED Triage Notes (Signed)
Pt c/o pain to back of right calf x4 days. Pt denies injury or trauma to leg. Pt concerned for blood clot.

## 2019-11-26 NOTE — Progress Notes (Signed)
Venous duplex       has been completed. Preliminary results can be found under CV proc through chart review. Isaiyah Feldhaus, BS, RDMS, RVT   

## 2019-11-26 NOTE — Discharge Instructions (Addendum)
  Antiinflammatory medications: Take 600 mg of ibuprofen every 6 hours or 440 mg (over the counter dose) to 500 mg (prescription dose) of naproxen every 12 hours for the next 3 days. After this time, these medications may be used as needed for pain. Take these medications with food to avoid upset stomach. Choose only one of these medications, do not take them together. Acetaminophen (generic for Tylenol): Should you continue to have additional pain while taking the ibuprofen or naproxen, you may add in acetaminophen as needed. Your daily total maximum amount of acetaminophen from all sources should be limited to 4000mg /day for persons without liver problems, or 2000mg /day for those with liver problems. Methocarbamol: Methocarbamol (generic for Robaxin) is a muscle relaxer and can help relieve stiff muscles or muscle spasms.  Do not drive or perform other dangerous activities while taking this medication as it can cause drowsiness as well as changes in reaction time and judgement. Ice: May apply ice to the area over the next 24 hours for 15 minutes at a time to reduce pain, inflammation, and swelling, if present. Follow up: Follow up with a primary care provider for any future management of these complaints. Be sure to follow up within 7-10 days. Return: Return to the ED should symptoms worsen.  For prescription assistance, may try using prescription discount sites or apps, such as goodrx.com

## 2019-11-26 NOTE — ED Notes (Signed)
An After Visit Summary was printed and given to the patient. Discharge instructions given and no further questions at this time.  

## 2019-11-26 NOTE — ED Provider Notes (Signed)
[facility] DEPT Provider Note   CSN: [id number] Arrival date & time: 11/26/19  1121     History Chief Complaint  Patient presents with  . Right Leg Pain    Debra Miller is a 39 y.o. female.  HPI     Debra Miller is a 39 y.o. female, with a history of bipolar, asthma, HTN, hypercholesterolemia, presenting to the ED with right calf pain for about the last 4 days. Pain is aching, moderate to severe, intermittent, small spasms in the calf, nonradiating. She has not experienced this pain or similar pain before. Denies history of DVT/PE, recent immobilization, recent surgery, recent trauma. Denies fever/chills, persistent numbness, weakness, acute edema, persistent chest pain, persistent shortness of breath, or any other complaints.  Past Medical History:  Diagnosis Date  . Asthma   . Bipolar disorder ([place])   . Chicken pox   . 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 Depression   . Dizziness   . Dysmenorrhea   . Eczema   . Elevated triglycerides with high cholesterol   . Frequent headaches   . GERD (gastroesophageal reflux disease)   . H/O cold sores   . High cholesterol   . Hypertension    high blood pressure readings   . Migraines    with aura  . Seasonal allergies   . Urine incontinence   . UTI (urinary tract infection)     Patient Active Problem List   Diagnosis Date Noted  . Sleep-disordered breathing 10/22/2019  . Low vitamin D level 08/02/2019  . PCOS (polycystic ovarian syndrome) 08/02/2019  . Back pain 08/02/2019  . Essential hypertension 08/02/2019  . Asthma 07/23/2019  . Bipolar disorder, unspecified ([place]) 01/28/2013  . Migraines 01/28/2013  . Morbid obesity ([place]) 01/28/2013    Past Surgical History:  Procedure Laterality Date  . KNEE SURGERY    . TONSILLECTOMY AND ADENOIDECTOMY    . TUBAL LIGATION       OB History    Gravida  7   Para  5   Term  5   Preterm       AB  2   Living  5     SAB  2   TAB      Ectopic      Multiple      Live Births              Family History  Problem Relation Age of Onset  . Arthritis Mother   . High Cholesterol Mother   . Hypertension Mother   . Diabetes Mother   . Rheum arthritis Mother   . Asthma Mother   . Hyperlipidemia Mother   . Migraines Mother   . Thyroid disease Mother   . Arthritis Maternal Grandmother   . Breast cancer Maternal Grandmother   . High Cholesterol Maternal Grandmother   . Stroke Maternal Grandmother   . Hypertension Maternal Grandmother   . Migraines Maternal Grandmother     Social History   Tobacco Use  . Smoking status: Never Smoker  . Smokeless tobacco: Never Used  Substance Use Topics  . Alcohol use: No    Alcohol/week: 0.0 standard drinks  . Drug use: No    Home Medications Prior to Admission medications   Medication Sig Start Date End Date Taking? Authorizing Provider  albuterol (VENTOLIN HFA) 108 (90 Base) MCG/ACT inhaler TAKE 2 PUFFS BY MOUTH EVERY 6 HOURS AS NEEDED FOR WHEEZE OR  SHORTNESS OF BREATH 10/01/19   Vivi Barrack, MD  ALPRAZolam Duanne Moron) 0.5 MG tablet Take 1 tablet (0.5 mg total) by mouth at bedtime as needed for anxiety. 11/12/19   Dohmeier, Asencion Partridge, MD  amoxicillin-clavulanate (AUGMENTIN) 875-125 MG tablet Take 1 tablet by mouth 2 (two) times daily. 11/18/19   Vivi Barrack, MD  diclofenac (VOLTAREN) 75 MG EC tablet Take 1 tablet (75 mg total) by mouth 2 (two) times daily. 10/22/19   Vivi Barrack, MD  metFORMIN (GLUCOPHAGE XR) 750 MG 24 hr tablet Take 2 tablets (1,500 mg total) by mouth daily with breakfast. 09/10/19   Vivi Barrack, MD  methocarbamol (ROBAXIN) 750 MG tablet Take 1 tablet (750 mg total) by mouth 2 (two) times daily as needed for muscle spasms (or muscle tightness). 11/26/19   Paeton Latouche C, PA-C  montelukast (SINGULAIR) 10 MG tablet Take 1 tablet (10 mg total) by mouth at bedtime. 10/22/19   Vivi Barrack, MD  Multiple  Vitamin (MULTIVITAMIN) tablet Take 1 tablet by mouth daily.    [provider]  spironolactone (ALDACTONE) 50 MG tablet Take 2 tablets (100 mg total) by mouth daily. 09/10/19   Vivi Barrack, MD  hydrochlorothiazide (HYDRODIURIL) 12.5 MG tablet Take 1 po QD prn swelling Patient not taking: Reported on 11/24/2017 12/11/16 05/03/19  Rolland Porter, MD  potassium chloride 20 MEQ TBCR Take 1 po with the hydrochlorothiazide. Patient not taking: Reported on 11/24/2017 12/11/16 05/03/19  Rolland Porter, MD    Allergies    Ceclor [cefaclor] and Pediapred [prednisolone sodium phosphate]  Review of Systems   Review of Systems  Constitutional: Negative for chills and fever.  Respiratory: Negative for cough and shortness of breath.   Cardiovascular: Negative for chest pain and leg swelling.  Gastrointestinal: Negative for abdominal pain, diarrhea, nausea and vomiting.  Musculoskeletal: Positive for myalgias.  Neurological: Negative for weakness and numbness.  All other systems reviewed and are negative.   Physical Exam Updated Vital Signs BP (!) 154/101   Pulse 80   Temp 98.4 F (36.9 C) (Oral)   Resp 18   SpO2 100%   Physical Exam Vitals and nursing note reviewed.  Constitutional:      General: She is not in acute distress.    Appearance: She is well-developed. She is obese. She is not diaphoretic.  HENT:     Head: Normocephalic and atraumatic.     Mouth/Throat:     Mouth: Mucous membranes are moist.     Pharynx: Oropharynx is clear.  Eyes:     Conjunctiva/sclera: Conjunctivae normal.  Cardiovascular:     Rate and Rhythm: Normal rate and regular rhythm.     Pulses: Normal pulses.          Radial pulses are 2+ on the right side and 2+ on the left side.       Dorsalis pedis pulses are 2+ on the right side and 2+ on the left side.       Posterior tibial pulses are 2+ on the right side and 2+ on the left side.     Comments: Tactile temperature in the extremities appropriate and equal  bilaterally. Pulmonary:     Effort: Pulmonary effort is normal. No respiratory distress.     Breath sounds: Normal breath sounds.  Abdominal:     Palpations: Abdomen is soft.     Tenderness: There is no abdominal tenderness. There is no guarding.  Musculoskeletal:     Cervical back: Neck supple.  Right lower leg: No edema.     Left lower leg: No edema.     Comments: Tenderness to the right calf.  No noted edema, erythema, or increased warmth. No tenderness, swelling, increased warmth, or pain with range of motion to the right knee or ankle.  Lymphadenopathy:     Cervical: No cervical adenopathy.  Skin:    General: Skin is warm and dry.  Neurological:     Mental Status: She is alert.     Comments: Sensation grossly intact to light touch in the lower extremities bilaterally.  Strength 5/5 in the bilateral lower extremities. No noted gait deficit. Coordination intact.  Psychiatric:        Mood and Affect: Mood and affect normal.        Speech: Speech normal.        Behavior: Behavior normal.     ED Results / Procedures / Treatments   Labs (all labs ordered are listed, but only abnormal results are displayed) Labs Reviewed  POC URINE PREG, ED    EKG None  Radiology VAS [place] LOWER EXTREMITY VENOUS (DVT) (ONLY [facility] & [facility] 7a-7p)  Result Date: 11/26/2019  Lower Venous DVTStudy Indications: Calf pain.  Comparison Study: 11/30/17 negative Performing Technologist: June Leap RDMS, RVT  Examination Guidelines: A complete evaluation includes B-mode imaging, spectral Doppler, color Doppler, and power Doppler as needed of all accessible portions of each vessel. Bilateral testing is considered an integral part of a complete examination. Limited examinations for reoccurring indications may be performed as noted. The reflux portion of the exam is performed with the patient in reverse Trendelenburg.  +---------+---------------+---------+-----------+----------+--------------+ RIGHT     CompressibilityPhasicitySpontaneityPropertiesThrombus Aging +---------+---------------+---------+-----------+----------+--------------+ CFV      Full           Yes      Yes                                 +---------+---------------+---------+-----------+----------+--------------+ SFJ      Full                                                        +---------+---------------+---------+-----------+----------+--------------+ FV Prox  Full                                                        +---------+---------------+---------+-----------+----------+--------------+ FV Mid   Full                                                        +---------+---------------+---------+-----------+----------+--------------+ FV DistalFull                                                        +---------+---------------+---------+-----------+----------+--------------+ PFV      Full                                                        +---------+---------------+---------+-----------+----------+--------------+  POP      Full           Yes      Yes                                 +---------+---------------+---------+-----------+----------+--------------+ PTV      Full                                                        +---------+---------------+---------+-----------+----------+--------------+ PERO     Full                                                        +---------+---------------+---------+-----------+----------+--------------+     Summary: RIGHT: - There is no evidence of deep vein thrombosis in the lower extremity.  - No cystic structure found in the popliteal fossa.   *See table(s) above for measurements and observations.    Preliminary     Procedures Procedures (including critical care time)  Medications Ordered in ED Medications - No data to display  ED Course  I have reviewed the triage vital signs and the nursing notes.  Pertinent labs &  imaging results that were available during my care of the patient were reviewed by me and considered in my medical decision making (see chart for details).    MDM Rules/Calculators/A&P                      Patient presents with right calf pain.  No evidence of neurovascular compromise.  No acute abnormality noted on duplex ultrasound. The patient was given instructions for home care as well as return precautions. Patient voices understanding of these instructions, accepts the plan, and is comfortable with discharge.    Final Clinical Impression(s) / ED Diagnoses Final diagnoses:  Right calf pain    Rx / DC Orders ED Discharge Orders         Ordered    methocarbamol (ROBAXIN) 750 MG tablet  2 times daily PRN     11/26/19 1424           Lorayne Bender, PA-C 11/26/19 1429    Lacretia Leigh, MD 11/27/19 1259

## 2019-11-27 ENCOUNTER — Telehealth: Payer: Self-pay

## 2019-11-27 NOTE — Telephone Encounter (Signed)
Spoke to pt, asked her if this message was her or husband that needed to see a specialist? Pt said her, I was seen in the ED yesterday and was told to follow up with PCP or Orthopedic for further evaluation of calf pain. Told pt let me check with Dr. [name] and see if I can get you in to see him today and I will call you back. Pt verbalized understanding.

## 2019-11-27 NOTE — Telephone Encounter (Signed)
Please see message and advise 

## 2019-11-27 NOTE — Telephone Encounter (Signed)
Schedules are full here today and at [facility]. Asked Dr. Rogers Blocker if she would see pt due to Dr. [name] is booked. Dr. Rogers Blocker said she can see pt tomorrow.   [place] called pt and scheduled her to see Dr. Rogers Blocker tomorrow at 1:40 PM.

## 2019-11-27 NOTE — Telephone Encounter (Signed)
Patient husband states his was seen in ER for blockage artery was told to call PCP to get a referral for specialist. Husband Broadus John best contact number is [phone]

## 2019-11-27 NOTE — Telephone Encounter (Signed)
Discussed pt with Dr. [name] regarding ED visit and pt to follow up. Dr. [name] said he can not see pt today due to schedule full  see if another provider will see her.

## 2019-11-27 NOTE — Telephone Encounter (Signed)
Can we get more information - her husband had a blocked artery or the patient had a blocked artery?

## 2019-11-28 ENCOUNTER — Other Ambulatory Visit: Payer: Self-pay

## 2019-11-28 ENCOUNTER — Ambulatory Visit (INDEPENDENT_AMBULATORY_CARE_PROVIDER_SITE_OTHER): Payer: BC Managed Care – PPO | Admitting: Family Medicine

## 2019-11-28 ENCOUNTER — Encounter: Payer: Self-pay | Admitting: Family Medicine

## 2019-11-28 VITALS — BP 122/92 | HR 84 | Temp 98.1°F | Ht 64.0 in | Wt 277.4 lb

## 2019-11-28 DIAGNOSIS — M62831 Muscle spasm of calf: Secondary | ICD-10-CM

## 2019-11-28 DIAGNOSIS — D1724 Benign lipomatous neoplasm of skin and subcutaneous tissue of left leg: Secondary | ICD-10-CM

## 2019-11-28 DIAGNOSIS — R202 Paresthesia of skin: Secondary | ICD-10-CM

## 2019-11-28 DIAGNOSIS — R200 Anesthesia of skin: Secondary | ICD-10-CM | POA: Diagnosis not present

## 2019-11-28 LAB — COMPREHENSIVE METABOLIC PANEL
ALT: 38 U/L — ABNORMAL HIGH (ref 0–35)
AST: 25 U/L (ref 0–37)
Albumin: 5 g/dL (ref 3.5–5.2)
Alkaline Phosphatase: 70 U/L (ref 39–117)
BUN: 16 mg/dL (ref 6–23)
CO2: 26 mEq/L (ref 19–32)
Calcium: 10 mg/dL (ref 8.4–10.5)
Chloride: 102 mEq/L (ref 96–112)
Creatinine, Ser: 0.74 mg/dL (ref 0.40–1.20)
GFR: 88.07 mL/min (ref >60.00)
Glucose, Bld: 100 mg/dL — ABNORMAL HIGH (ref 70–99)
Potassium: 4.6 mEq/L (ref 3.5–5.1)
Sodium: 136 mEq/L (ref 135–145)
Total Bilirubin: 0.6 mg/dL (ref 0.2–1.2)
Total Protein: 7.4 g/dL (ref 6.0–8.3)

## 2019-11-28 LAB — CBC WITH DIFFERENTIAL/PLATELET
Basophils Absolute: 0.1 10*3/uL (ref 0.0–0.1)
Basophils Relative: 0.6 % (ref 0.0–3.0)
Eosinophils Absolute: 0.2 10*3/uL (ref 0.0–0.7)
Eosinophils Relative: 2.5 % (ref 0.0–5.0)
HCT: 42.7 % (ref 36.0–46.0)
Hemoglobin: 14.5 g/dL (ref 12.0–15.0)
Lymphocytes Relative: 33.6 % (ref 12.0–46.0)
Lymphs Abs: 3 10*3/uL (ref 0.7–4.0)
MCHC: 33.9 g/dL (ref 30.0–36.0)
MCV: 88 fl (ref 78.0–100.0)
Monocytes Absolute: 0.5 10*3/uL (ref 0.1–1.0)
Monocytes Relative: 5.6 % (ref 3.0–12.0)
Neutro Abs: 5.2 10*3/uL (ref 1.4–7.7)
Neutrophils Relative %: 57.7 % (ref 43.0–77.0)
Platelets: 253 10*3/uL (ref 150.0–400.0)
RBC: 4.85 Mil/uL (ref 3.87–5.11)
RDW: 13 % (ref 11.5–15.5)
WBC: 9.1 10*3/uL (ref 4.0–10.5)

## 2019-11-28 LAB — VITAMIN B12: Vitamin B-12: 468 pg/mL (ref 211–911)

## 2019-11-28 LAB — CK: Total CK: 75 U/L (ref 7–177)

## 2019-11-28 MED ORDER — CYCLOBENZAPRINE HCL 10 MG PO TABS
10.0000 mg | ORAL_TABLET | Freq: Three times a day (TID) | ORAL | 0 refills | Status: DC | PRN
Start: 2019-11-28 — End: 2020-03-12

## 2019-11-28 NOTE — Progress Notes (Signed)
Patient: Debra Miller MRN: [id number] DOB: [birth year] PCP: Vivi Barrack, MD     Subjective:  Chief Complaint  Patient presents with  . Leg Pain    Right calf  . lump on left leg    HPI: The patient is a 39 y.o. female who presents today for Right Calf Pain. Starting 6 days ago. She says that she has had the same pain. She was given Methocarbamol in the ED, it has not helped.  She states about 21 years ago she fell and cracked her tailbone and has given her problems ever since. It has gotten worse over the last 1.5 years. She got a knot on the front of her left leg that has gotten bigger since that time. It is painful and no one can tell her what it is.   She states her right leg started to hurt about 8 months ago. It felt like a cramp that wouldn't stay long and then would go away. She states pain is all in the back of her leg. No pain or swelling behind her knee. No swelling in her right calf or redness.  About 6 days ago it started up and it never went away and has gotten worse. She states she has pain in her lower leg and her back/hip as well. Her right calf is not more swollen than the left. Pain is described as cramp/tight. If feels like someone is taking her muscle and squeezing it. She has taken ibuprofen, muscle relaxer form ER and nothing has helped. Heating pad, sitting, walking nothing helps. Keeping her from sleeping. She has had labs done in January with negative ANA. No labs were done in ER. Just imaging- cant see this. She states 4 days her right leg went numb and this lasted for 30 minutes. She states her legs do go numb, but not for that long.   Seen in ED on 11/26/2019. No evidence of neurovascular compromise and duplex ultrasound was normal. Can not find the [place] from the visit.   Review of Systems  Cardiovascular: Negative for chest pain and palpitations.  Gastrointestinal: Negative for abdominal pain, nausea and vomiting.  Musculoskeletal: Positive for arthralgias, back  pain and myalgias. Negative for joint swelling.  Skin: Negative for color change and rash.  Neurological: Negative for dizziness, light-headedness and numbness.    Allergies Patient is allergic to ceclor [cefaclor] and pediapred [prednisolone sodium phosphate].  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  Surgical History Patient  has a past surgical history that includes Tonsillectomy and adenoidectomy; Tubal ligation; and Knee surgery.  Family History Pateint's family history includes Arthritis in her maternal grandmother and mother; Asthma in her mother; Breast cancer in her maternal grandmother; Diabetes in her mother; High Cholesterol in her maternal grandmother and mother; Hyperlipidemia in her mother; Hypertension in her maternal grandmother and mother; Migraines in her maternal grandmother and mother; Rheum arthritis in her mother; Stroke in her maternal grandmother; Thyroid disease in her mother.  Social History Patient  reports that she has never smoked. She has never used smokeless tobacco. She reports that she does not drink alcohol or use drugs.    Objective: Vitals:   11/28/19 1358  BP: (!) 122/92  Pulse: 84  Temp: 98.1 F (36.7 C)  TempSrc: Temporal  SpO2: 99%  Weight: 277 lb 6.4 oz (125.8 kg)  Height: 5\' 4"  (1.626 m)    Body mass index is 47.62 kg/m.  Physical Exam Vitals reviewed.  Constitutional:      Appearance: Normal appearance. She is obese.  HENT:     Head: Normocephalic and atraumatic.  Pulmonary:     Effort: Pulmonary  effort is normal.  Musculoskeletal:        General: Tenderness present. No swelling.     Right lower leg: No edema.     Left lower leg: No edema.     Comments: Subcutaneous mass on anterior left shin. Appears to be lipoma. Ttp.   Right calf with no edema/redness. TTP on latero-posterior and medio-posterior aspect of calf. No mass/cord or abnormality felt. Sensation intact.   Skin:    Capillary Refill: Capillary refill takes less than 2 seconds.     Findings: No erythema or rash.  Neurological:     General: No focal deficit present.     Mental Status: She is alert and oriented to person, place, and time.   ER records reviewed, but ultrasound is not in her chart.      Assessment/plan: 1. Muscle spasm of right calf Can not find ultrasound results in her chart from ER visit, but documentation says normal. Would do a steroid shot, but she can't do this. Will change her over to flexeril to see if some relief with this and advised otc voltaren gel QID. Checking labs to make sure CK not elevated and electrolytes normal. Also will send to sports medicine to see if they can see if related to something else. Will also have her f/u with dr. [name] early next week for her 20+ year hx of back pain.  - CBC with Differential/Platelet - Comprehensive metabolic panel - CK - Ambulatory referral to Sports Medicine  2. Lipoma of left lower extremity  - Ambulatory referral to General Surgery  3. Numbness and tingling of both legs  - Vitamin B12   Precautions given for back pain including loss of sensation in buttocks, foot drop, fever, incontinence to go to ER, otherwise will f/u with dr. [name] next week for chronic back pain    Return in about 5 days (around 12/03/2019) for back pain/leg pain .  Orma Flaming, MD Ali Molina   11/28/2019

## 2019-11-28 NOTE — Patient Instructions (Addendum)
-  will try new muscle relaxer called flexeril, can take up to three times a day but can make your drowsy.   -can't see imaging, let's send to sports medicine and see if they can try some other things or if you need more imaging.   -voltaren gel over the counter to put on back of leg.   -lipoma in left leg: referral to general surgery

## 2019-12-03 ENCOUNTER — Encounter: Payer: Self-pay | Admitting: Family Medicine

## 2019-12-03 ENCOUNTER — Other Ambulatory Visit: Payer: Self-pay | Admitting: *Deleted

## 2019-12-03 DIAGNOSIS — T3695XA Adverse effect of unspecified systemic antibiotic, initial encounter: Secondary | ICD-10-CM

## 2019-12-03 MED ORDER — FLUCONAZOLE 150 MG PO TABS: 150.0000 mg | ORAL_TABLET | Freq: Once | ORAL | 0 refills | Status: AC

## 2019-12-03 NOTE — Telephone Encounter (Signed)
Please advise 

## 2019-12-05 ENCOUNTER — Encounter: Payer: Self-pay | Admitting: Family Medicine

## 2019-12-05 ENCOUNTER — Ambulatory Visit (INDEPENDENT_AMBULATORY_CARE_PROVIDER_SITE_OTHER): Payer: BC Managed Care – PPO

## 2019-12-05 ENCOUNTER — Other Ambulatory Visit: Payer: Self-pay

## 2019-12-05 ENCOUNTER — Ambulatory Visit: Payer: BC Managed Care – PPO | Admitting: Family Medicine

## 2019-12-05 VITALS — BP 130/62 | HR 88 | Temp 98.5°F | Resp 18 | Ht 64.0 in | Wt 279.6 lb

## 2019-12-05 DIAGNOSIS — M5440 Lumbago with sciatica, unspecified side: Secondary | ICD-10-CM

## 2019-12-05 MED ORDER — METHYLPREDNISOLONE ACETATE 80 MG/ML IJ SUSP
80.0000 mg | Freq: Once | INTRAMUSCULAR | Status: AC
  Administered 2019-12-05: 80 mg via INTRAMUSCULAR

## 2019-12-05 MED ORDER — KETOROLAC TROMETHAMINE 60 MG/2ML IM SOLN
60.0000 mg | Freq: Once | INTRAMUSCULAR | Status: AC
  Administered 2019-12-05: 60 mg via INTRAMUSCULAR

## 2019-12-05 NOTE — Assessment & Plan Note (Signed)
Worsened.  With sciatica but no red flags.  Will give 60 mg of Toradol and 80 mg of Depo-Medrol today.  Check low back film.  Has referral to sports medicine pending and will follow up with them next week.  May possibly need advanced imaging in the near future.

## 2019-12-05 NOTE — Patient Instructions (Signed)
It was very nice to see you today!  I think you have a pinched nerve in your back that is causing your leg pain.  We will give you 2 injections today to knock out the inflammation in your back.  We will get an x-ray of your back.  Please follow-up with sports medicine next week.  Take care, Dr [name]  Please try these tips to maintain a healthy lifestyle:   Eat at least 3 REAL meals and 1-2 snacks per day.  Aim for no more than 5 hours between eating.  If you eat breakfast, please do so within one hour of getting up.    Each meal should contain half fruits/vegetables, one quarter protein, and one quarter carbs (no bigger than a computer mouse)   Cut down on sweet beverages. This includes juice, soda, and sweet tea.     Drink at least 1 glass of water with each meal and aim for at least 8 glasses per day   Exercise at least 150 minutes every week.

## 2019-12-05 NOTE — Progress Notes (Signed)
   Debra Miller is a 39 y.o. female who presents today for an office visit.  Assessment/Plan:  Chronic Problems Addressed Today: Back pain Worsened.  With sciatica but no red flags.  Will give 60 mg of Toradol and 80 mg of Depo-Medrol today.  Check low back film.  Has referral to sports medicine pending and will follow up with them next week.  May possibly need advanced imaging in the near future.     Subjective:  HPI:  Patient here for follow-up.  She was seen by Dr. Rogers Blocker last week for right calf pain in the setting of severe right calf pain for about a week prior.  She was also having some paresthesias at the time.  She was seen in the ED on 11/26/2019.  Ultrasound at that time was normal.  She was given a prescription for Flexeril.  Symptoms have not improved.  She still has low back pain that radiates into her right leg.  Still has paresthesias.  Worse with certain motions.  No other obvious aggravating relieving factors.  No bowel or bladder changes.  No urinary incontinence or urinary retention.       Objective:  Physical Exam: BP 130/62   Pulse 88   Temp 98.5 F (36.9 C) (Temporal)   Resp 18   Ht 5\' 4"  (1.626 m)   Wt 279 lb 9.6 oz (126.8 kg)   SpO2 98%   BMI 47.99 kg/m    Wt Readings from Last 3 Encounters:  12/05/19 279 lb 9.6 oz (126.8 kg)  11/28/19 277 lb 6.4 oz (125.8 kg)  11/12/19 280 lb (127 kg)   Gen: No acute distress, resting comfortably CV: Regular rate and rhythm with no murmurs appreciated Pulm: Normal work of breathing, clear to auscultation bilaterally with no crackles, wheezes, or rhonchi MSK:  - Back: No deformities.  Tender to palpation along bilateral lumbar spine -Lower extremities: Neurovascular intact distally.  2+ DP pulses bilaterally.  Straight leg raise positive bilaterally. Neuro: Grossly normal, moves all extremities Psych: Normal affect and thought content      Gaige Fussner M. [name], MD 12/05/2019 3:58 PM

## 2019-12-06 NOTE — Progress Notes (Signed)
Please inform patient of the following:  Xray shows mild arthritis in her back as we discussed at her OV. Do not need to do anything else at this time. Would like for her to follow up with sports medicine next week.  Algis Greenhouse. [name], MD 12/06/2019 10:36 AM

## 2019-12-10 ENCOUNTER — Other Ambulatory Visit: Payer: Self-pay

## 2019-12-10 ENCOUNTER — Ambulatory Visit: Payer: BC Managed Care – PPO | Admitting: Family Medicine

## 2019-12-10 ENCOUNTER — Encounter: Payer: Self-pay | Admitting: Family Medicine

## 2019-12-10 VITALS — BP 130/88 | HR 90 | Ht 64.0 in | Wt 277.6 lb

## 2019-12-10 DIAGNOSIS — M5416 Radiculopathy, lumbar region: Secondary | ICD-10-CM

## 2019-12-10 MED ORDER — LORAZEPAM 0.5 MG PO TABS
ORAL_TABLET | ORAL | 0 refills | Status: DC
Start: 2019-12-10 — End: 2020-01-08

## 2019-12-10 MED ORDER — PREGABALIN 75 MG PO CAPS: 75.0000 mg | ORAL_CAPSULE | Freq: Two times a day (BID) | ORAL | 3 refills | Status: DC

## 2019-12-10 MED ORDER — PREDNISONE 50 MG PO TABS
50.0000 mg | ORAL_TABLET | Freq: Every day | ORAL | 0 refills | Status: DC
Start: 2019-12-10 — End: 2020-01-08

## 2019-12-10 NOTE — Progress Notes (Signed)
Subjective:    I'm seeing this patient as a consultation for:  Dr. Rogers Blocker and Dr. [name]. Note will be routed back to referring provider/PCP.  CC: Low back pain and R leg pain  I, Molly Weber, LAT, ATC, am serving as scribe for Dr. Lynne Leader.  HPI: Pt is a 39 y/o female presenting w/ c/o chronic LBP and R posterior leg pain that has been worsening over the past 2 weeks.  She has been seen by Dr. Rogers Blocker and [name] over the past 2 weeks and was seen before that in the ED on 11/26/19.  She has been taking IBU and Methocarbamol w/ no relief.  She had a Toradol/Depo 80 IM injection on 12/05/19 at Dr. Marigene Ehlers office.  Pt states that today, she is having pain in her low back and R calf.  She is having numbness in her entire R LE.  She reports cramping/spasming in both her low back and in her R LE, particularly in her R calf.  Radiating pain: yes into the R posterior LE R LE numbness/tingling: Yes R LE weakness: yes  Aggravating factors: housework; prolonged sitting; transitioning from sitting to standing Treatments tried: IBU, Methocarbamol; ice, heat  Diagnostic imaging: L-spine XR- 12/05/19  Past medical history, Surgical history, Family history, Social history, Allergies, and medications have been entered into the medical record, reviewed.   Review of Systems: No new headache, visual changes, nausea, vomiting, diarrhea, constipation, dizziness, abdominal pain, skin rash, fevers, chills, night sweats, weight loss, swollen lymph nodes, body aches, joint swelling, muscle aches, chest pain, shortness of breath, mood changes, visual or auditory hallucinations.   Objective:    Vitals:   12/10/19 1242  BP: 130/88  Pulse: 90  SpO2: 98%   General: Well Developed, well nourished, and in no acute distress.  Neuro/Psych: Alert and oriented x3, extra-ocular muscles intact, able to move all 4 extremities, sensation grossly intact. Skin: Warm and dry, no rashes noted.  Respiratory: Not using accessory  muscles, speaking in full sentences, trachea midline.  Cardiovascular: Pulses palpable, no extremity edema. Abdomen: Does not appear distended. MSK: L-spine: Normal-appearing nontender decreased motion pain with extension. Lower sensation and reflexes are equal normal bilaterally. Lower extremity strength: Hip flexion 4/5 right, 5/5 left. Hip abduction: 5/5 right, 5/5 left. Hip adduction: 5/5 right, 5/5 left Knee extension: 4/5 right, 5/5 left Knee flexion: 5/5 bilaterally. Foot dorsiflexion and great toe dorsiflexion 5/5 bilaterally. Foot plantarflexion 5/5 bilateral  Lab and Radiology Results EXAM: LUMBAR SPINE - COMPLETE 4+ VIEW  COMPARISON:  No recent prior.  FINDINGS: No acute soft tissue bony abnormality identified. No evidence of fracture. Mild degenerative change noted. Pelvic calcifications consistent phleboliths.  IMPRESSION: Mild degenerative change.  No evidence of fracture.   Electronically Signed   By: Marcello Moores  Register   On: 12/06/2019 09:41  I, Lynne Leader, personally (independently) visualized and performed the interpretation of the images attached in this note.   Impression and Recommendations:    Assessment and Plan: 39 y.o. female with lower extremity pain.  Concern for radiculopathy.  Patient has symptoms bilateral of her right is worse than left.  She also has some weakness to hip flexion and knee extension.  This is concerning for multiple level radiculopathy.  At this point failing conservative management and worsening.  Plan for MRI.  Also will treat with prednisone and gabapentin.  Ativan prior to MRI.  Of note patient does have an allergy to prednisone.  She notes as a child she was  given prednisolone and Ceclor at the same time and developed a rash.  She has never had any other rash like that prior.  I think the risk of allergy to steroids is extremely low and prednisone would be safe to try.Marland Kitchen  PDMP not reviewed this encounter. Orders Placed  This Encounter  Procedures  . MR Lumbar Spine Wo Contrast    Standing Status:   Future    Standing Expiration Date:   12/09/2020    Order Specific Question:   What is the patient's sedation requirement?    Answer:   Anti-anxiety    Order Specific Question:   Does the patient have a pacemaker or implanted devices?    Answer:   No    Order Specific Question:   Preferred imaging location?    Answer:   [facility]process development [facility] (table limit-350lbs)    Order Specific Question:   Radiology Contrast Protocol - do NOT remove file path    Answer:   [email or web address]   Meds ordered this encounter  Medications  . LORazepam (ATIVAN) 0.5 MG tablet    Sig: 1-2 tabs 30 - 60 min prior to MRI. Do not drive with this medicine.    Dispense:  4 tablet    Refill:  0  . pregabalin (LYRICA) 75 MG capsule    Sig: Take 1 capsule (75 mg total) by mouth 2 (two) times daily.    Dispense:  60 capsule    Refill:  3  . predniSONE (DELTASONE) 50 MG tablet    Sig: Take 1 tablet (50 mg total) by mouth daily.    Dispense:  5 tablet    Refill:  0    Discussed warning signs or symptoms. Please see discharge instructions. Patient expresses understanding.   The above documentation has been reviewed and is accurate and complete Lynne Leader, M.D.

## 2019-12-10 NOTE — Patient Instructions (Addendum)
Thank you for coming in today. Plan for MRI lumbar spine.  Take lyrica up to 2x daily for nerve pain.  Let me know if you have a problem getting or tolerating this medicine.  The dose can be increased if needed.  Take the prednisone daily for 5 days.  STOP if you get a rash.  It will increase blood sugar or make you jittery temporarily.   Recheck with me following MRI.    Radicular Pain Radicular pain is a type of pain that spreads from your back or neck along a spinal nerve. Spinal nerves are nerves that leave the spinal cord and go to the muscles. Radicular pain is sometimes called radiculopathy, radiculitis, or a pinched nerve. When you have this type of pain, you may also have weakness, numbness, or tingling in the area of your body that is supplied by the nerve. The pain may feel sharp and burning. Depending on which spinal nerve is affected, the pain may occur in the:  Neck area (cervical radicular pain). You may also feel pain, numbness, weakness, or tingling in the arms.  Mid-spine area (thoracic radicular pain). You would feel this pain in the back and chest. This type is rare.  Lower back area (lumbar radicular pain). You would feel this pain as low back pain. You may feel pain, numbness, weakness, or tingling in the buttocks or legs. Sciatica is a type of lumbar radicular pain that shoots down the back of the leg. Radicular pain occurs when one of the spinal nerves becomes irritated or squeezed (compressed). It is often caused by something pushing on a spinal nerve, such as one of the bones of the spine (vertebrae) or one of the round cushions between vertebrae (intervertebral disks). This can result from:  An injury.  Wear and tear or aging of a disk.  The growth of a bone spur that pushes on the nerve. Radicular pain often goes away when you follow instructions from your health care provider for relieving pain at home. Follow these instructions at home: Managing pain       If directed, put ice on the affected area: ? Put ice in a plastic bag. ? Place a towel between your skin and the bag. ? Leave the ice on for 20 minutes, 2-3 times a day.  If directed, apply heat to the affected area as often as told by your health care provider. Use the heat source that your health care provider recommends, such as a moist heat pack or a heating pad. ? Place a towel between your skin and the heat source. ? Leave the heat on for 20-30 minutes. ? Remove the heat if your skin turns bright red. This is especially important if you are unable to feel pain, heat, or cold. You may have a greater risk of getting burned. Activity   Do not sit or rest in bed for long periods of time.  Try to stay as active as possible. Ask your health care provider what type of exercise or activity is best for you.  Avoid activities that make your pain worse, such as bending and lifting.  Do not lift anything that is heavier than 10 lb (4.5 kg), or the limit that you are told, until your health care provider says that it is safe.  Practice using proper technique when lifting items. Proper lifting technique involves bending your knees and rising up.  Do strength and range-of-motion exercises only as told by your health care provider or  physical therapist. General instructions  Take over-the-counter and prescription medicines only as told by your health care provider.  Pay attention to any changes in your symptoms.  Keep all follow-up visits as told by your health care provider. This is important. ? Your health care provider may send you to a physical therapist to help with this pain. Contact a health care provider if:  Your pain and other symptoms get worse.  Your pain medicine is not helping.  Your pain has not improved after a few weeks of home care.  You have a fever. Get help right away if:  You have severe pain, weakness, or numbness.  You have difficulty with bladder or  bowel control. Summary  Radicular pain is a type of pain that spreads from your back or neck along a spinal nerve.  When you have radicular pain, you may also have weakness, numbness, or tingling in the area of your body that is supplied by the nerve.  The pain may feel sharp or burning.  Radicular pain may be treated with ice, heat, medicines, or physical therapy. This information is not intended to replace advice given to you by your health care provider. Make sure you discuss any questions you have with your health care provider. Document Revised: 01/02/2018 Document Reviewed: 01/02/2018 Elsevier Patient Education  [place].

## 2019-12-19 ENCOUNTER — Ambulatory Visit: Payer: BC Managed Care – PPO | Admitting: Physician Assistant

## 2019-12-26 ENCOUNTER — Telehealth: Payer: Self-pay | Admitting: Neurology

## 2019-12-26 NOTE — Telephone Encounter (Signed)
Patient presented to the lobby stating our phones were down. She needs Rx for Xanax for upcoming MRI tomorrow night. [facility]. Best call back [phone]

## 2019-12-26 NOTE — Telephone Encounter (Signed)
Called the patient back. According to the date she was here it appeared a script was printed. The patient states that she did not receive the printed script. I looked on the [place] drug registry and there was no indication of xanax being filled for the patient. Advised the patient that I did see where someone ordered lorazepam 0.5 mg for her MRI and gave 4 tablets. Advised that she could take 1 of those 4 tablet the night of the sleep study. Advised that she shouldn't require more then 1 tablet at the time of the sleep study. Then advised for MRI she would take 1 tablet 30 min prior to the MRI and then additional tablet if needed at the time of the MRI leaving her with additional tablet. Advised that if she finds that she will need additional med she can contact and let us know but would think that this would be sufficient.

## 2019-12-27 ENCOUNTER — Ambulatory Visit (INDEPENDENT_AMBULATORY_CARE_PROVIDER_SITE_OTHER): Payer: BC Managed Care – PPO | Admitting: Neurology

## 2019-12-27 ENCOUNTER — Other Ambulatory Visit: Payer: Self-pay

## 2019-12-27 DIAGNOSIS — Z6841 Body Mass Index (BMI) 40.0 and over, adult: Secondary | ICD-10-CM

## 2019-12-27 DIAGNOSIS — G4710 Hypersomnia, unspecified: Secondary | ICD-10-CM

## 2019-12-27 DIAGNOSIS — G2581 Restless legs syndrome: Secondary | ICD-10-CM

## 2019-12-27 DIAGNOSIS — E282 Polycystic ovarian syndrome: Secondary | ICD-10-CM

## 2019-12-27 DIAGNOSIS — G4719 Other hypersomnia: Secondary | ICD-10-CM

## 2019-12-27 DIAGNOSIS — J4520 Mild intermittent asthma, uncomplicated: Secondary | ICD-10-CM

## 2020-01-04 DIAGNOSIS — G2581 Restless legs syndrome: Secondary | ICD-10-CM | POA: Insufficient documentation

## 2020-01-04 DIAGNOSIS — G4719 Other hypersomnia: Secondary | ICD-10-CM | POA: Insufficient documentation

## 2020-01-04 NOTE — Addendum Note (Signed)
Addended by: Larey Seat on: 01/04/2020 03:17 PM   Modules accepted: Orders

## 2020-01-04 NOTE — Progress Notes (Signed)
Audio and video analysis did not show any abnormal or unusual movements, behaviors, phonations or vocalizations.   Mild Snoring was noted. EKG was in keeping with normal sinus rhythm (NSR).   IMPRESSION:  1. 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 2. Oxygen desaturations were brief and intermittent and strongly REM associated.    RECOMMENDATIONS:  1. Advise auto-titration CPAP device for REM sleep dependent apnea. Setting between 6 and 16 cm water, 2 cm EPR.    2. Obesity treatment is recommended, as a lower BMI will help to reduce the degree of apnea.

## 2020-01-04 NOTE — Procedures (Signed)
PATIENT'S NAME:  Debra Miller, Debra Miller DOB:      May 11, 1981      MR#:    [id number]     DATE OF RECORDING: 12/27/2019 REFERRING M.D.:  [name], MD Study Performed:   Baseline Polysomnogram HISTORY:  Debra Miller is a 39 Year- old White or Caucasian female patient seen here on 11-12-2019 upon a referral on 11/12/2019 from Dr [name]. Chief concern according to patient: "I am just drained, have just no energy - and I am sleepy".  The patient was diagnosed with PCOS. "I didn't have a period for years- but I had 5 pregnancies, and I got pregnant easily, 3 living children. I lost hair, and acne was going crazy, and I gained weight".    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 She reports RLS, but only in the right leg. She has ankle edema.  She had swimmer's ear in 08-2019, ear infection, swelling and pain, Hirsutism.    The patient endorsed the Epworth Sleepiness Scale at 15/24 points.   The patient's weight 280 pounds with a height of 64 (inches), resulting in a BMI of 47.8 kg/m2. The patient's neck circumference measured 21 inches.  CURRENT MEDICATIONS: Ventolin, Voltaren, Glucophage, Singulair, Multivitamin, Aldactone   PROCEDURE:  This is a multichannel digital polysomnogram utilizing the Somnostar 11.2 system.  Electrodes and sensors were applied and monitored per AASM Specifications.   EEG, EOG, Chin and Limb EMG, were sampled at 200 Hz.  ECG, Snore and Nasal Pressure, Thermal Airflow, Respiratory Effort, CPAP Flow and  Pressure, Oximetry was sampled at 50 Hz. Digital video and audio were recorded.      BASELINE STUDY: Lights Out was at 22:03 and Lights On at 05:00.  Total recording time (TRT) was 417.5 minutes, with a total sleep time (TST) of 365 minutes.   The patient's sleep latency was 31 minutes.  REM latency was 40 minutes.  The sleep efficiency was 87.4 %.     SLEEP ARCHITECTURE: WASO (Wake after sleep onset) was 31 minutes.  There were 20 minutes in Stage N1, 207 minutes Stage N2, 72 minutes Stage N3 and 66 minutes in Stage REM.  The percentage of Stage N1 was 5.5%, Stage N2 was 56.7%, Stage N3 was 19.7% and Stage R (REM sleep) was 18.1%.    RESPIRATORY ANALYSIS:  There were a total of 61 respiratory events:  0 obstructive apneas, 0 central apneas and 0 mixed apneas with a total of 0 apneas and an apnea index (AI) of 0 /hour. There were 61 hypopneas with a hypopnea index of 10. /hour. The total APNEA/HYPOPNEA INDEX (AHI) was 10./hour and 50 events occurred in REM sleep and 22 events in NREM. The REM AHI was  45.5 /hour, versus a non-REM AHI of 2.2. The patient spent 227 minutes of total sleep time in the supine position and 138 minutes in non-supine. The supine AHI was 10.6 versus a non-supine AHI of 9.1.  OXYGEN SATURATION & C02:  The Wake baseline 02 saturation was 96%, with the lowest being 84%. Time spent below 89% saturation equaled 3 minutes.  The arousals were noted as: 37 were spontaneous,  0 were associated with PLMs, 13 were associated with respiratory events. The patient had a total of 0 Periodic Limb Movements.    Audio and video analysis did not show any abnormal or unusual movements, behaviors, phonations or vocalizations.   Mild Snoring was noted. EKG was in keeping with normal sinus rhythm (NSR).   IMPRESSION:  1. Mild Obstructive Sleep Apnea(OSA) at AHI of 10/h with strong REM sleep dependence. 2. Oxygen desaturations were brief and intermittent and strongly REM associated.     RECOMMENDATIONS:  1. Advise auto-titration CPAP device for REM sleep dependent apnea. Setting between 6 and 16 cm water, 2 cm EPR.    2. Obesity treatment , as lower BMI will help to reduce the degree of apnea.    I certify that I have reviewed the entire raw data recording prior to the issuance of this report in accordance with the Standards of Accreditation of the American Academy of Sleep Medicine (AASM)    Larey Seat, MD Diplomat, American Board of Psychiatry and Neurology  Diplomat, American Board of Sleep Medicine Market researcher, [facility] [employer]

## 2020-01-07 ENCOUNTER — Encounter: Payer: Self-pay | Admitting: Family Medicine

## 2020-01-07 ENCOUNTER — Other Ambulatory Visit: Payer: Self-pay

## 2020-01-07 ENCOUNTER — Ambulatory Visit: Payer: BC Managed Care – PPO | Admitting: Family Medicine

## 2020-01-07 ENCOUNTER — Telehealth: Payer: Self-pay | Admitting: Family Medicine

## 2020-01-07 VITALS — BP 130/90 | HR 89 | Ht 64.0 in | Wt 281.0 lb

## 2020-01-07 DIAGNOSIS — M5416 Radiculopathy, lumbar region: Secondary | ICD-10-CM

## 2020-01-07 NOTE — Progress Notes (Signed)
Rito Ehrlich, am serving as a Education administrator for Dr. Lynne Leader.  Debra Miller is a 39 y.o. female who presents to [place] at [facility] today for f/u of low back and R posterior leg pain and L-spine MRI review.  She was last seen by Dr. Georgina Snell on 12/10/19 and was having worsening low back and R leg pain as well as numbness in her R LE.  She was prescribed prednisone and Lyrica.  Since her last visit, pt reports prednisone did not help and the lyrica will help take the edge off her back pain but did not touch her leg and arm numbness.   She notes that she has bilateral lower extremity numbness and tingling occurring mostly at the lateral thigh down to the lateral calf and foot bilaterally.  She also has some symptoms into the anterior thigh as well.  Diagnostic testing: L-spine MRI- 01/02/20  ; L-spine XR- 12/05/19  Pertinent review of systems: No fevers or chills  Relevant historical information: CIN-3, obesity, restless leg syndrome   Exam:  BP 130/90 (BP Location: Left Arm, Patient Position: Sitting, Cuff Size: Large)   Pulse 89   Ht 5\' 4"  (1.626 m)   Wt 281 lb (127.5 kg)   SpO2 99%   BMI 48.23 kg/m  General: Well Developed, well nourished, and in no acute distress.   MSK: L-spine nontender normal motion. Extremity strength intact    Lab and Radiology Results  Interface, Rad Results In - 01/03/2020 8:58 AM EDT  Formatting of this note might be different from the original.  MRI LUMBAR SPINE WITHOUT CONTRAST, 01/02/2020 10:13 PM   INDICATION: \ M54.16 Lumbar radiculopathy   COMPARISON: None.   TECHNIQUE: Multiplanar, multisequence surface-coil magnetic resonance imaging of the lumbar spine was performed without contrast.   LEVELS IMAGED: Lower thoracic to the upper sacral region.   FINDINGS:    Alignment: No substantial subluxation.   Vertebrae: Vertebral body heights are maintained.  No marrow signal abnormalities to suggest neoplasm.    Conus medullaris: In normal position terminating at L1. Normal signal and contour.   Degenerative changes:   T12-L1: No substantial canal or foraminal stenosis.  L1-L2: No substantial canal or foraminal stenosis.  L2-L3: No substantial canal or foraminal stenosis.  L3-L4: No substantial canal or foraminal stenosis.  L4-L5: Disc height loss and disc desiccation. Facet hypertrophy is mild-to-moderategreater on the left. Ligamentum flavum thickening present. No substantial canal narrowing. Mild left foraminal narrowing. Mild crowding of the left subarticular recess.  L5-S1: Broad-based disc bulge slightly eccentric to the left but no discrete disc herniation. With moderate facet hypertrophy there is mild crowding of the left subarticular recess and left foramen. No substantial canal or right foraminal narrowing. Mild disc height loss and disc desiccation with an annular fissure along the posterior margin of the disc.   Upper Sacrum: No focal lesion identified.   Additional comments: None.   CONCLUSION:    1. No high grade canal or foraminal stenosis within the lumbar spine.   2. Mild degenerative disc disease at L4-5 and L5-S1 with an annular fissure at L5-S1. Facet hypertrophy present at these levels greater on the left which contributes to mild narrowing of the left subarticular recesses and left neural foramina. Specimen Collected: 01/03/20 8:49 AM Last Resulted: 01/03/20 8:56 AM  Received From: [facility]  Result Received: 01/04/20 3:14 PM    I, Lynne Leader, personally (independently) visualized and performed the interpretation of the  images attached in this note.  Patient brought a CD of images with her.    Assessment and Plan: 39 y.o. female with bilateral lower extremity paresthesias and pain.  Patient does have some changes on MRI concerning for radiculopathy especially at L5-S1.  It is possible that her symptoms are originating elsewhere.   Could be peripheral neuropathy or even central nervous system issue like MS.  However these are less likely.  Plan for trial of epidural steroid injection and continued Lyrica.  If not improved neck step is probably nerve conduction study followed by central nervous system MRI.   PDMP not reviewed this encounter. Orders Placed This Encounter  Procedures  . DG INJECT DIAG/THERA/INC NEEDLE/CATH/PLC EPI/LUMB/SAC W/IMG    Standing Status:   Future    Standing Expiration Date:   01/06/2021    Order Specific Question:   Reason for Exam (SYMPTOM  OR DIAGNOSIS REQUIRED)    Answer:   interlaminal L5-S1    Order Specific Question:   Is the patient pregnant?    Answer:   No    Order Specific Question:   Preferred Imaging Location?    Answer:   [id number]    Order Specific Question:   Radiology Contrast Protocol - do NOT remove file path    Answer:   [email or web address]   No orders of the defined types were placed in this encounter.    Discussed warning signs or symptoms. Please see discharge instructions. Patient expresses understanding.   The above documentation has been reviewed and is accurate and complete Lynne Leader, M.D.  Total encounter time 20 minutes including charting time date of service. Reviewed MRI with patient and discussed plan and next steps

## 2020-01-07 NOTE — Telephone Encounter (Signed)
Received MRI from [facility]. Possible pinched nerve at L4-5 and L5-S1 likely contributes to leg pain.  Please schedule follow-up with me in person if possible to review the MRI results further.  If they provided you with a CD of images please bring those with you to the visit.  Image documentation will be sent to scan.

## 2020-01-07 NOTE — Telephone Encounter (Signed)
Patient has a MRI follow up today at 2:15

## 2020-01-07 NOTE — Patient Instructions (Signed)
Thank you for coming in today.  Plan for back injection.  Call [facility] at [phone] to schedule.  Let me know how you feel after the injection.  Next step is probably a nerve conduction study.   Could even do MRI of the brain, cspine and tspine to rule out MS.

## 2020-01-08 ENCOUNTER — Telehealth: Payer: Self-pay | Admitting: Neurology

## 2020-01-08 ENCOUNTER — Encounter: Payer: Self-pay | Admitting: Physician Assistant

## 2020-01-08 ENCOUNTER — Ambulatory Visit: Payer: BC Managed Care – PPO | Admitting: Physician Assistant

## 2020-01-08 DIAGNOSIS — L700 Acne vulgaris: Secondary | ICD-10-CM

## 2020-01-08 DIAGNOSIS — D2239 Melanocytic nevi of other parts of face: Secondary | ICD-10-CM

## 2020-01-08 DIAGNOSIS — L738 Other specified follicular disorders: Secondary | ICD-10-CM | POA: Diagnosis not present

## 2020-01-08 DIAGNOSIS — D1724 Benign lipomatous neoplasm of skin and subcutaneous tissue of left leg: Secondary | ICD-10-CM

## 2020-01-08 DIAGNOSIS — L918 Other hypertrophic disorders of the skin: Secondary | ICD-10-CM

## 2020-01-08 DIAGNOSIS — B351 Tinea unguium: Secondary | ICD-10-CM

## 2020-01-08 DIAGNOSIS — D1721 Benign lipomatous neoplasm of skin and subcutaneous tissue of right arm: Secondary | ICD-10-CM

## 2020-01-08 DIAGNOSIS — D225 Melanocytic nevi of trunk: Secondary | ICD-10-CM

## 2020-01-08 DIAGNOSIS — L83 Acanthosis nigricans: Secondary | ICD-10-CM

## 2020-01-08 DIAGNOSIS — L2089 Other atopic dermatitis: Secondary | ICD-10-CM

## 2020-01-08 DIAGNOSIS — D229 Melanocytic nevi, unspecified: Secondary | ICD-10-CM

## 2020-01-08 DIAGNOSIS — D1720 Benign lipomatous neoplasm of skin and subcutaneous tissue of unspecified limb: Secondary | ICD-10-CM

## 2020-01-08 DIAGNOSIS — L821 Other seborrheic keratosis: Secondary | ICD-10-CM

## 2020-01-08 DIAGNOSIS — B353 Tinea pedis: Secondary | ICD-10-CM

## 2020-01-08 MED ORDER — CICLOPIROX OLAMINE 0.77 % EX CREA: TOPICAL_CREAM | Freq: Two times a day (BID) | CUTANEOUS | 0 refills | Status: DC

## 2020-01-08 NOTE — Telephone Encounter (Signed)
Pt returned call.  I advised pt that Dr. Brett Fairy reviewed their sleep study results and found that pt has mild OSA. Dr. Brett Fairy recommends that pt starts auto CPAP. I reviewed PAP compliance expectations with the pt. Pt is agreeable to starting a CPAP. I advised pt that an order will be sent to a DME, [facility] ([facility]), and [facility] ([facility]) will call the pt within about one week after they file with the pt's insurance. [facility] will show the pt how to use the machine, fit for masks, and troubleshoot the CPAP if needed. A follow up appt was made for insurance purposes with Ward Givens, NP on Sept 9,2021 at 10 am. Pt verbalized understanding to arrive 15 minutes early and bring their CPAP. A letter with all of this information in it will be mailed to the pt as a reminder. I verified with the pt that the address we have on file is correct. Pt verbalized understanding of results. Pt had no questions at this time but was encouraged to call back if questions arise. I have sent the order to [facility] and have received confirmation that they have received the order.

## 2020-01-08 NOTE — Telephone Encounter (Signed)
-----   Message from Larey Seat, MD sent at 01/04/2020  3:17 PM EDT ----- Advise auto-titration CPAP device for REM sleep dependent apnea. Setting between 6 and 16 cm water, 2 cm EPR.    2. Obesity treatment is recommended, as a lower BMI will help to reduce the degree of apnea.

## 2020-01-08 NOTE — Progress Notes (Signed)
New Patient Visit  Subjective  Debra Miller is a 39 y.o. female who presents for the following: Skin Problem (moles and blackheads), Eczema, and Acne. Recent PCOS diagnosis with GYN. Mole on face that keeps getting bigger mole on back that hurts at times. Lots of skin tags around neck. Large mole under left arm removed and was benign. Eczema and dry skin she uses cetaphil and it doesn't always seem to work. Acne has gotten better since they started her on metformin and spironolactone. She also has a discoloration on her neck and underarms, between legs. She also gets boils in underarms and in between legs. She has been on the spiro for 4-5 months at a low dose. Her right and left great toenail yellows as it gets longer. She also has a bump on the left tip of her nose and her upper forehead. They are sometimes sore but dont go away. On her feet she builds callus on her heels and has to use a special moisturizer that maintains it. She also gets a lump on her shin and right forearm. On her shin it felt like a bruise and it burns and aches.    Objective  Well appearing patient in no apparent distress; mood and affect are within normal limits.  A focused examination was performed including face, back, neck, chest, axillae, lower legs, forearms, feet, toenails.. Relevant physical exam findings are noted in the Assessment and Plan. No suspicious moles noted on back.  Objective  Right Forehead: Stuck-on, waxy papules and plaques.   Objective  Left Temple, Left Tip of Nose, Right Temple: Small yellow papules with a central dell.   Objective  Mid Back, Right Zygomatic Area: Tan-brown symmetric macules and papules.   Objective  Left Anterior Neck, Right Anterior Neck: Fleshy, skin-colored sessile and pedunculated papules.    Objective  Mid Back: Erythematous papules and pustules with comedones. Few inflamed papules back.   Objective  Neck - Posterior: Slight thickening and  hyperpigmentation of neck and axillae.   Objective  Left Foot - Posterior: Slight scale noted feet with slight yellowing of toenails.  Objective  Left Forearm - Posterior, Right Forearm - Posterior: No eczema present today.  Objective  Left Hallux Toe Nail Plate, Right Hallux Toe Nail Plate: Yellowing of both great toenails. Slight lifting of the right great nail.  Objective  Right Forearm - Posterior: Soft deep nodule palpated right forearm. Not tender to touch.   Objective  Left Lower Leg - Anterior: Soft deep nodule palpated. Extremely tender to touch. Patient guarding and attempting to pull away even with the lightest touch.   Assessment & Plan  Seborrheic keratosis Right Forehead  Discussed option to freeze. Pt does not want if lesion appears benign.  Sebaceous hyperplasia (3) Left Temple; Right Temple; Left Tip of Nose  Discussed removal options and high rate of recurrence.  Nevus (2) Mid Back; Right Zygomatic Area  Skin tag (2) Left Anterior Neck; Right Anterior Neck  No treatment.  Acne vulgaris Mid Back  Continue Spironolactone. It appears to helping to clear the face and the back of acne.   Acanthosis nigricans Neck - Posterior  She is to continue to work on weight loss and continue on Spironolactone for now.   Tinea pedis of left foot Left Foot - Posterior  Ordered Medications: ciclopirox (LOPROX) 0.77 % cream  Tinea pedis of right foot Right Foot - Posterior  Ordered Medications: ciclopirox (LOPROX) 0.77 % cream  Other atopic dermatitis (2)  Left Forearm - Posterior; Right Forearm - Posterior  Discussed ceramide based moisturizers and bodywashes. Samples given.  Onychomycosis (2) Left Hallux Toe Nail Plate; Right Hallux Toe Nail Plate  Nail clipping for PAS stain.   Lipoma of upper extremity, unspecified laterality Right Forearm - Posterior  Lipoma of lower extremity, unspecified laterality Left Lower Leg - Anterior  Next best  step may be to schedule an ultrasound.

## 2020-01-08 NOTE — Telephone Encounter (Signed)
Called patient to discuss sleep study results. No answer at this time. LVM for the patient to call back.  Will send a mychart message as well. 

## 2020-01-10 ENCOUNTER — Telehealth: Payer: Self-pay | Admitting: *Deleted

## 2020-01-10 MED ORDER — NUVAIL EX SOLN: 1.0000 "application " | Freq: Every day | CUTANEOUS | 2 refills | Status: DC

## 2020-01-10 NOTE — Telephone Encounter (Signed)
Left message for patient to call us back regarding her pathology report.

## 2020-01-10 NOTE — Telephone Encounter (Signed)
Path to patient. Will send in prescription for nuvail ok per Anderson Malta to see it that helps patients nail.

## 2020-01-10 NOTE — Telephone Encounter (Signed)
-----   Message from Arlyss Gandy, [place] sent at 01/09/2020  9:22 PM EDT ----- PAS was negative for fungus. Option to do another clipping to send for fungal culture. Also option to try nuvail by rx or dermanail OTC to see if color will improve.

## 2020-01-13 ENCOUNTER — Ambulatory Visit
Admission: RE | Admit: 2020-01-13 | Discharge: 2020-01-13 | Disposition: A | Discharge status: Home / Self Care | Payer: BC Managed Care – PPO | Source: Ambulatory Visit | Attending: Family Medicine | Admitting: Family Medicine

## 2020-01-13 ENCOUNTER — Other Ambulatory Visit: Payer: Self-pay

## 2020-01-13 ENCOUNTER — Telehealth: Payer: Self-pay

## 2020-01-13 DIAGNOSIS — M5416 Radiculopathy, lumbar region: Secondary | ICD-10-CM

## 2020-01-13 MED ORDER — METHYLPREDNISOLONE ACETATE 40 MG/ML INJ SUSP (RADIOLOG
120.0000 mg | Freq: Once | INTRAMUSCULAR | Status: AC
  Administered 2020-01-13: 120 mg via EPIDURAL

## 2020-01-13 MED ORDER — IOPAMIDOL (ISOVUE-M 200) INJECTION 41%
1.0000 mL | Freq: Once | INTRAMUSCULAR | Status: AC
  Administered 2020-01-13: 1 mL via EPIDURAL

## 2020-01-13 NOTE — Discharge Instructions (Signed)

## 2020-01-13 NOTE — Telephone Encounter (Signed)
Fax received from [employer] for prior authorization for Nuvail Solution to be done through [employer].

## 2020-01-13 NOTE — Telephone Encounter (Signed)
Prior authorization done for Nuvail Solution through [employer].    Your information has been submitted to [facility]. To check for an updated outcome later, reopen this PA request from your dashboard.  If [employer] has not responded to your request within 24 hours, contact [place] at [phone]. If you think there may be a problem with your PA request, use our live chat feature at the bottom right.

## 2020-01-14 NOTE — Telephone Encounter (Signed)
Phone call to patient to give her Debra Miller's recommendation.  Voicemail left for patient to give the office a call back.

## 2020-01-14 NOTE — Telephone Encounter (Signed)
-----   Message from Arlyss Gandy, [place] sent at 01/14/2020 12:39 PM EDT ----- ST suggested she see plastics or general surgeon for nodule on her left shin. Chad Cordial may be best.

## 2020-01-15 NOTE — Telephone Encounter (Signed)
There is nothing else comparable to nuvail. I would suggest doing nothing right now. Letting the nail grow out again, letting us get another clipping, can be NTS, and do fungal culture.

## 2020-01-15 NOTE — Telephone Encounter (Signed)
Phone call to patient to inform her that the prescription for Nuvail Solution was denied by her insurance company and to also give patient Debra Malta Clark-Bruning,PA-C's recommendations.  Voicemail left for patient to give the office a call back.

## 2020-01-15 NOTE — Telephone Encounter (Signed)
Patient left message on office voice mail that she was returning our call. 

## 2020-01-15 NOTE — Telephone Encounter (Signed)
Phone call from patient returning our call.  I informed patient that the prescription Nuvail was denied by her insurance and she would have to pay cash pay for the prescription.  Patient wanted to know if there's anything else Anderson Malta Clark-Bruning,PA-C could precribe?  I informed patient I would get a message to [facility], PA-C and get back with her.  I also gave patient Anderson Malta Clark-Bruning's recommendations about who to see for her left shin.  Patient aware, and given Dr. Forde Radon phone number.

## 2020-01-15 NOTE — Telephone Encounter (Signed)
Prior authorization for Nuvail Solution denied by patient's insurance.   This request has received a denial. View the bottom of the request for an electronic copy of the denial letter with a list of denial reasons from the plan.  After reading the electronic denial letter, you may choose to complete an appeal. View the bottom of the request to see if an eAppeal is available.

## 2020-01-16 ENCOUNTER — Other Ambulatory Visit: Payer: Self-pay | Admitting: Family Medicine

## 2020-01-16 NOTE — Telephone Encounter (Signed)
Phone call to to patient with Anderson Malta Clark-Bruning,PA-C's recommendations.  Patient aware, patient states that she will give the office a call once the nail grows out.

## 2020-01-21 ENCOUNTER — Ambulatory Visit: Payer: BC Managed Care – PPO | Admitting: Family Medicine

## 2020-01-21 ENCOUNTER — Encounter: Payer: Self-pay | Admitting: Family Medicine

## 2020-01-21 ENCOUNTER — Other Ambulatory Visit: Payer: Self-pay

## 2020-01-21 VITALS — BP 146/91 | HR 65 | Temp 98.1°F | Ht 64.0 in | Wt 275.2 lb

## 2020-01-21 DIAGNOSIS — R439 Unspecified disturbances of smell and taste: Secondary | ICD-10-CM | POA: Insufficient documentation

## 2020-01-21 DIAGNOSIS — G4733 Obstructive sleep apnea (adult) (pediatric): Secondary | ICD-10-CM

## 2020-01-21 DIAGNOSIS — I10 Essential (primary) hypertension: Secondary | ICD-10-CM | POA: Diagnosis not present

## 2020-01-21 DIAGNOSIS — J45909 Unspecified asthma, uncomplicated: Secondary | ICD-10-CM

## 2020-01-21 DIAGNOSIS — E282 Polycystic ovarian syndrome: Secondary | ICD-10-CM | POA: Diagnosis not present

## 2020-01-21 DIAGNOSIS — Z6841 Body Mass Index (BMI) 40.0 and over, adult: Secondary | ICD-10-CM

## 2020-01-21 DIAGNOSIS — M5416 Radiculopathy, lumbar region: Secondary | ICD-10-CM

## 2020-01-21 MED ORDER — PREGABALIN 150 MG PO CAPS: 150.0000 mg | ORAL_CAPSULE | Freq: Two times a day (BID) | ORAL | 3 refills | Status: DC

## 2020-01-21 MED ORDER — SPIRONOLACTONE 100 MG PO TABS
200.0000 mg | ORAL_TABLET | Freq: Every day | ORAL | 3 refills | Status: DC
Start: 2020-01-21 — End: 2020-12-24

## 2020-01-21 NOTE — Progress Notes (Signed)
Debra Miller is a 39 y.o. female who presents today for an office visit.  Assessment/Plan:  Chronic Problems Addressed Today: Dysosmia Unclear etiology.  Would not expect this from any of her current medications.  She will try stopping Metformin for a few days to see if this helps.  She will send a MyChart message to let me know.  OSA (obstructive sleep apnea) Has not yet received CPAP machine.  Hopefully she will get this soon.  Anticipate she will have some improvement in her symptoms once her OSA is treated.  Essential hypertension Above goal though better.  We will increase spironolactone to 200 mg daily.  Hopefully should help some with her acne as well.  PCOS (polycystic ovarian syndrome) She had a regular menstrual cycle for the first time in several years within the last few weeks.  She was very encouraged by this.  We will continue spironolactone 200 mg daily.  Continue Metformin as well though may need to decrease dose to 750 mg daily if she is having significant side effects of 1500 mg daily.  Asthma Stable.  Continue Singulair 10 mg daily.   Class 3 severe obesity without serious comorbidity with body mass index (BMI) of 45.0 to 49.9 in adult [facility]) She is down about 12 pounds from her max weight.  275 pounds today.  We will continue Metformin.  Will consider addition of semaglutide at some point in the future but will address more pressing concerns above first.  Lumbar radiculopathy Continue management per sports medicine.  We will increase Lyrica to 150 mg twice daily.  She will follow-up with him soon.  Will likely need nerve conduction study and possibly advanced imaging per their previous recommendations.     Subjective:  HPI:  Patient here for 100-month follow-up visit.  Since our last visit she has been seen by sports medicine, sleep medicine, and dermatology.  She underwent epidural steroid injection about 2 weeks ago and has been on Lyrica for lumbar  radiculopathy. Symptoms have not improved since her injection. She feels like the lyrica has helped modestly initially but has not done much over the last few weeks.  Still has persistent pain in low back and lower legs.  Pain is much worse with exertion.  Pain is limiting her ability to perform activities of daily living and her housework.  She was found to have mild OSA on her sleep study.  CPAP was ordered but she has not yet received it.  She has also had a visit with dermatology and so our last visit.  They suggest that she decrease her spironolactone.  She has been tolerating this well without side effects.  She has also had issues with everything tasting and smelling the same for the past 3 months or so.  She thinks it started after we increased her dose of Metformin to 1500 mg daily.  No recent illnesses.  No fevers or chills.  No other obvious precipitating events.       Objective:  Physical Exam: BP (!) 146/91   Pulse 65   Temp 98.1 F (36.7 C)   Ht 5\' 4"  (1.626 m)   Wt 275 lb 3.2 oz (124.8 kg)   LMP 01/11/2020   SpO2 98%   BMI 47.24 kg/m   Wt Readings from Last 3 Encounters:  01/21/20 275 lb 3.2 oz (124.8 kg)  01/07/20 281 lb (127.5 kg)  12/10/19 277 lb 9.6 oz (125.9 kg)  Gen: No acute distress, resting comfortably Neuro:  Grossly normal, moves all extremities Psych: Normal affect and thought content  Time Spent: 44 minutes of total time was spent on the date of the encounter performing the following actions: chart review prior to seeing the patient, obtaining history, performing a medically necessary exam, counseling on the treatment plan, placing orders, and documenting in our EHR.        Algis Greenhouse. [name], MD 01/21/2020 10:44 AM

## 2020-01-21 NOTE — Assessment & Plan Note (Signed)
She had a regular menstrual cycle for the first time in several years within the last few weeks.  She was very encouraged by this.  We will continue spironolactone 200 mg daily.  Continue Metformin as well though may need to decrease dose to 750 mg daily if she is having significant side effects of 1500 mg daily.

## 2020-01-21 NOTE — Patient Instructions (Signed)
It was very nice to see you today!  We will increase your spironolactone to 200 mg daily.  You can double up what you have now.  We will also increase your Lyrica.  Please take 75 mg in the morning and 150 mg in the evening for a few days.  After this he can take 150 mg twice daily.  Try stopping the Metformin and let me know if this improves your sense of smell over the next couple of days.  You can send a MyChart message.  Please call about getting your CPAP machine.  Please also schedule appointment with Dr. Georgina Snell soon to discuss neck steps in evaluating your back and leg pain.  I will see you back in 3-6 months. Please come back to see me sooner if needed.   Take care, Dr [name]  Please try these tips to maintain a healthy lifestyle:   Eat at least 3 REAL meals and 1-2 snacks per day.  Aim for no more than 5 hours between eating.  If you eat breakfast, please do so within one hour of getting up.    Each meal should contain half fruits/vegetables, one quarter protein, and one quarter carbs (no bigger than a computer mouse)   Cut down on sweet beverages. This includes juice, soda, and sweet tea.     Drink at least 1 glass of water with each meal and aim for at least 8 glasses per day   Exercise at least 150 minutes every week.

## 2020-01-21 NOTE — Assessment & Plan Note (Signed)
Has not yet received CPAP machine.  Hopefully she will get this soon.  Anticipate she will have some improvement in her symptoms once her OSA is treated.

## 2020-01-21 NOTE — Assessment & Plan Note (Signed)
Above goal though better.  We will increase spironolactone to 200 mg daily.  Hopefully should help some with her acne as well.

## 2020-01-21 NOTE — Assessment & Plan Note (Signed)
Continue management per sports medicine.  We will increase Lyrica to 150 mg twice daily.  She will follow-up with him soon.  Will likely need nerve conduction study and possibly advanced imaging per their previous recommendations.

## 2020-01-21 NOTE — Assessment & Plan Note (Signed)
Stable.  Continue Singulair 10mg daily

## 2020-01-21 NOTE — Assessment & Plan Note (Signed)
Unclear etiology.  Would not expect this from any of her current medications.  She will try stopping Metformin for a few days to see if this helps.  She will send a MyChart message to let me know.

## 2020-01-21 NOTE — Assessment & Plan Note (Signed)
She is down about 12 pounds from her max weight.  275 pounds today.  We will continue Metformin.  Will consider addition of semaglutide at some point in the future but will address more pressing concerns above first.

## 2020-01-22 ENCOUNTER — Encounter: Payer: Self-pay | Admitting: Family Medicine

## 2020-01-22 ENCOUNTER — Ambulatory Visit (INDEPENDENT_AMBULATORY_CARE_PROVIDER_SITE_OTHER): Payer: BC Managed Care – PPO | Admitting: Family Medicine

## 2020-01-22 VITALS — BP 126/80 | HR 77 | Ht 64.0 in | Wt 278.2 lb

## 2020-01-22 DIAGNOSIS — M545 Low back pain, unspecified: Secondary | ICD-10-CM

## 2020-01-22 DIAGNOSIS — R202 Paresthesia of skin: Secondary | ICD-10-CM

## 2020-01-22 DIAGNOSIS — M5416 Radiculopathy, lumbar region: Secondary | ICD-10-CM | POA: Diagnosis not present

## 2020-01-22 MED ORDER — GABAPENTIN 300 MG PO CAPS: 300.0000 mg | ORAL_CAPSULE | Freq: Three times a day (TID) | ORAL | 3 refills | Status: DC | PRN

## 2020-01-22 NOTE — Patient Instructions (Signed)
Thank you for coming in today. Plan for PT.  STOP lyrica.  Start gabapentin up to 3x daily.  Plan for EMG/Nerve study with neurology.  Recheck with me about 1 week after the nerve study is done.

## 2020-01-22 NOTE — Progress Notes (Signed)
I, Wendy Poet, LAT, ATC, am serving as scribe for Dr. Lynne Leader.  Debra Miller is a 39 y.o. female who presents to [place] at [facility] today for f/u of low back and B LE pain and numbness/tingling.  She was last seen by Dr. Georgina Snell on 01/07/20 for L-spine MRI review.  She has tried prednisone and takes Lyrica which seems to help slightly w/ her back pain but does not change or help her LE pain.  She was referred for a lumbar epidural and had a R L5-S1 epidural on 01/13/20.  Since her last visit, pt reports that she has had no change in her back pain or B LE pain since her epidural.  She reports having had HA, nausea and red patches on her arms and face since her epidural.  She con't to have numbness/tingling in her B arms and legs.  Diagnostic testing: L-spine MRI- 01/02/20;  L-spine XR- 12/05/19  Pertinent review of systems: No fevers or chills  Relevant historical information: Migraines and hypertension.  PCOS.   Exam:  BP 126/80 (BP Location: Right Arm, Patient Position: Sitting, Cuff Size: Large)   Pulse 77   Ht 5\' 4"  (1.626 m)   Wt 278 lb 3.2 oz (126.2 kg)   LMP 01/11/2020   SpO2 98%   BMI 47.75 kg/m  General: Well Developed, well nourished, and in no acute distress.   MSK: L-spine normal motion. Skin slight macular erythematous area right forearm.  Nontender.    Lab and Radiology Results  Interface, Rad Results In - 01/03/2020 8:58 AM EDT  Formatting of this note might be different from the original.  MRI LUMBAR SPINE WITHOUT CONTRAST, 01/02/2020 10:13 PM   INDICATION: \ M54.16 Lumbar radiculopathy   COMPARISON: None.   TECHNIQUE: Multiplanar, multisequence surface-coil magnetic resonance imaging of the lumbar spine was performed without contrast.   LEVELS IMAGED: Lower thoracic to the upper sacral region.   FINDINGS:    Alignment: No substantial subluxation.   Vertebrae: Vertebral body heights are maintained.  No marrow signal  abnormalities to suggest neoplasm.   Conus medullaris: In normal position terminating at L1. Normal signal and contour.   Degenerative changes:   T12-L1: No substantial canal or foraminal stenosis.  L1-L2: No substantial canal or foraminal stenosis.  L2-L3: No substantial canal or foraminal stenosis.  L3-L4: No substantial canal or foraminal stenosis.  L4-L5: Disc height loss and disc desiccation. Facet hypertrophy is mild-to-moderategreater on the left. Ligamentum flavum thickening present. No substantial canal narrowing. Mild left foraminal narrowing. Mild crowding of the left subarticular recess.  L5-S1: Broad-based disc bulge slightly eccentric to the left but no discrete disc herniation. With moderate facet hypertrophy there is mild crowding of the left subarticular recess and left foramen. No substantial canal or right foraminal narrowing. Mild disc height loss and disc desiccation with an annular fissure along the posterior margin of the disc.   Upper Sacrum: No focal lesion identified.   Additional comments: None.   CONCLUSION:    1. No high grade canal or foraminal stenosis within the lumbar spine.   2. Mild degenerative disc disease at L4-5 and L5-S1 with an annular fissure at L5-S1. Facet hypertrophy present at these levels greater on the left which contributes to mild narrowing of the left subarticular recesses and left neural foramina. Specimen Collected: 01/03/20 8:49 AM Last Resulted: 01/03/20 8:56 AM  Received From: [facility]  Re       Assessment  and Plan: 39 y.o. female with back pain paresthesias upper and lower extremities.  Lumbar spine MRI was not very helpful and patient did not have any benefit from epidural steroid injection.  Etiology remains somewhat unclear.  Discussed options plan for EMG and physical therapy trial.  We will switch from Lyrica to gabapentin.  Recheck after EMG.   PDMP not reviewed this  encounter. Orders Placed This Encounter  Procedures  . Ambulatory referral to Physical Therapy    Referral Priority:   Routine    Referral Type:   Physical Medicine    Referral Reason:   Specialty Services Required    Requested Specialty:   Physical Therapy  . Ambulatory referral to Neurology    Referral Priority:   Routine    Referral Type:   Consultation    Referral Reason:   Specialty Services Required    Requested Specialty:   Neurology    Number of Visits Requested:   1  . NCV with EMG(electromyography)    Standing Status:   Future    Standing Expiration Date:   01/21/2021    Order Specific Question:   Where should this test be performed?    Answer:   [facility]   Meds ordered this encounter  Medications  . gabapentin (NEURONTIN) 300 MG capsule    Sig: Take 1-2 capsules (300-600 mg total) by mouth 3 (three) times daily as needed (nerve pain).    Dispense:  180 capsule    Refill:  3     Discussed warning signs or symptoms. Please see discharge instructions. Patient expresses understanding.   The above documentation has been reviewed and is accurate and complete Lynne Leader, M.D.

## 2020-02-04 ENCOUNTER — Ambulatory Visit (INDEPENDENT_AMBULATORY_CARE_PROVIDER_SITE_OTHER): Payer: BC Managed Care – PPO | Admitting: Neurology

## 2020-02-04 ENCOUNTER — Ambulatory Visit: Payer: BC Managed Care – PPO | Admitting: Neurology

## 2020-02-04 ENCOUNTER — Other Ambulatory Visit: Payer: Self-pay

## 2020-02-04 ENCOUNTER — Encounter: Payer: Self-pay | Admitting: Neurology

## 2020-02-04 DIAGNOSIS — M5416 Radiculopathy, lumbar region: Secondary | ICD-10-CM

## 2020-02-04 NOTE — Procedures (Signed)
     HISTORY:  Debra Miller is a 39 year old patient with a 20-year history of low back pain.  Within the last 7 or 8 months, she has developed neck and shoulder pain and numbness of the arms and legs that has been persistent with pain that has increased on both legs, right greater than left.  She is being evaluated for the above symptoms.  MRI of the lumbar spine has been unrevealing as to the source of the pain and numbness.  NERVE CONDUCTION STUDIES:  Nerve conduction studies were performed on both lower extremities. The distal motor latencies and motor amplitudes for the peroneal and posterior tibial nerves were within normal limits. The nerve conduction velocities for these nerves were also normal. The sensory latencies for the peroneal and sural nerves were within normal limits. The F wave latencies for the posterior tibial nerves were within normal limits.   EMG STUDIES:  EMG study was performed on the right lower extremity:  The tibialis anterior muscle reveals 2 to 4K motor units with full recruitment. No fibrillations or positive waves were seen. The peroneus tertius muscle reveals 2 to 4K motor units with full recruitment. No fibrillations or positive waves were seen. The medial gastrocnemius muscle reveals 1 to 3K motor units with full recruitment. No fibrillations or positive waves were seen. The vastus lateralis muscle reveals 2 to 4K motor units with full recruitment. No fibrillations or positive waves were seen. The iliopsoas muscle reveals 2 to 4K motor units with full recruitment. No fibrillations or positive waves were seen. The biceps femoris muscle (long head) reveals 2 to 4K motor units with full recruitment. No fibrillations or positive waves were seen. The lumbosacral paraspinal muscles were tested at 3 levels, and revealed no abnormalities of insertional activity at all 3 levels tested. There was good relaxation.   IMPRESSION:  Nerve conduction studies done on lower  extremities were within normal limits.  No evidence of a neuropathy was seen.  EMG of the right lower extremity was unremarkable, there is no evidence of an overlying lumbosacral radiculopathy.  Jill Alexanders MD 02/04/2020 2:21 PM  [facility] [address] [place], [place] [address]  Phone [phone] Fax [phone]

## 2020-02-04 NOTE — Progress Notes (Signed)
Please refer to EMG and nerve conduction procedure note.  

## 2020-02-05 NOTE — Progress Notes (Addendum)
Nerve conduction study is normal.  I am happy to have a follow-up visit or conversation the near future to discuss next steps.   [place]    Nerve / Sites Muscle Latency Ref. Amplitude Ref. Rel Amp Segments Distance Velocity Ref. Area    ms ms mV mV %  cm m/s m/s mVms  R Peroneal - EDB     Ankle EDB 3.5 ?6.5 5.7 ?2.0 100 Ankle - EDB 9   20.3     Fib head EDB 8.8  8.1  142 Fib head - Ankle 26 50 ?44 24.6     Pop fossa EDB 10.4  7.6  94.2 Pop fossa - Fib head 10 59 ?44 23.7     Acc Peron EDB 6.3  0.3  4.02 Pop fossa - Ankle    0.6         Acc Peron - Pop fossa      L Peroneal - EDB     Ankle EDB 3.7 ?6.5 8.3 ?2.0 100 Ankle - EDB 9   28.7     Fib head EDB 8.5  7.5  90.6 Fib head - Ankle 25 52 ?44 31.2     Pop fossa EDB 10.5  7.4  98.7 Pop fossa - Fib head 10 51 ?44 26.5         Pop fossa - Ankle      R Tibial - AH     Ankle AH 3.7 ?5.8 11.5 ?4.0 100 Ankle - AH 9   33.6     Pop fossa AH 11.7  6.4  56 Pop fossa - Ankle 35 44 ?41 23.5  L Tibial - AH     Ankle AH 3.4 ?5.8 15.4 ?4.0 100 Ankle - AH 9   34.6     Pop fossa AH 11.7  11.4  74.3 Pop fossa - Ankle 36 44 ?41 27.6             SNC    Nerve / Sites Rec. Site Peak Lat Ref.  Amp Ref. Segments Distance    ms ms V V  cm  R Sural - Ankle (Calf)     Calf Ankle 3.4 ?4.4 17 ?6 Calf - Ankle 14  L Sural - Ankle (Calf)     Calf Ankle 3.7 ?4.4 16 ?6 Calf - Ankle 14  R Superficial peroneal - Ankle     Lat leg Ankle 3.6 ?4.4 8 ?6 Lat leg - Ankle 14  L Superficial peroneal - Ankle     Lat leg Ankle 3.7 ?4.4 9 ?6 Lat leg - Ankle 14             F  Wave    Nerve F Lat Ref.   ms ms  R Tibial - AH 44.7 ?56.0  L Tibial - AH 44.4 ?56.0

## 2020-02-06 ENCOUNTER — Encounter: Payer: Self-pay | Admitting: Family Medicine

## 2020-02-06 ENCOUNTER — Other Ambulatory Visit: Payer: Self-pay

## 2020-02-06 ENCOUNTER — Ambulatory Visit: Payer: BC Managed Care – PPO | Admitting: Family Medicine

## 2020-02-06 VITALS — BP 148/98 | HR 92 | Ht 64.0 in | Wt 279.8 lb

## 2020-02-06 DIAGNOSIS — G959 Disease of spinal cord, unspecified: Secondary | ICD-10-CM

## 2020-02-06 DIAGNOSIS — R202 Paresthesia of skin: Secondary | ICD-10-CM

## 2020-02-06 NOTE — Patient Instructions (Addendum)
Thank you for coming in today. I will arrange for MRI of Cspine and Brain with sedation.  This may be done at the hospital in [facility].   You should hear soon.

## 2020-02-06 NOTE — Progress Notes (Signed)
I, Wendy Poet, LAT, ATC, am serving as scribe for Dr. Lynne Leader.  Debra Miller is a 39 y.o. female who presents to [place] at [facility] today for f/u of LBP and B LE/UE pain and numbness/tingling.  She was last seen by Dr. Georgina Snell on 01/22/20 and noted no change in her symptoms after having an epidural on 01/13/20.  She has tried prednisone and Lyrica.  She most recently had an EMG/NCV on her R LE on 02/04/20.  Since her last visit, pt reports con't low back pain and numbness/tingling in her B UE/LEs.  She states that her R post lower leg is the most bothersome in terms of pain/ numbness/tingling.   Patient notes that she is barely able to get through lumbar MRI with oral antianxiety medication.  She does not think that she could manage a MRI of her brain or C-spine with oral medication.  Diagnostic testing: R LE NCV/EMG- 02/04/20; L-spine XR- 12/05/19   Pertinent review of systems: No fevers or chills  Relevant historical information: Migraines   Exam:  BP (!) 148/98 (BP Location: Right Arm, Patient Position: Sitting, Cuff Size: Large)   Pulse 92   Ht 5\' 4"  (1.626 m)   Wt 279 lb 12.8 oz (126.9 kg)   LMP 01/11/2020   SpO2 97%   BMI 48.03 kg/m  General: Well Developed, well nourished, and in no acute distress.   MSK: Normal lower extremity motion.  Normal extremity motion.    Lab and Radiology Results No results found for this or any previous visit (from the past 72 hour(s)). NCV with EMG(electromyography)  Result Date: 02/04/2020 [name], MD     02/04/2020  2:25 PM HISTORY: Debra Miller is a 39 year old patient with a 20-year history of low back pain.  Within the last 7 or 8 months, she has developed neck and shoulder pain and numbness of the arms and legs that has been persistent with pain that has increased on both legs, right greater than left.  She is being evaluated for the above symptoms.  MRI of the lumbar spine has been unrevealing as to the source  of the pain and numbness. NERVE CONDUCTION STUDIES: Nerve conduction studies were performed on both lower extremities. The distal motor latencies and motor amplitudes for the peroneal and posterior tibial nerves were within normal limits. The nerve conduction velocities for these nerves were also normal. The sensory latencies for the peroneal and sural nerves were within normal limits. The F wave latencies for the posterior tibial nerves were within normal limits. EMG STUDIES: EMG study was performed on the right lower extremity: The tibialis anterior muscle reveals 2 to 4K motor units with full recruitment. No fibrillations or positive waves were seen. The peroneus tertius muscle reveals 2 to 4K motor units with full recruitment. No fibrillations or positive waves were seen. The medial gastrocnemius muscle reveals 1 to 3K motor units with full recruitment. No fibrillations or positive waves were seen. The vastus lateralis muscle reveals 2 to 4K motor units with full recruitment. No fibrillations or positive waves were seen. The iliopsoas muscle reveals 2 to 4K motor units with full recruitment. No fibrillations or positive waves were seen. The biceps femoris muscle (long head) reveals 2 to 4K motor units with full recruitment. No fibrillations or positive waves were seen. The lumbosacral paraspinal muscles were tested at 3 levels, and revealed no abnormalities of insertional activity at all 3 levels tested. There was good relaxation. IMPRESSION:  Nerve conduction studies done on lower extremities were within normal limits.  No evidence of a neuropathy was seen.  EMG of the right lower extremity was unremarkable, there is no evidence of an overlying lumbosacral radiculopathy. Jill Alexanders MD 02/04/2020 2:21 PM [facility] [address] [place], [place] [address] Phone [phone] Fax [phone]       Assessment and Plan: 39 y.o. female with upper and lower paresthesias and  pain.  So far unrevealing with lumbar spine MRI and nerve conduction study.  I think at this point it is worthwhile exploring central nervous system possibility including MS with MRI brain and C-spine.  MRI C-spine will also evaluate for potential cervical myelopathy which could explain some of her symptoms.   This is complicated by her need for significant sedation with MRI.  With MRI of her head and neck I do not think that she is to be able to tolerate with oral sedation.  We will switch to IV sedation.  This will need to be done in the hospital.  Patient is aware and understands.  Recheck following MRIs.   PDMP not reviewed this encounter. Orders Placed This Encounter  Procedures  . MR BRAIN WO CONTRAST    Needs moderate sedation or general anesthesia.  Oral medication not sufficient    Standing Status:   Future    Standing Expiration Date:   02/05/2021    Scheduling Instructions:     Needs moderate sedation or general anesthesia.  Oral medication not sufficient    Order Specific Question:   What is the patient's sedation requirement?    Answer:   General Anesthesia (available ONLY at [facility])    Order Specific Question:   Does the patient have a pacemaker or implanted devices?    Answer:   No    Order Specific Question:   Preferred imaging location?    Answer:   External    Order Specific Question:   Radiology Contrast Protocol - do NOT remove file path    Answer:   \\[email or web address]  . MR CERVICAL SPINE WO CONTRAST    Needs moderate sedation or general anesthesia.  Oral medication not sufficient    Standing Status:   Future    Standing Expiration Date:   02/05/2021    Scheduling Instructions:     Needs moderate sedation or general anesthesia.  Oral medication not sufficient    Order Specific Question:   What is the patient's sedation requirement?    Answer:   General Anesthesia (available ONLY at [facility])    Order Specific Question:   Does the patient  have a pacemaker or implanted devices?    Answer:   No    Order Specific Question:   Preferred imaging location?    Answer:   External    Order Specific Question:   Radiology Contrast Protocol - do NOT remove file path    Answer:   [email or web address]   No orders of the defined types were placed in this encounter.    Discussed warning signs or symptoms. Please see discharge instructions. Patient expresses understanding.   The above documentation has been reviewed and is accurate and complete Lynne Leader, M.D.

## 2020-02-07 ENCOUNTER — Ambulatory Visit: Payer: BC Managed Care – PPO | Admitting: Rehabilitative and Restorative Service Providers"

## 2020-02-10 ENCOUNTER — Other Ambulatory Visit: Payer: Self-pay | Admitting: Family Medicine

## 2020-02-10 ENCOUNTER — Encounter: Payer: Self-pay | Admitting: Family Medicine

## 2020-02-11 ENCOUNTER — Ambulatory Visit: Payer: BC Managed Care – PPO | Admitting: Physician Assistant

## 2020-02-11 ENCOUNTER — Encounter: Payer: Self-pay | Admitting: Physician Assistant

## 2020-02-11 ENCOUNTER — Other Ambulatory Visit (HOSPITAL_COMMUNITY)
Admission: RE | Admit: 2020-02-11 | Discharge: 2020-02-11 | Disposition: A | Discharge status: Home / Self Care | Payer: BC Managed Care – PPO | Source: Ambulatory Visit | Attending: Physician Assistant | Admitting: Physician Assistant

## 2020-02-11 ENCOUNTER — Other Ambulatory Visit: Payer: Self-pay

## 2020-02-11 VITALS — BP 118/76 | HR 72 | Temp 98.0°F | Ht 64.0 in | Wt 281.6 lb

## 2020-02-11 DIAGNOSIS — N760 Acute vaginitis: Secondary | ICD-10-CM | POA: Insufficient documentation

## 2020-02-11 LAB — POC URINALSYSI DIPSTICK (AUTOMATED)
Bilirubin, UA: POSITIVE
Glucose, UA: NEGATIVE
Ketones, UA: 5
Nitrite, UA: NEGATIVE
Protein, UA: POSITIVE — AB
Spec Grav, UA: >=1.03 — AB (ref 1.010–1.025)
Urobilinogen, UA: 1 E.U./dL
pH, UA: 6 (ref 5.0–8.0)

## 2020-02-11 MED ORDER — METRONIDAZOLE 500 MG PO TABS
500.0000 mg | ORAL_TABLET | Freq: Two times a day (BID) | ORAL | 0 refills | Status: AC
Start: 2020-02-11 — End: 2020-02-18

## 2020-02-11 NOTE — Progress Notes (Signed)
Debra Miller is a 39 y.o. female here for a new problem.   I acted as a Education administrator for [employer], PA-C   History of Present Illness:   Chief Complaint  Patient presents with  . Vaginal Itching  . Dysuria  . Vaginal Discharge    HPI   Vaginal discharge Pt says that it hurts when she urinates. She also reports that she has had vaginal odor for quite some time -- smells fishy, and is very apparent during sexual activity with husband. She did get diflucan from her PCP in June for possible yeast infections. She had an ear infection earlier this year that required antibiotics and since that time she has had issues with vaginitis.  Denies: concerns for STDs, pelvic pain, hematuria, fever, chills, malaise   Past Medical History:  Diagnosis Date  . Asthma   . Atypical mole   . Bipolar disorder ([place])   . Chicken pox   . 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 Depression   . Dizziness   . Dysmenorrhea   . Eczema   . Elevated triglycerides with high cholesterol   . Frequent headaches   . GERD (gastroesophageal reflux disease)   . H/O cold sores   . High cholesterol   . Hypertension    high blood pressure readings   . Migraines    with aura  . Seasonal allergies   . Urine incontinence   . UTI (urinary tract infection)      Social History   Tobacco Use  . Smoking status: Never Smoker  . Smokeless tobacco: Never Used  Vaping Use  . Vaping Use: Never used  Substance Use Topics  . Alcohol use: No    Alcohol/week: 0.0 standard drinks  . Drug use: No    Past Surgical History:  Procedure Laterality Date  . KNEE SURGERY    . TONSILLECTOMY AND ADENOIDECTOMY    . TUBAL LIGATION      Family History  Problem Relation Age of Onset  . Arthritis Mother   . High Cholesterol Mother   . Hypertension Mother   . Diabetes Mother   . Rheum arthritis Mother   . Asthma Mother   . Hyperlipidemia Mother   . Migraines Mother   .  Thyroid disease Mother   . Arthritis Maternal Grandmother   . Breast cancer Maternal Grandmother   . High Cholesterol Maternal Grandmother   . Stroke Maternal Grandmother   . Hypertension Maternal Grandmother   . Migraines Maternal Grandmother     Allergies  Allergen Reactions  . Ceclor [Cefaclor] Hives  . Pediapred [Prednisolone Sodium Phosphate] Hives    Current Medications:   Current Outpatient Medications:  .  albuterol (VENTOLIN HFA) 108 (90 Base) MCG/ACT inhaler, TAKE 2 PUFFS BY MOUTH EVERY 6 HOURS AS NEEDED FOR WHEEZE OR SHORTNESS OF BREATH, Disp: 19 g, Rfl: 1 .  ciclopirox (LOPROX) 0.77 % cream, Apply topically 2 (two) times daily., Disp: 15 g, Rfl: 0 .  cyclobenzaprine (FLEXERIL) 10 MG tablet, Take 1 tablet (10 mg total) by mouth 3 (three) times daily as needed for muscle spasms., Disp: 30 tablet, Rfl: 0 .  Dermatological Products, Misc. (NUVAIL) SOLN, Apply 1 application topically daily., Disp: 15 mL, Rfl: 2 .  diclofenac (VOLTAREN) 75 MG EC tablet, Take 1 tablet (75 mg total) by mouth 2 (two) times daily., Disp: 30 tablet, Rfl: 0 .  gabapentin (NEURONTIN) 300 MG capsule, Take 1-2 capsules (300-600 mg  total) by mouth 3 (three) times daily as needed (nerve pain)., Disp: 180 capsule, Rfl: 3 .  metFORMIN (GLUCOPHAGE-XR) 750 MG 24 hr tablet, TAKE 2 TABLETS (1,500 MG TOTAL) BY MOUTH DAILY WITH BREAKFAST., Disp: 180 tablet, Rfl: 1 .  methocarbamol (ROBAXIN) 750 MG tablet, Take 1 tablet (750 mg total) by mouth 2 (two) times daily as needed for muscle spasms (or muscle tightness)., Disp: 20 tablet, Rfl: 0 .  montelukast (SINGULAIR) 10 MG tablet, Take 1 tablet (10 mg total) by mouth at bedtime., Disp: 30 tablet, Rfl: 3 .  spironolactone (ALDACTONE) 100 MG tablet, Take 2 tablets (200 mg total) by mouth daily., Disp: 180 tablet, Rfl: 3 .  metroNIDAZOLE (FLAGYL) 500 MG tablet, Take 1 tablet (500 mg total) by mouth 2 (two) times daily for 7 days., Disp: 14 tablet, Rfl: 0 .  Multiple Vitamin  (MULTIVITAMIN) tablet, Take 1 tablet by mouth daily. (Patient not taking: Reported on 02/11/2020), Disp: , Rfl:    Review of Systems:   Review of Systems  Gastrointestinal: Positive for abdominal pain. Negative for nausea.  Genitourinary: Negative for frequency and urgency.    Vitals:   Vitals:   02/11/20 0805  BP: 118/76  Pulse: 72  Temp: 98 F (36.7 C)  TempSrc: Temporal  SpO2: 96%  Weight: 281 lb 9.6 oz (127.7 kg)  Height: 5\' 4"  (1.626 m)     Body mass index is 48.34 kg/m.  Physical Exam:   Physical Exam Vitals and nursing note reviewed. Exam conducted with a chaperone present (Melitta).  Constitutional:      General: She is not in acute distress.    Appearance: She is well-developed. She is not ill-appearing or toxic-appearing.  Cardiovascular:     Rate and Rhythm: Normal rate and regular rhythm.     Pulses: Normal pulses.     Heart sounds: Normal heart sounds, S1 normal and S2 normal.     Comments: No LE edema Pulmonary:     Effort: Pulmonary effort is normal.     Breath sounds: Normal breath sounds.  Genitourinary:    Vagina: Normal.     Cervix: Cervical bleeding (very mild) present. No cervical motion tenderness.  Skin:    General: Skin is warm and dry.  Neurological:     Mental Status: She is alert.     GCS: GCS eye subscore is 4. GCS verbal subscore is 5. GCS motor subscore is 6.  Psychiatric:        Speech: Speech normal.        Behavior: Behavior normal. Behavior is cooperative.    Results for orders placed or performed in visit on 02/11/20  POCT Urinalysis Dipstick (Automated)  Result Value Ref Range   Color, UA Red    Clarity, UA Cloudy    Glucose, UA Negative Negative   Bilirubin, UA Positive    Ketones, UA 5    Spec Grav, UA >=1.030 (A) 1.010 - 1.025   Blood, UA     pH, UA 6.0 5.0 - 8.0   Protein, UA Positive (A) Negative   Urobilinogen, UA 1.0 0.2 or 1.0 E.U./dL   Nitrite, UA Negative    Leukocytes, UA Trace (A) Negative        Assessment and Plan:   Debra Miller was seen today for vaginal itching, dysuria and vaginal discharge.  Diagnoses and all orders for this visit:  Acute vaginitis No red flags on exam. UA negative, shows dehydration. Suspect possible BV, empirically start oral flagyl. Swab and urine  culture pending. Follow-up if symptoms worsen/persist. -     POCT Urinalysis Dipstick (Automated) -     Urine Culture -     Cervicovaginal ancillary only( [place])  Other orders -     metroNIDAZOLE (FLAGYL) 500 MG tablet; Take 1 tablet (500 mg total) by mouth 2 (two) times daily for 7 days.    . Reviewed expectations re: course of current medical issues. . Discussed self-management of symptoms. . Outlined signs and symptoms indicating need for more acute intervention. . Patient verbalized understanding and all questions were answered. . See orders for this visit as documented in the electronic medical record. . Patient received an After-Visit Summary.  CMA or LPN served as scribe during this visit. History, Physical, and Plan performed by medical provider. The above documentation has been reviewed and is accurate and complete.  Inda Coke, PA-C

## 2020-02-11 NOTE — Patient Instructions (Signed)
It was great to see you!  I will be in touch with urine and swab results.  Let's go ahead and start treat for possible BV. Antibiotic has been sent in. You cannot drink alcohol with this.  Take care,  Debra Coke PA-C  Bacterial Vaginosis  Bacterial vaginosis is a vaginal infection that occurs when the normal balance of bacteria in the vagina is disrupted. It results from an overgrowth of certain bacteria. This is the most common vaginal infection among women ages 82-44. Because bacterial vaginosis increases your risk for STIs (sexually transmitted infections), getting treated can help reduce your risk for chlamydia, gonorrhea, herpes, and HIV (human immunodeficiency virus). Treatment is also important for preventing complications in pregnant women, because this condition can cause an early (premature) delivery. What are the causes? This condition is caused by an increase in harmful bacteria that are normally present in small amounts in the vagina. However, the reason that the condition develops is not fully understood. What increases the risk? The following factors may make you more likely to develop this condition:  Having a new sexual partner or multiple sexual partners.  Having unprotected sex.  Douching.  Having an intrauterine device (IUD).  Smoking.  Drug and alcohol abuse.  Taking certain antibiotic medicines.  Being pregnant. You cannot get bacterial vaginosis from toilet seats, bedding, swimming pools, or contact with objects around you. What are the signs or symptoms? Symptoms of this condition include:  Grey or white vaginal discharge. The discharge can also be watery or foamy.  A fish-like odor with discharge, especially after sexual intercourse or during menstruation.  Itching in and around the vagina.  Burning or pain with urination. Some women with bacterial vaginosis have no signs or symptoms. How is this diagnosed? This condition is diagnosed based  on:  Your medical history.  A physical exam of the vagina.  Testing a sample of vaginal fluid under a microscope to look for a large amount of bad bacteria or abnormal cells. Your health care provider may use a cotton swab or a small wooden spatula to collect the sample. How is this treated? This condition is treated with antibiotics. These may be given as a pill, a vaginal cream, or a medicine that is put into the vagina (suppository). If the condition comes back after treatment, a second round of antibiotics may be needed. Follow these instructions at home: Medicines  Take over-the-counter and prescription medicines only as told by your health care provider.  Take or use your antibiotic as told by your health care provider. Do not stop taking or using the antibiotic even if you start to feel better. General instructions  If you have a female sexual partner, tell her that you have a vaginal infection. She should see her health care provider and be treated if she has symptoms. If you have a female sexual partner, he does not need treatment.  During treatment: ? Avoid sexual activity until you finish treatment. ? Do not douche. ? Avoid alcohol as directed by your health care provider. ? Avoid breastfeeding as directed by your health care provider.  Drink enough water and fluids to keep your urine clear or pale yellow.  Keep the area around your vagina and rectum clean. ? Wash the area daily with warm water. ? Wipe yourself from front to back after using the toilet.  Keep all follow-up visits as told by your health care provider. This is important. How is this prevented?  Do not douche.  Wash the outside of your vagina with warm water only.  Use protection when having sex. This includes latex condoms and dental dams.  Limit how many sexual partners you have. To help prevent bacterial vaginosis, it is best to have sex with just one partner (monogamous).  Make sure you and your  sexual partner are tested for STIs.  Wear cotton or cotton-lined underwear.  Avoid wearing tight pants and pantyhose, especially during summer.  Limit the amount of alcohol that you drink.  Do not use any products that contain nicotine or tobacco, such as cigarettes and e-cigarettes. If you need help quitting, ask your health care provider.  Do not use illegal drugs. Where to find more information  Centers for Disease Control and Prevention: AppraiserFraud.fi  American Sexual Health Association (ASHA): www.ashastd.org  [place].S. Department of Health and Financial controller, Office on Women's Health: DustingSprays.pl or SecuritiesCard.it Contact a health care provider if:  Your symptoms do not improve, even after treatment.  You have more discharge or pain when urinating.  You have a fever.  You have pain in your abdomen.  You have pain during sex.  You have vaginal bleeding between periods. Summary  Bacterial vaginosis is a vaginal infection that occurs when the normal balance of bacteria in the vagina is disrupted.  Because bacterial vaginosis increases your risk for STIs (sexually transmitted infections), getting treated can help reduce your risk for chlamydia, gonorrhea, herpes, and HIV (human immunodeficiency virus). Treatment is also important for preventing complications in pregnant women, because the condition can cause an early (premature) delivery.  This condition is treated with antibiotic medicines. These may be given as a pill, a vaginal cream, or a medicine that is put into the vagina (suppository). This information is not intended to replace advice given to you by your health care provider. Make sure you discuss any questions you have with your health care provider. Document Revised: 06/02/2017 Document Reviewed: 03/05/2016 Elsevier Patient Education  2020 [employer].

## 2020-02-12 LAB — URINE CULTURE
MICRO NUMBER:: 10808245
SPECIMEN QUALITY:: ADEQUATE

## 2020-02-12 LAB — CERVICOVAGINAL ANCILLARY ONLY
Bacterial Vaginitis (gardnerella): POSITIVE — AB
Candida Glabrata: NEGATIVE
Candida Vaginitis: NEGATIVE
Comment: NEGATIVE
Comment: NEGATIVE
Comment: NEGATIVE

## 2020-02-17 ENCOUNTER — Other Ambulatory Visit (HOSPITAL_COMMUNITY): Payer: Self-pay | Admitting: Family Medicine

## 2020-02-17 ENCOUNTER — Other Ambulatory Visit: Payer: Self-pay | Admitting: Family Medicine

## 2020-02-24 ENCOUNTER — Ambulatory Visit (HOSPITAL_COMMUNITY): Payer: BC Managed Care – PPO | Attending: Family Medicine

## 2020-02-26 ENCOUNTER — Encounter (HOSPITAL_COMMUNITY): Payer: Self-pay | Admitting: *Deleted

## 2020-02-26 NOTE — Progress Notes (Signed)
Marleta, Scheduler (MRI) stated that pt was canceled because she did not show up for her COVID test.

## 2020-02-27 ENCOUNTER — Ambulatory Visit: Admit: 2020-02-27 | Payer: BC Managed Care – PPO

## 2020-02-27 ENCOUNTER — Ambulatory Visit (HOSPITAL_COMMUNITY): Admission: RE | Admit: 2020-02-27 | Payer: BC Managed Care – PPO | Source: Ambulatory Visit

## 2020-02-27 ENCOUNTER — Ambulatory Visit (HOSPITAL_COMMUNITY): Payer: BC Managed Care – PPO

## 2020-02-27 ENCOUNTER — Encounter (HOSPITAL_COMMUNITY): Payer: Self-pay

## 2020-02-27 HISTORY — DX: Obesity, unspecified: E66.9

## 2020-02-27 SURGERY — MRI WITH ANESTHESIA
Anesthesia: General

## 2020-03-10 ENCOUNTER — Encounter: Payer: Self-pay | Admitting: Neurology

## 2020-03-12 ENCOUNTER — Other Ambulatory Visit: Payer: Self-pay

## 2020-03-12 ENCOUNTER — Ambulatory Visit: Payer: BC Managed Care – PPO | Admitting: Adult Health

## 2020-03-12 ENCOUNTER — Encounter: Payer: Self-pay | Admitting: Adult Health

## 2020-03-12 VITALS — BP 127/80 | HR 73 | Ht 63.0 in | Wt 291.0 lb

## 2020-03-12 DIAGNOSIS — Z9989 Dependence on other enabling machines and devices: Secondary | ICD-10-CM | POA: Diagnosis not present

## 2020-03-12 DIAGNOSIS — G4733 Obstructive sleep apnea (adult) (pediatric): Secondary | ICD-10-CM

## 2020-03-12 NOTE — Progress Notes (Signed)
PATIENT: Debra Miller DOB: 1981/06/10  REASON FOR VISIT: follow up HISTORY FROM: patient  HISTORY OF PRESENT ILLNESS: Today 03/12/20:  Debra Miller is a 39 year old female with a history of obstructive sleep apnea on CPAP.  Her download indicates that she used her machine nightly for compliance of 100%.  She used her machine greater than 4 hours 25 days for compliance of 83%.  On average she uses her machine 5 hours and 20 minutes.  Her residual AHI is 0.3 on 6 to 16 cm of water with EPR of 2.  Leak in the 95th percentile is 3.1.  She reports that the CPAP is working well for her.  She still feels tired and sleepy during the day but reports that she may see a slight improvement.  HISTORY Debra Miller Debra Miller a 39 Year- old White or Caucasian female patientseen here on 11-12-2019 upon a referralon 11/12/2019 from Dr [name]. Chiefconcernaccording to patient :  I am just drained, just no energy - I was diagnosed with PCOS. I didn't have a period for years- but I had 5 pregnancies, and I got pregnant easily, 3 living children. I lost hair, and acne was going crazy, gained weight.  I have the pleasure of seeing Debra Miller today,a right -handed Caucasian female with a possible sleep disorder. She  has a past medical history of Asthma, Bipolar disorder ([place]), Chicken pox, CIN III (cervical intraepithelial neoplasia grade III) with severe dysplasia (10/2004), Depression, Dizziness, Dysmenorrhea, Eczema, Elevated triglycerides with high cholesterol, Frequent headaches, GERD (gastroesophageal reflux disease), H/O cold sores, High cholesterol, Hypertension, Migraines, Seasonal allergies, Urine incontinence, and UTI (urinary tract infection). she reports RLS , but only in the right leg. She has ankle edema.  She had swimmer's ear in 08-2019, ear infection, swelling and pain. Hirsitusm.    Sleeprelevant medical history: Nocturia; 4-5 times (!), asthma in childhood, Tonsillectomy, had tubes in her ears.  Weight gain-  Each time pregnant , she would gain up to 70 pounds and could only lose a part of it. At the heaviest 286 pounds,  RLS started in pregnancy, while anemic .   Familymedical /sleep history: Sister and mother on CPAP with OSA.  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  Pets are not present. Tobacco use: none  ETOH use; none , Caffeine intake in form of Coffee( none ) Soda( once in a month) Tea ( 2/ week ) no energy drinks. Regular exercise - " no energy to do it"  Sleep habits are as follows:The patient's dinner time is between 6-8 PM.  The patient goes to bed at 10.30 PM and struggles to sleep- once asleep , she continues to sleep for only 1-2  hours, wakes for many, many bathroom breaks,and she is a light sleeper.  The preferred sleep position is left, right and supine  with the support of 2 pillows.  Dreams are reportedly frequent. averaging 5 hours of nocturnal sleep. Has GERD at night, post nasal drip. 7.45 AM is the usual rise time. The patient wakes up at 7.15-7.30 with several alarms.  She reports not feeling refreshed or restored in AM, with symptoms such as dry mouth , morning headaches- migraines all the time-  There when she wakes up, and residual fatigue. Naps are taken infrequently, lasting from 4-5 hours and are more refreshing than nocturnal sleep.   REVIEW OF SYSTEMS: Out of a  complete 14 system review of symptoms, the patient complains only of the following symptoms, and all other reviewed systems are negative.  FSS 58 ESS 12  ALLERGIES: Allergies  Allergen Reactions  . Ceclor [Cefaclor] Hives  . Pediapred [Prednisolone Sodium Phosphate] Hives    HOME MEDICATIONS: Outpatient Medications Prior to Visit  Medication Sig Dispense Refill  . albuterol (VENTOLIN HFA) 108 (90 Base) MCG/ACT inhaler TAKE 2 PUFFS BY MOUTH EVERY 6 HOURS AS NEEDED FOR WHEEZE OR SHORTNESS OF  BREATH 19 g 1  . gabapentin (NEURONTIN) 300 MG capsule Take 1-2 capsules (300-600 mg total) by mouth 3 (three) times daily as needed (nerve pain). 180 capsule 3  . metFORMIN (GLUCOPHAGE-XR) 750 MG 24 hr tablet TAKE 2 TABLETS (1,500 MG TOTAL) BY MOUTH DAILY WITH BREAKFAST. 180 tablet 1  . montelukast (SINGULAIR) 10 MG tablet TAKE 1 TABLET BY MOUTH EVERYDAY AT BEDTIME 90 tablet 1  . spironolactone (ALDACTONE) 100 MG tablet Take 2 tablets (200 mg total) by mouth daily. 180 tablet 3  . ciclopirox (LOPROX) 0.77 % cream Apply topically 2 (two) times daily. 15 g 0  . cyclobenzaprine (FLEXERIL) 10 MG tablet Take 1 tablet (10 mg total) by mouth 3 (three) times daily as needed for muscle spasms. 30 tablet 0  . Dermatological Products, Misc. (NUVAIL) SOLN Apply 1 application topically daily. 15 mL 2  . diclofenac (VOLTAREN) 75 MG EC tablet Take 1 tablet (75 mg total) by mouth 2 (two) times daily. 30 tablet 0  . methocarbamol (ROBAXIN) 750 MG tablet Take 1 tablet (750 mg total) by mouth 2 (two) times daily as needed for muscle spasms (or muscle tightness). 20 tablet 0  . Multiple Vitamin (MULTIVITAMIN) tablet Take 1 tablet by mouth daily. (Patient not taking: Reported on 02/11/2020)     No facility-administered medications prior to visit.    PAST MEDICAL HISTORY: Past Medical History:  Diagnosis Date  . Asthma   . Atypical mole   . Bipolar disorder ([place])   . Chicken pox   . 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 Depression   . Dizziness   . Dysmenorrhea   . Eczema   . Elevated triglycerides with high cholesterol   . Frequent headaches   . GERD (gastroesophageal reflux disease)   . H/O cold sores   . High cholesterol   . Hypertension    high blood pressure readings   . Migraines    with aura  . Obesity   . Seasonal allergies   . Urine incontinence   . UTI (urinary tract infection)     PAST SURGICAL HISTORY: Past Surgical History:   Procedure Laterality Date  . KNEE SURGERY    . TONSILLECTOMY AND ADENOIDECTOMY    . TUBAL LIGATION      FAMILY HISTORY: Family History  Problem Relation Age of Onset  . Arthritis Mother   . High Cholesterol Mother   . Hypertension Mother   . Diabetes Mother   . Rheum arthritis Mother   . Asthma Mother   . Hyperlipidemia Mother   . Migraines Mother   . Thyroid disease Mother   . Arthritis Maternal Grandmother   . Breast cancer Maternal Grandmother   . High Cholesterol Maternal Grandmother   . Stroke Maternal Grandmother   . Hypertension Maternal Grandmother   . Migraines Maternal Grandmother     SOCIAL HISTORY: Social History   Socioeconomic History  . Marital status: Married    Spouse name: Not  on file  . Number of children: Not on file  . Years of education: Not on file  . Highest education level: Not on file  Occupational History  . Not on file  Tobacco Use  . Smoking status: Never Smoker  . Smokeless tobacco: Never Used  Vaping Use  . Vaping Use: Never used  Substance and Sexual Activity  . Alcohol use: No    Alcohol/week: 0.0 standard drinks  . Drug use: No  . Sexual activity: Yes    Partners: Male    Birth control/protection: Surgical    Comment: Tubal  Other Topics Concern  . Not on file  Social History Narrative   Work or School: homemaker      Home Situation: lives with husband and 5 kids (ages 95-14 in 2014)      Spiritual Beliefs: Christian      Lifestyle: no regular exercise, poor diet, lots of soda            Social Determinants of Health   Financial Resource Strain:   . Difficulty of Paying Living Expenses: Not on file  Food Insecurity:   . Worried About Charity fundraiser in the Last Year: Not on file  . Ran Out of Food in the Last Year: Not on file  Transportation Needs:   . Lack of Transportation (Medical): Not on file  . Lack of Transportation (Non-Medical): Not on file  Physical Activity:   . Days of Exercise per Week: Not  on file  . Minutes of Exercise per Session: Not on file  Stress:   . Feeling of Stress : Not on file  Social Connections:   . Frequency of Communication with Friends and Family: Not on file  . Frequency of Social Gatherings with Friends and Family: Not on file  . Attends Religious Services: Not on file  . Active Member of Clubs or Organizations: Not on file  . Attends Archivist Meetings: Not on file  . Marital Status: Not on file  Intimate Partner Violence:   . Fear of Current or Ex-Partner: Not on file  . Emotionally Abused: Not on file  . Physically Abused: Not on file  . Sexually Abused: Not on file      PHYSICAL EXAM  Vitals:   03/12/20 1014  BP: 127/80  Pulse: 73  Weight: 291 lb (132 kg)  Height: 5\' 3"  (1.6 m)   Body mass index is 51.55 kg/m.  Generalized: Well developed, in no acute distress  Chest: Lungs clear to auscultation bilaterally  Neurological examination  Mentation: Alert oriented to time, place, history taking. Follows all commands speech and language fluent Cranial nerve II-XII: Extraocular movements were full, visual field were full on confrontational test Head turning and shoulder shrug  were normal and symmetric. Motor: The motor testing reveals 5 over 5 strength of all 4 extremities. Good symmetric motor tone is noted throughout.  Sensory: Sensory testing is intact to soft touch on all 4 extremities. No evidence of extinction is noted.  Gait and station: Gait is normal.    DIAGNOSTIC DATA (LABS, IMAGING, TESTING) - I reviewed patient records, labs, notes, testing and imaging myself where available.  Lab Results  Component Value Date   WBC 9.1 11/28/2019   HGB 14.5 11/28/2019   HCT 42.7 11/28/2019   MCV 88.0 11/28/2019   PLT 253.0 11/28/2019      Component Value Date/Time   NA 136 11/28/2019 1421   K 4.6 11/28/2019 1421  CL 102 11/28/2019 1421   CO2 26 11/28/2019 1421   GLUCOSE 100 (H) 11/28/2019 1421   BUN 16 11/28/2019  1421   CREATININE 0.74 11/28/2019 1421   CALCIUM 10.0 11/28/2019 1421   PROT 7.4 11/28/2019 1421   ALBUMIN 5.0 11/28/2019 1421   AST 25 11/28/2019 1421   ALT 38 (H) 11/28/2019 1421   ALKPHOS 70 11/28/2019 1421   BILITOT 0.6 11/28/2019 1421   GFRNONAA >60 12/11/2016 0358   GFRAA >60 12/11/2016 0358   Lab Results  Component Value Date   CHOL 209 (H) 07/23/2019   HDL 39.10 07/23/2019   LDLDIRECT 126.0 07/23/2019   TRIG 319.0 (H) 07/23/2019   CHOLHDL 5 07/23/2019   Lab Results  Component Value Date   HGBA1C 5.3 07/23/2019   Lab Results  Component Value Date   VITAMINB12 468 11/28/2019   Lab Results  Component Value Date   TSH 2.14 07/23/2019      ASSESSMENT AND PLAN 39 y.o. year old female  has a past medical history of Asthma, Atypical mole, Bipolar disorder ([place]), Chicken pox, CIN III (cervical intraepithelial neoplasia grade III) with severe dysplasia (10/2004), Depression, Dizziness, Dysmenorrhea, Eczema, Elevated triglycerides with high cholesterol, Frequent headaches, GERD (gastroesophageal reflux disease), H/O cold sores, High cholesterol, Hypertension, Migraines, Obesity, Seasonal allergies, Urine incontinence, and UTI (urinary tract infection). here with:  1. OSA on CPAP  - CPAP compliance excellent - Good treatment of AHI  - Encourage patient to use CPAP nightly and > 4 hours each night - F/U in 6 months or sooner if needed   I spent 20 minutes of face-to-face and non-face-to-face time with patient.  This included previsit chart review, lab review, study review, order entry, electronic health record documentation, patient education.  Ward Givens, MSN, NP-C 03/12/2020, 10:16 AM [facility] [address], [place], [place] [address] [phone]

## 2020-03-12 NOTE — Patient Instructions (Signed)
Continue using CPAP nightly and greater than 4 hours each night °If your symptoms worsen or you develop new symptoms please let us know.  ° °

## 2020-03-17 ENCOUNTER — Other Ambulatory Visit: Payer: Self-pay | Admitting: Family Medicine

## 2020-03-18 ENCOUNTER — Encounter: Payer: Self-pay | Admitting: Family Medicine

## 2020-03-19 MED ORDER — GABAPENTIN 300 MG PO CAPS
900.0000 mg | ORAL_CAPSULE | Freq: Three times a day (TID) | ORAL | 3 refills | Status: DC
Start: 2020-03-19 — End: 2020-09-09

## 2020-04-22 ENCOUNTER — Other Ambulatory Visit: Payer: Self-pay

## 2020-04-22 ENCOUNTER — Ambulatory Visit: Payer: BC Managed Care – PPO | Admitting: Family Medicine

## 2020-04-22 VITALS — BP 150/90 | HR 80 | Temp 97.4°F | Ht 63.0 in | Wt 288.2 lb

## 2020-04-22 DIAGNOSIS — F419 Anxiety disorder, unspecified: Secondary | ICD-10-CM

## 2020-04-22 DIAGNOSIS — Z6841 Body Mass Index (BMI) 40.0 and over, adult: Secondary | ICD-10-CM

## 2020-04-22 DIAGNOSIS — J45909 Unspecified asthma, uncomplicated: Secondary | ICD-10-CM

## 2020-04-22 DIAGNOSIS — I10 Essential (primary) hypertension: Secondary | ICD-10-CM | POA: Diagnosis not present

## 2020-04-22 DIAGNOSIS — E8881 Metabolic syndrome: Secondary | ICD-10-CM | POA: Diagnosis not present

## 2020-04-22 MED ORDER — CITALOPRAM HYDROBROMIDE 20 MG PO TABS: 20.0000 mg | ORAL_TABLET | Freq: Every day | ORAL | 3 refills | Status: DC

## 2020-04-22 MED ORDER — OZEMPIC (0.25 OR 0.5 MG/DOSE) 2 MG/1.5ML ~~LOC~~ SOPN: 0.5000 mg | PEN_INJECTOR | SUBCUTANEOUS | 3 refills | Status: DC

## 2020-04-22 NOTE — Assessment & Plan Note (Signed)
On Metformin 1500 daily.  Will start Ozempic 0.25 mg weekly for 4 weeks and then increase to 0.5 mg weekly.  Follow-up 3 months.  Discussed potential side effects.

## 2020-04-22 NOTE — Assessment & Plan Note (Signed)
BMI 51. We will continue Metformin.  Also start semaglutide for insulin resistance which should help with weight loss as well.  She will follow-up in 3 months.

## 2020-04-22 NOTE — Assessment & Plan Note (Signed)
Having more shortness of breath and having to use albuterol more frequently.  She has not had PFTs in several years.  Place referral to pulmonology for further evaluation.

## 2020-04-22 NOTE — Assessment & Plan Note (Signed)
Much worsened due to chronic medical conditions an dlife stress.  Discussed treatment options.  Gave information to schedule appointment with counselor.  Also start Celexa 20 mg daily.  Follow-up in 3 weeks via MyChart.

## 2020-04-22 NOTE — Patient Instructions (Signed)
It was very nice to see you today!  We will start Ozempic today.  Please take 0.25 mg once weekly for the next 4 weeks and then increase to 0.5 mg weekly.  Please let me know if you have any side effects.  We will start Celexa today.  Take 20 mg daily.  Please send a MyChart message to check in a few weeks,   I will place a referral for you to see a pulmonologist.  Please come back for your next visit in 3 months.  Please come back to see me sooner if needed.  Take care, Dr [name]  Please try these tips to maintain a healthy lifestyle:   Eat at least 3 REAL meals and 1-2 snacks per day.  Aim for no more than 5 hours between eating.  If you eat breakfast, please do so within one hour of getting up.    Each meal should contain half fruits/vegetables, one quarter protein, and one quarter carbs (no bigger than a computer mouse)   Cut down on sweet beverages. This includes juice, soda, and sweet tea.     Drink at least 1 glass of water with each meal and aim for at least 8 glasses per day   Exercise at least 150 minutes every week.

## 2020-04-22 NOTE — Assessment & Plan Note (Signed)
Above goal today though has been previously well controlled at other office visits.  We will continue spironolactone 200 mg daily.  Continue monitoring 140/90 or lower.  Follow-up in 3 months.

## 2020-04-22 NOTE — Progress Notes (Signed)
   Debra Miller is a 39 y.o. female who presents today for an office visit.  Assessment/Plan:  Chronic Problems Addressed Today: Class 3 severe obesity without serious comorbidity with body mass index (BMI) of 45.0 to 49.9 in adult (HCC) BMI 51. We will continue Metformin.  Also start semaglutide for insulin resistance which should help with weight loss as well.  She will follow-up in 3 months.  Anxiety Much worsened due to chronic medical conditions an dlife stress.  Discussed treatment options.  Gave information to schedule appointment with counselor.  Also start Celexa 20 mg daily.  Follow-up in 3 weeks via MyChart.  Insulin resistance On Metformin 1500 daily.  Will start Ozempic 0.25 mg weekly for 4 weeks and then increase to 0.5 mg weekly.  Follow-up 3 months.  Discussed potential side effects.  Essential hypertension Above goal today though has been previously well controlled at other office visits.  We will continue spironolactone 200 mg daily.  Continue monitoring 140/90 or lower.  Follow-up in 3 months.  Asthma Having more shortness of breath and having to use albuterol more frequently.  She has not had PFTs in several years.  Place referral to pulmonology for further evaluation.     Subjective:  HPI:  Patient here for 63-month follow up visit. She has been following with sports medicine and neurology since her last visit for lumbar radiculopathy.  She has MRI scheduled but is not yet been going.  She has had persistent issues with feeling short of breath.  She is also having shortness of breath while sleeping.  She is currently using CPAP and does not feel like it is helping.  She is taking Singulair for her asthma which seems to help.  She is also taking albuterol occasionally with help.  Asthma seems to have worsened recently.  She is very frustrated about her inability to lose weight.  She has been trying diet and exercise without any significant improvement.  She has been  taking Metformin as well which does not seem to help.       Objective:  Physical Exam: BP (!) 150/90   Pulse 80   Temp (!) 97.4 F (36.3 C) (Temporal)   Ht 5\' 3"  (1.6 m)   Wt 288 lb 3.2 oz (130.7 kg)   LMP 03/13/2020   SpO2 98%   BMI 51.05 kg/m   Wt Readings from Last 3 Encounters:  04/22/20 288 lb 3.2 oz (130.7 kg)  03/12/20 291 lb (132 kg)  02/11/20 281 lb 9.6 oz (127.7 kg)    Gen: No acute distress, resting comfortably CV: Regular rate and rhythm with no murmurs appreciated Pulm: Normal work of breathing, clear to auscultation bilaterally with no crackles, wheezes, or rhonchi Neuro: Grossly normal, moves all extremities Psych: Normal affect and thought content  Time Spent: 45 minutes of total time was spent on the date of the encounter performing the following actions: chart review prior to seeing the patient including recent visits with sports medicine and neurology, obtaining history, performing a medically necessary exam, counseling on the treatment plan including starting Celexa and Ozempic today, placing orders, and documenting in our EHR.        Algis Greenhouse. [name], MD 04/22/2020 11:14 AM

## 2020-05-15 ENCOUNTER — Other Ambulatory Visit: Payer: Self-pay | Admitting: Family Medicine

## 2020-05-15 ENCOUNTER — Other Ambulatory Visit: Payer: Self-pay

## 2020-06-15 ENCOUNTER — Other Ambulatory Visit: Payer: Self-pay | Admitting: Family Medicine

## 2020-06-15 NOTE — Telephone Encounter (Signed)
Last OV 04/22/20 Last refill 04/22/20 # 1.5 mL/ 3 Next OV 07/23/20

## 2020-06-24 ENCOUNTER — Encounter: Payer: Self-pay | Admitting: Family Medicine

## 2020-06-24 DIAGNOSIS — G959 Disease of spinal cord, unspecified: Secondary | ICD-10-CM

## 2020-06-24 DIAGNOSIS — R202 Paresthesia of skin: Secondary | ICD-10-CM

## 2020-06-24 NOTE — Telephone Encounter (Signed)
See note below

## 2020-06-29 ENCOUNTER — Other Ambulatory Visit: Payer: Self-pay | Admitting: Family Medicine

## 2020-07-23 ENCOUNTER — Ambulatory Visit: Payer: BC Managed Care – PPO | Admitting: Family Medicine

## 2020-07-27 ENCOUNTER — Institutional Professional Consult (permissible substitution): Payer: BC Managed Care – PPO | Admitting: Pulmonary Disease

## 2020-08-20 ENCOUNTER — Encounter: Payer: Self-pay | Admitting: Family Medicine

## 2020-08-20 MED ORDER — AMOXICILLIN 875 MG PO TABS: 875.0000 mg | ORAL_TABLET | Freq: Two times a day (BID) | ORAL | 0 refills | Status: DC

## 2020-08-20 NOTE — Telephone Encounter (Signed)
Please advise 

## 2020-09-02 ENCOUNTER — Ambulatory Visit (INDEPENDENT_AMBULATORY_CARE_PROVIDER_SITE_OTHER): Payer: BC Managed Care – PPO

## 2020-09-02 ENCOUNTER — Encounter: Payer: Self-pay | Admitting: Pulmonary Disease

## 2020-09-02 ENCOUNTER — Ambulatory Visit: Payer: BC Managed Care – PPO | Admitting: Pulmonary Disease

## 2020-09-02 ENCOUNTER — Other Ambulatory Visit: Payer: Self-pay

## 2020-09-02 VITALS — BP 130/80 | HR 78 | Temp 97.8°F | Ht 63.0 in | Wt 278.0 lb

## 2020-09-02 DIAGNOSIS — J4521 Mild intermittent asthma with (acute) exacerbation: Secondary | ICD-10-CM

## 2020-09-02 DIAGNOSIS — R0600 Dyspnea, unspecified: Secondary | ICD-10-CM

## 2020-09-02 DIAGNOSIS — G4733 Obstructive sleep apnea (adult) (pediatric): Secondary | ICD-10-CM | POA: Diagnosis not present

## 2020-09-02 DIAGNOSIS — K219 Gastro-esophageal reflux disease without esophagitis: Secondary | ICD-10-CM

## 2020-09-02 DIAGNOSIS — Z9989 Dependence on other enabling machines and devices: Secondary | ICD-10-CM

## 2020-09-02 MED ORDER — FLOVENT HFA 110 MCG/ACT IN AERO: 2.0000 | INHALATION_SPRAY | Freq: Two times a day (BID) | RESPIRATORY_TRACT | 12 refills | Status: DC

## 2020-09-02 MED ORDER — OMEPRAZOLE 40 MG PO CPDR: 40.0000 mg | DELAYED_RELEASE_CAPSULE | Freq: Every day | ORAL | 6 refills | Status: DC

## 2020-09-02 NOTE — Patient Instructions (Addendum)
Start flovent 123mcg 2 puffs twice daily - rinse mouth after each use  Continue to use albuterol as needed 1-3 puffs every 4-6 hours for dyspnea, chest tightness, cough or wheezing.  Continue Singulair daily  Start omeprazole 40mg  daily for reflux, stay on this medicine for at least 3 months

## 2020-09-02 NOTE — Progress Notes (Signed)
Synopsis: Referred in March 2022 by [name], MD for asthma  Subjective:   PATIENT ID: Debra Miller GENDER: female DOB: 11/09/1980, MRN: [id number]   HPI  Chief Complaint  Patient presents with  . Consult    Pt stated that her asthma was severe when she was little.  Her asthma has been worse over the last 4 years.  SHOB all the time.    Debra Miller is a 40 year old woman, never smoker with hypertension, obesity, mild obstructive sleep apnea on CPAP, anxiety and asthma who is referred to pulmonary clinic for asthma evaluation.   She has history of childhood asthma where she required maintenance inhaler therapy and frequent nebulizer treatments. Her breathing improved in her 31s and then over the past 4 years her breathing symptoms have worsened.   She was diagnosed with PCOS 1.5 years ago after she was experiencing unexpected weight gain. She was diagnosed with mild obstructive sleep apnea in 2021 and is on auto-titrating CPAP with an AHI of 0.3/hr based on her last sleep appointment 03/2020.   She currently uses albuterol 2 times per day without much benefit.   She is experiencing dyspnea mainly with exertion but also at rest. She reports taking large gasping breaths spontaneously while sitting and watching tv. She has a cough with the dyspnea with exertion. She reports occasional wheezing.   Her mother and sister have asthma. Her kids also have asthma. She has 5 kids, ages 73 to 26.   She experiences reflux symptoms 2-3 times per week. She has difficulty swallowing pills at times and feels her esophagus is narrow. She has never had an EGD.   She denies sinus congestion or drainage.   Past Medical History:  Diagnosis Date  . Asthma   . Atypical mole   . Bipolar disorder ([place])   . Chicken pox   . 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 Depression   . Dizziness   . Dysmenorrhea   . Eczema   . Elevated triglycerides with high  cholesterol   . Frequent headaches   . GERD (gastroesophageal reflux disease)   . H/O cold sores   . High cholesterol   . Hypertension    high blood pressure readings   . Migraines    with aura  . Obesity   . Seasonal allergies   . Urine incontinence   . UTI (urinary tract infection)      Family History  Problem Relation Age of Onset  . Arthritis Mother   . High Cholesterol Mother   . Hypertension Mother   . Diabetes Mother   . Rheum arthritis Mother   . Asthma Mother   . Hyperlipidemia Mother   . Migraines Mother   . Thyroid disease Mother   . Arthritis Maternal Grandmother   . Breast cancer Maternal Grandmother   . High Cholesterol Maternal Grandmother   . Stroke Maternal Grandmother   . Hypertension Maternal Grandmother   . Migraines Maternal Grandmother      Social History   Socioeconomic History  . Marital status: Married    Spouse name: Not on file  . Number of children: Not on file  . Years of education: Not on file  . Highest education level: Not on file  Occupational History  . Not on file  Tobacco Use  . Smoking status: Never Smoker  . Smokeless tobacco: Never Used  Vaping Use  . Vaping Use: Never used  Substance and Sexual Activity  . Alcohol use: No    Alcohol/week: 0.0 standard drinks  . Drug use: No  . Sexual activity: Yes    Partners: Male    Birth control/protection: Surgical    Comment: Tubal  Other Topics Concern  . Not on file  Social History Narrative   Work or School: homemaker      Home Situation: lives with husband and 5 kids (ages 66-14 in 2014)      Spiritual Beliefs: Christian      Lifestyle: no regular exercise, poor diet, lots of soda            Social Determinants of Health   Financial Resource Strain: Not on file  Food Insecurity: Not on file  Transportation Needs: Not on file  Physical Activity: Not on file  Stress: Not on file  Social Connections: Not on file  Intimate Partner Violence: Not on file      Allergies  Allergen Reactions  . Ceclor [Cefaclor] Hives  . Pediapred [Prednisolone Sodium Phosphate] Hives     Outpatient Medications Prior to Visit  Medication Sig Dispense Refill  . albuterol (VENTOLIN HFA) 108 (90 Base) MCG/ACT inhaler TAKE 2 PUFFS BY MOUTH EVERY 6 HOURS AS NEEDED FOR WHEEZE OR SHORTNESS OF BREATH 19 g 1  . citalopram (CELEXA) 20 MG tablet TAKE 1 TABLET BY MOUTH EVERY DAY 90 tablet 2  . gabapentin (NEURONTIN) 300 MG capsule Take 3 capsules (900 mg total) by mouth 3 (three) times daily. 810 capsule 3  . metFORMIN (GLUCOPHAGE-XR) 750 MG 24 hr tablet TAKE 2 TABLETS (1,500 MG TOTAL) BY MOUTH DAILY WITH BREAKFAST. 180 tablet 1  . montelukast (SINGULAIR) 10 MG tablet TAKE 1 TABLET BY MOUTH EVERYDAY AT BEDTIME 90 tablet 1  . OZEMPIC, 0.25 OR 0.5 MG/DOSE, 2 MG/1.5ML SOPN INJECT 0.5 MG INTO THE SKIN ONCE A WEEK. 3 mL 2  . spironolactone (ALDACTONE) 100 MG tablet Take 2 tablets (200 mg total) by mouth daily. 180 tablet 3  . amoxicillin (AMOXIL) 875 MG tablet Take 1 tablet (875 mg total) by mouth 2 (two) times daily. (Patient not taking: Reported on 09/02/2020) 14 tablet 0   No facility-administered medications prior to visit.    Review of Systems  Constitutional: Negative for chills, fever, malaise/fatigue and weight loss.  HENT: Negative for congestion, sinus pain and sore throat.   Eyes: Negative.   Respiratory: Positive for cough, shortness of breath and wheezing. Negative for hemoptysis and sputum production.   Cardiovascular: Negative for chest pain, palpitations, orthopnea, claudication and leg swelling.  Gastrointestinal: Negative for abdominal pain, heartburn, nausea and vomiting.  Genitourinary: Negative.   Musculoskeletal: Negative for joint pain and myalgias.  Skin: Negative for rash.  Neurological: Negative for weakness.  Endo/Heme/Allergies: Negative.   Psychiatric/Behavioral: The patient is nervous/anxious.    Objective:   Vitals:   09/02/20 1104   Pulse: 78  Temp: 97.8 F (36.6 C)  TempSrc: Tympanic  SpO2: 98%  Weight: 278 lb (126.1 kg)  Height: 5\' 3"  (1.6 m)   Physical Exam Constitutional:      General: She is not in acute distress.    Appearance: She is obese. She is not ill-appearing.  HENT:     Head: Normocephalic and atraumatic.  Eyes:     General: No scleral icterus.    Conjunctiva/sclera: Conjunctivae normal.     Pupils: Pupils are equal, round, and reactive to light.  Cardiovascular:     Rate and Rhythm: Normal rate and  regular rhythm.     Pulses: Normal pulses.     Heart sounds: Normal heart sounds. No murmur heard.   Pulmonary:     Effort: Pulmonary effort is normal.     Breath sounds: Normal breath sounds. No wheezing, rhonchi or rales.  Abdominal:     General: Bowel sounds are normal.     Palpations: Abdomen is soft.  Musculoskeletal:     Right lower leg: No edema.     Left lower leg: No edema.  Lymphadenopathy:     Cervical: No cervical adenopathy.  Skin:    General: Skin is warm and dry.  Neurological:     General: No focal deficit present.     Mental Status: She is alert.  Psychiatric:        Mood and Affect: Mood normal.        Behavior: Behavior normal.        Thought Content: Thought content normal.        Judgment: Judgment normal.    CBC    Component Value Date/Time   WBC 9.1 11/28/2019 1421   RBC 4.85 11/28/2019 1421   HGB 14.5 11/28/2019 1421   HGB 14.0 02/19/2014 1151   HCT 42.7 11/28/2019 1421   PLT 253.0 11/28/2019 1421   MCV 88.0 11/28/2019 1421   MCH 29.5 12/11/2016 0358   MCHC 33.9 11/28/2019 1421   RDW 13.0 11/28/2019 1421   LYMPHSABS 3.0 11/28/2019 1421   MONOABS 0.5 11/28/2019 1421   EOSABS 0.2 11/28/2019 1421   BASOSABS 0.1 11/28/2019 1421   BMP Latest Ref Rng & Units 11/28/2019 07/23/2019 12/11/2016  Glucose 70 - 99 mg/dL 100(H) 94 104(H)  BUN 6 - 23 mg/dL 16 15 19   Creatinine 0.40 - 1.20 mg/dL 0.74 0.69 0.65  Sodium 135 - 145 mEq/L 136 140 141  Potassium 3.5  - 5.1 mEq/L 4.6 4.4 3.6  Chloride 96 - 112 mEq/L 102 106 107  CO2 19 - 32 mEq/L 26 25 24   Calcium 8.4 - 10.5 mg/dL 10.0 9.1 9.1    Chest imaging:  PFT: No flowsheet data found.  Sleep Study 12/27/19 1. Mild Obstructive Sleep Apnea(OSA) at AHI of 10/h with strong  REM sleep dependence.  2. Oxygen desaturations were brief and intermittent and strongly  REM associated.    RECOMMENDATIONS:   1. Advise auto-titration CPAP device for REM sleep dependent  apnea. Setting between 6 and 16 cm water, 2 cm EPR.   2. Obesity treatment , as lower BMI will help to reduce the  degree of apnea.  Right Lower Extremity Doppler US 11/26/19 RIGHT:  - There is no evidence of deep vein thrombosis in the lower extremity.    - No cystic structure found in the popliteal fossa.    Assessment & Plan:   Mild intermittent asthma with acute exacerbation  Gastroesophageal reflux disease without esophagitis  OSA on CPAP  Dyspnea, unspecified type - Plan: DG Chest 2 View  Discussion: Idamay Hosein is a 40 year old woman, never smoker with hypertension, obesity, mild obstructive sleep apnea on CPAP, anxiety and asthma who is referred to pulmonary clinic for asthma evaluation.   She has on going dyspnea symptoms over recent years along with cough and intermittent wheezing. We will trial her on flovent 132mcg 2 puffs twice daily and monitor for improvement in her symptoms. If she has some benefit but not complete relief then we can try an ICS/LABA inhaler next. If this is the case then we will  check pulmonary function tests.  We will check a chest radiograph due to her shortness of breath.   We will also treat her GERD with omeprazole 40mg  daily. We will try this medication for at last 3 months and monitor her response.   She is followed at [facility] for her sleep apnea.   We discussed the benefits of weight loss which she recognizes.   Follow up in 2 months.   Freda Jackson, MD [facility] Office: [phone]    Current Outpatient Medications:  .  albuterol (VENTOLIN HFA) 108 (90 Base) MCG/ACT inhaler, TAKE 2 PUFFS BY MOUTH EVERY 6 HOURS AS NEEDED FOR WHEEZE OR SHORTNESS OF BREATH, Disp: 19 g, Rfl: 1 .  citalopram (CELEXA) 20 MG tablet, TAKE 1 TABLET BY MOUTH EVERY DAY, Disp: 90 tablet, Rfl: 2 .  fluticasone (FLOVENT HFA) 110 MCG/ACT inhaler, Inhale 2 puffs into the lungs 2 (two) times daily., Disp: 1 each, Rfl: 12 .  gabapentin (NEURONTIN) 300 MG capsule, Take 3 capsules (900 mg total) by mouth 3 (three) times daily., Disp: 810 capsule, Rfl: 3 .  metFORMIN (GLUCOPHAGE-XR) 750 MG 24 hr tablet, TAKE 2 TABLETS (1,500 MG TOTAL) BY MOUTH DAILY WITH BREAKFAST., Disp: 180 tablet, Rfl: 1 .  montelukast (SINGULAIR) 10 MG tablet, TAKE 1 TABLET BY MOUTH EVERYDAY AT BEDTIME, Disp: 90 tablet, Rfl: 1 .  omeprazole (PRILOSEC) 40 MG capsule, Take 1 capsule (40 mg total) by mouth daily., Disp: 30 capsule, Rfl: 6 .  OZEMPIC, 0.25 OR 0.5 MG/DOSE, 2 MG/1.5ML SOPN, INJECT 0.5 MG INTO THE SKIN ONCE A WEEK., Disp: 3 mL, Rfl: 2 .  spironolactone (ALDACTONE) 100 MG tablet, Take 2 tablets (200 mg total) by mouth daily., Disp: 180 tablet, Rfl: 3

## 2020-09-09 ENCOUNTER — Other Ambulatory Visit: Payer: Self-pay | Admitting: *Deleted

## 2020-09-09 ENCOUNTER — Encounter: Payer: Self-pay | Admitting: Family Medicine

## 2020-09-09 ENCOUNTER — Ambulatory Visit: Payer: BC Managed Care – PPO | Admitting: Adult Health

## 2020-09-09 ENCOUNTER — Encounter: Payer: Self-pay | Admitting: Adult Health

## 2020-09-09 VITALS — BP 136/85 | HR 79 | Ht 63.0 in | Wt 277.0 lb

## 2020-09-09 DIAGNOSIS — Z9989 Dependence on other enabling machines and devices: Secondary | ICD-10-CM | POA: Diagnosis not present

## 2020-09-09 DIAGNOSIS — G4733 Obstructive sleep apnea (adult) (pediatric): Secondary | ICD-10-CM

## 2020-09-09 MED ORDER — OZEMPIC (0.25 OR 0.5 MG/DOSE) 2 MG/1.5ML ~~LOC~~ SOPN: 0.5000 mg | PEN_INJECTOR | SUBCUTANEOUS | 1 refills | Status: DC

## 2020-09-09 NOTE — Progress Notes (Signed)
PATIENT: Debra Miller DOB: October 26, 1980  REASON FOR VISIT: follow up HISTORY FROM: patient  HISTORY OF PRESENT ILLNESS: Today 09/09/20:  Debra Miller is a 40 year old female with a history of obstructive sleep apnea on CPAP.  She returns today for follow-up.  She reports that she stopped using the CPAP in December.  States that she feels like she is choking.  However she has a sensation even when she is not using the CPAP.  She has a multitude of other complaints including numbness in the arms and legs, back pain and headaches.  She is seeing Dr. Georgina Snell with sports medicine.  She did have nerve conduction with EMG with Dr. Jannifer Franklin that was unremarkable.  The patient reports that she is frustrated there is been no answer for her symptoms.  HISTORY 03/12/20:  Debra Miller is a 40 year old female with a history of obstructive sleep apnea on CPAP.  Her download indicates that she used her machine nightly for compliance of 100%.  She used her machine greater than 4 hours 25 days for compliance of 83%.  On average she uses her machine 5 hours and 20 minutes.  Her residual AHI is 0.3 on 6 to 16 cm of water with EPR of 2.  Leak in the 95th percentile is 3.1.  She reports that the CPAP is working well for her.  She still feels tired and sleepy during the day but reports that she may see a slight improvement.  REVIEW OF SYSTEMS: Out of a complete 14 system review of symptoms, the patient complains only of the following symptoms, and all other reviewed systems are negative.  Fatigue severity score 49 Epworth sleepiness score 7  ALLERGIES: Allergies  Allergen Reactions  . Ceclor [Cefaclor] Hives  . Pediapred [Prednisolone Sodium Phosphate] Hives    HOME MEDICATIONS: Outpatient Medications Prior to Visit  Medication Sig Dispense Refill  . albuterol (VENTOLIN HFA) 108 (90 Base) MCG/ACT inhaler TAKE 2 PUFFS BY MOUTH EVERY 6 HOURS AS NEEDED FOR WHEEZE OR SHORTNESS OF BREATH 19 g 1  . citalopram (CELEXA)  20 MG tablet TAKE 1 TABLET BY MOUTH EVERY DAY 90 tablet 2  . fluticasone (FLOVENT HFA) 110 MCG/ACT inhaler Inhale 2 puffs into the lungs 2 (two) times daily. 1 each 12  . metFORMIN (GLUCOPHAGE-XR) 750 MG 24 hr tablet TAKE 2 TABLETS (1,500 MG TOTAL) BY MOUTH DAILY WITH BREAKFAST. 180 tablet 1  . montelukast (SINGULAIR) 10 MG tablet TAKE 1 TABLET BY MOUTH EVERYDAY AT BEDTIME 90 tablet 1  . omeprazole (PRILOSEC) 40 MG capsule Take 1 capsule (40 mg total) by mouth daily. 30 capsule 6  . OZEMPIC, 0.25 OR 0.5 MG/DOSE, 2 MG/1.5ML SOPN INJECT 0.5 MG INTO THE SKIN ONCE A WEEK. 3 mL 2  . spironolactone (ALDACTONE) 100 MG tablet Take 2 tablets (200 mg total) by mouth daily. 180 tablet 3  . gabapentin (NEURONTIN) 300 MG capsule Take 3 capsules (900 mg total) by mouth 3 (three) times daily. 810 capsule 3   No facility-administered medications prior to visit.    PAST MEDICAL HISTORY: Past Medical History:  Diagnosis Date  . Asthma   . Atypical mole   . Bipolar disorder ([place])   . Chicken pox   . 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 Depression   . Dizziness   . Dysmenorrhea   . Eczema   . Elevated triglycerides with high cholesterol   . Frequent headaches   . GERD (gastroesophageal  reflux disease)   . H/O cold sores   . High cholesterol   . Hypertension    high blood pressure readings   . Migraines    with aura  . Obesity   . Seasonal allergies   . Urine incontinence   . UTI (urinary tract infection)     PAST SURGICAL HISTORY: Past Surgical History:  Procedure Laterality Date  . KNEE SURGERY    . TONSILLECTOMY AND ADENOIDECTOMY    . TUBAL LIGATION      FAMILY HISTORY: Family History  Problem Relation Age of Onset  . Arthritis Mother   . High Cholesterol Mother   . Hypertension Mother   . Diabetes Mother   . Rheum arthritis Mother   . Asthma Mother   . Hyperlipidemia Mother   . Migraines Mother   . Thyroid disease Mother   .  Arthritis Maternal Grandmother   . Breast cancer Maternal Grandmother   . High Cholesterol Maternal Grandmother   . Stroke Maternal Grandmother   . Hypertension Maternal Grandmother   . Migraines Maternal Grandmother     SOCIAL HISTORY: Social History   Socioeconomic History  . Marital status: Married    Spouse name: Not on file  . Number of children: Not on file  . Years of education: Not on file  . Highest education level: Not on file  Occupational History  . Not on file  Tobacco Use  . Smoking status: Never Smoker  . Smokeless tobacco: Never Used  Vaping Use  . Vaping Use: Never used  Substance and Sexual Activity  . Alcohol use: No    Alcohol/week: 0.0 standard drinks  . Drug use: No  . Sexual activity: Yes    Partners: Male    Birth control/protection: Surgical    Comment: Tubal  Other Topics Concern  . Not on file  Social History Narrative   Work or School: homemaker      Home Situation: lives with husband and 5 kids (ages 64-14 in 2014)      Spiritual Beliefs: Christian      Lifestyle: no regular exercise, poor diet, lots of soda            Social Determinants of Health   Financial Resource Strain: Not on file  Food Insecurity: Not on file  Transportation Needs: Not on file  Physical Activity: Not on file  Stress: Not on file  Social Connections: Not on file  Intimate Partner Violence: Not on file      PHYSICAL EXAM  Vitals:   09/09/20 1135  BP: 136/85  Pulse: 79  Weight: 277 lb (125.6 kg)  Height: 5\' 3"  (1.6 m)   Body mass index is 49.07 kg/m.  Generalized: Well developed, in no acute distress  Chest: Lungs clear to auscultation bilaterally  Neurological examination  Mentation: Alert oriented to time, place, history taking. Follows all commands speech and language fluent Cranial nerve II-XII: Extraocular movements were full, visual field were full on confrontational test Head turning and shoulder shrug  were normal and  symmetric. Motor: The motor testing reveals 5 over 5 strength of all 4 extremities. Good symmetric motor tone is noted throughout.  Sensory: Sensory testing is intact to soft touch on all 4 extremities. No evidence of extinction is noted.  Gait and station: Gait is normal.     DIAGNOSTIC DATA (LABS, IMAGING, TESTING) - I reviewed patient records, labs, notes, testing and imaging myself where available.  Lab Results  Component Value Date  WBC 9.1 11/28/2019   HGB 14.5 11/28/2019   HCT 42.7 11/28/2019   MCV 88.0 11/28/2019   PLT 253.0 11/28/2019      Component Value Date/Time   NA 136 11/28/2019 1421   K 4.6 11/28/2019 1421   CL 102 11/28/2019 1421   CO2 26 11/28/2019 1421   GLUCOSE 100 (H) 11/28/2019 1421   BUN 16 11/28/2019 1421   CREATININE 0.74 11/28/2019 1421   CALCIUM 10.0 11/28/2019 1421   PROT 7.4 11/28/2019 1421   ALBUMIN 5.0 11/28/2019 1421   AST 25 11/28/2019 1421   ALT 38 (H) 11/28/2019 1421   ALKPHOS 70 11/28/2019 1421   BILITOT 0.6 11/28/2019 1421   GFRNONAA >60 12/11/2016 0358   GFRAA >60 12/11/2016 0358   Lab Results  Component Value Date   CHOL 209 (H) 07/23/2019   HDL 39.10 07/23/2019   LDLDIRECT 126.0 07/23/2019   TRIG 319.0 (H) 07/23/2019   CHOLHDL 5 07/23/2019   Lab Results  Component Value Date   HGBA1C 5.3 07/23/2019   Lab Results  Component Value Date   VITAMINB12 468 11/28/2019   Lab Results  Component Value Date   TSH 2.14 07/23/2019      ASSESSMENT AND PLAN 40 y.o. year old female  has a past medical history of Asthma, Atypical mole, Bipolar disorder ([place]), Chicken pox, CIN III (cervical intraepithelial neoplasia grade III) with severe dysplasia (10/2004), Depression, Dizziness, Dysmenorrhea, Eczema, Elevated triglycerides with high cholesterol, Frequent headaches, GERD (gastroesophageal reflux disease), H/O cold sores, High cholesterol, Hypertension, Migraines, Obesity, Seasonal allergies, Urine incontinence, and UTI (urinary tract  infection). here with:  1.  Obstructive sleep apnea on CPAP  --Encouraged patient to restart CPAP therapy. --Advised that we could increase her pressure if she felt that it was not stronger. --Also advised that untreated sleep apnea may be contributing to her daily headaches.  In regards to her other symptoms including numbness in the extremities and back pain.  Advised that Dr. Georgina Snell felt that she needed a neurologic evaluation that he consider referral to our office.  Dr. Jannifer Franklin performed her nerve conduction study/EMG   I spent 20 minutes of face-to-face and non-face-to-face time with patient.  This included previsit chart review, lab review, study review, order entry, electronic health record documentation, patient education.  Ward Givens, MSN, NP-C 09/09/2020, 11:41 AM [facility] [address] [place], [place] [address] [phone]

## 2020-09-09 NOTE — Patient Instructions (Signed)
Continue using CPAP nightly and greater than 4 hours each night °If your symptoms worsen or you develop new symptoms please let us know.  ° °

## 2020-09-19 ENCOUNTER — Other Ambulatory Visit (HOSPITAL_COMMUNITY)
Admission: RE | Admit: 2020-09-19 | Discharge: 2020-09-19 | Disposition: A | Discharge status: Home / Self Care | Payer: BC Managed Care – PPO | Source: Ambulatory Visit | Attending: Family Medicine | Admitting: Family Medicine

## 2020-09-19 DIAGNOSIS — Z01812 Encounter for preprocedural laboratory examination: Secondary | ICD-10-CM | POA: Insufficient documentation

## 2020-09-19 DIAGNOSIS — R930 Abnormal findings on diagnostic imaging of skull and head, not elsewhere classified: Secondary | ICD-10-CM | POA: Diagnosis not present

## 2020-09-19 DIAGNOSIS — R202 Paresthesia of skin: Secondary | ICD-10-CM | POA: Diagnosis not present

## 2020-09-19 DIAGNOSIS — Z20822 Contact with and (suspected) exposure to covid-19: Secondary | ICD-10-CM | POA: Insufficient documentation

## 2020-09-19 DIAGNOSIS — M50223 Other cervical disc displacement at C6-C7 level: Secondary | ICD-10-CM | POA: Diagnosis not present

## 2020-09-19 LAB — SARS CORONAVIRUS 2 (TAT 6-24 HRS): SARS Coronavirus 2: NEGATIVE

## 2020-09-21 ENCOUNTER — Encounter (HOSPITAL_COMMUNITY): Payer: Self-pay | Admitting: *Deleted

## 2020-09-21 NOTE — Progress Notes (Signed)
Debra Miller denies chest pain or shortness of breath. Patient was tested for Covid and has been in quarantine since that time.

## 2020-09-22 ENCOUNTER — Encounter (HOSPITAL_COMMUNITY): Admission: RE | Disposition: A | Discharge status: Home / Self Care | Payer: Self-pay | Source: Ambulatory Visit

## 2020-09-22 ENCOUNTER — Encounter (HOSPITAL_COMMUNITY): Payer: Self-pay

## 2020-09-22 ENCOUNTER — Ambulatory Visit (HOSPITAL_COMMUNITY)
Admission: RE | Admit: 2020-09-22 | Discharge: 2020-09-22 | Disposition: A | Discharge status: Home / Self Care | Payer: BC Managed Care – PPO | Source: Ambulatory Visit | Attending: Family Medicine | Admitting: Family Medicine

## 2020-09-22 ENCOUNTER — Ambulatory Visit (HOSPITAL_COMMUNITY): Payer: BC Managed Care – PPO | Admitting: Certified Registered"

## 2020-09-22 ENCOUNTER — Other Ambulatory Visit: Payer: Self-pay

## 2020-09-22 DIAGNOSIS — M50223 Other cervical disc displacement at C6-C7 level: Secondary | ICD-10-CM | POA: Insufficient documentation

## 2020-09-22 DIAGNOSIS — R930 Abnormal findings on diagnostic imaging of skull and head, not elsewhere classified: Secondary | ICD-10-CM | POA: Insufficient documentation

## 2020-09-22 DIAGNOSIS — Z20822 Contact with and (suspected) exposure to covid-19: Secondary | ICD-10-CM | POA: Insufficient documentation

## 2020-09-22 DIAGNOSIS — G959 Disease of spinal cord, unspecified: Secondary | ICD-10-CM

## 2020-09-22 DIAGNOSIS — R202 Paresthesia of skin: Secondary | ICD-10-CM | POA: Diagnosis not present

## 2020-09-22 HISTORY — PX: RADIOLOGY WITH ANESTHESIA: SHX6223

## 2020-09-22 HISTORY — DX: Anxiety disorder, unspecified: F41.9

## 2020-09-22 HISTORY — DX: Type 2 diabetes mellitus without complications: E11.9

## 2020-09-22 HISTORY — DX: Polycystic ovarian syndrome: E28.2

## 2020-09-22 LAB — GLUCOSE, CAPILLARY
Glucose-Capillary: 105 mg/dL — ABNORMAL HIGH (ref 70–99)
Glucose-Capillary: 126 mg/dL — ABNORMAL HIGH (ref 70–99)

## 2020-09-22 LAB — POCT PREGNANCY, URINE: Preg Test, Ur: NEGATIVE

## 2020-09-22 SURGERY — MRI WITH ANESTHESIA
Anesthesia: General

## 2020-09-22 MED ORDER — ACETAMINOPHEN 160 MG/5ML PO SOLN: 1000.0000 mg | Freq: Once | ORAL | Status: DC | PRN

## 2020-09-22 MED ORDER — ONDANSETRON HCL 4 MG/2ML IJ SOLN
INTRAMUSCULAR | Status: DC | PRN
  Administered 2020-09-22: 4 mg via INTRAVENOUS

## 2020-09-22 MED ORDER — SUGAMMADEX SODIUM 200 MG/2ML IV SOLN
INTRAVENOUS | Status: DC | PRN
  Administered 2020-09-22: 300 mg via INTRAVENOUS

## 2020-09-22 MED ORDER — FENTANYL CITRATE (PF) 250 MCG/5ML IJ SOLN
INTRAMUSCULAR | Status: DC | PRN
  Administered 2020-09-22 (×2): 50 ug via INTRAVENOUS

## 2020-09-22 MED ORDER — ACETAMINOPHEN 500 MG PO TABS: 1000.0000 mg | ORAL_TABLET | Freq: Once | ORAL | Status: DC | PRN

## 2020-09-22 MED ORDER — PROPOFOL 10 MG/ML IV BOLUS
INTRAVENOUS | Status: DC | PRN
  Administered 2020-09-22: 200 mg via INTRAVENOUS

## 2020-09-22 MED ORDER — FENTANYL CITRATE (PF) 100 MCG/2ML IJ SOLN
25.0000 ug | INTRAMUSCULAR | Status: DC | PRN
Start: 2020-09-22 — End: 2020-09-22

## 2020-09-22 MED ORDER — CHLORHEXIDINE GLUCONATE 0.12 % MT SOLN
15.0000 mL | Freq: Once | OROMUCOSAL | Status: AC
  Administered 2020-09-22: 15 mL via OROMUCOSAL
  Filled 2020-09-22: qty 15

## 2020-09-22 MED ORDER — OXYCODONE HCL 5 MG PO TABS: 5.0000 mg | ORAL_TABLET | Freq: Once | ORAL | Status: DC | PRN

## 2020-09-22 MED ORDER — LACTATED RINGERS IV SOLN: INTRAVENOUS | Status: DC

## 2020-09-22 MED ORDER — ORAL CARE MOUTH RINSE: 15.0000 mL | Freq: Once | OROMUCOSAL | Status: AC

## 2020-09-22 MED ORDER — DEXAMETHASONE SODIUM PHOSPHATE 10 MG/ML IJ SOLN
INTRAMUSCULAR | Status: DC | PRN
  Administered 2020-09-22: 10 mg via INTRAVENOUS

## 2020-09-22 MED ORDER — LIDOCAINE 2% (20 MG/ML) 5 ML SYRINGE
INTRAMUSCULAR | Status: DC | PRN
  Administered 2020-09-22: 60 mg via INTRAVENOUS

## 2020-09-22 MED ORDER — ACETAMINOPHEN 10 MG/ML IV SOLN
1000.0000 mg | Freq: Once | INTRAVENOUS | Status: DC | PRN
Start: 2020-09-22 — End: 2020-09-22

## 2020-09-22 MED ORDER — MIDAZOLAM HCL 2 MG/2ML IJ SOLN
INTRAMUSCULAR | Status: DC | PRN
  Administered 2020-09-22: 2 mg via INTRAVENOUS

## 2020-09-22 MED ORDER — OXYCODONE HCL 5 MG/5ML PO SOLN: 5.0000 mg | Freq: Once | ORAL | Status: DC | PRN

## 2020-09-22 MED ORDER — ROCURONIUM BROMIDE 10 MG/ML (PF) SYRINGE
PREFILLED_SYRINGE | INTRAVENOUS | Status: DC | PRN
  Administered 2020-09-22: 80 mg via INTRAVENOUS
  Administered 2020-09-22: 20 mg via INTRAVENOUS

## 2020-09-22 NOTE — Anesthesia Preprocedure Evaluation (Addendum)
Anesthesia Evaluation  Patient identified by MRN, date of birth, ID band Patient awake    Reviewed: Allergy & Precautions, NPO status , Patient's Chart, lab work & pertinent test results  History of Anesthesia Complications Negative for: history of anesthetic complications  Airway Mallampati: I  TM Distance: >3 FB Neck ROM: Full    Dental  (+) Dental Advisory Given, Teeth Intact   Pulmonary neg shortness of breath, asthma , sleep apnea and Continuous Positive Airway Pressure Ventilation , neg recent URI, neg PE   breath sounds clear to auscultation       Cardiovascular hypertension,  Rhythm:Regular     Neuro/Psych  Headaches, Anxiety Depression Bipolar Disorder  Neuromuscular disease    GI/Hepatic Neg liver ROS, GERD  ,  Endo/Other  diabetesMorbid obesity  Renal/GU negative Renal ROS     Musculoskeletal negative musculoskeletal ROS (+)   Abdominal   Peds  Hematology negative hematology ROS (+)   Anesthesia Other Findings   Reproductive/Obstetrics Lab Results      Component                Value               Date                      PREGTESTUR               NEGATIVE            09/22/2020                                       Anesthesia Physical Anesthesia Plan  ASA: III  Anesthesia Plan: General   Post-op Pain Management:    Induction: Intravenous  PONV Risk Score and Plan: 3 and Ondansetron and Dexamethasone  Airway Management Planned: LMA and Oral ETT  Additional Equipment: None  Intra-op Plan:   Post-operative Plan: Extubation in OR  Informed Consent: I have reviewed the patients History and Physical, chart, labs and discussed the procedure including the risks, benefits and alternatives for the proposed anesthesia with the patient or authorized representative who has indicated his/her understanding and acceptance.     Dental advisory given  Plan Discussed with:  CRNA  Anesthesia Plan Comments:         Anesthesia Quick Evaluation

## 2020-09-22 NOTE — Transfer of Care (Signed)
Immediate Anesthesia Transfer of Care Note  Patient: Debra Miller  Procedure(s) Performed: MRI WITH ANESTHESIA CERVICAL SPINE WITHOUT AND BRAIN WITHOUT (N/A )  Patient Location: PACU  Anesthesia Type:General  Level of Consciousness: drowsy and patient cooperative  Airway & Oxygen Therapy: Patient Spontanous Breathing  Post-op Assessment: Report given to RN and Post -op Vital signs reviewed and stable  Post vital signs: Reviewed and stable  Last Vitals:  Vitals Value Taken Time  BP 126/89 09/22/20 1034  Temp    Pulse 87 09/22/20 1035  Resp 21 09/22/20 1035  SpO2 91 % 09/22/20 1035  Vitals shown include unvalidated device data.  Last Pain:  Vitals:   09/22/20 0738  TempSrc:   PainSc: 0-No pain      Patients Stated Pain Goal: 3 (81/59/47 0761)  Complications: No complications documented.

## 2020-09-22 NOTE — Anesthesia Procedure Notes (Signed)
Procedure Name: LMA Insertion Date/Time: 09/22/2020 9:25 AM Performed by: Lance Coon, CRNA Pre-anesthesia Checklist: Patient identified, Emergency Drugs available, Suction available, Patient being monitored and Timeout performed Patient Re-evaluated:Patient Re-evaluated prior to induction Oxygen Delivery Method: Circle system utilized Preoxygenation: Pre-oxygenation with 100% oxygen Induction Type: IV induction Ventilation: Mask ventilation without difficulty Laryngoscope Size: Miller and 3 Grade View: Grade I Tube type: Oral Tube size: 7.0 mm Number of attempts: 1 Airway Equipment and Method: Stylet Placement Confirmation: ETT inserted through vocal cords under direct vision,  positive ETCO2 and breath sounds checked- equal and bilateral Secured at: 21 cm Tube secured with: Tape Dental Injury: Teeth and Oropharynx as per pre-operative assessment

## 2020-09-23 ENCOUNTER — Encounter (HOSPITAL_COMMUNITY): Payer: Self-pay | Admitting: Radiology

## 2020-09-23 NOTE — Anesthesia Postprocedure Evaluation (Signed)
Anesthesia Post Note  Patient: Debra Miller  Procedure(s) Performed: MRI WITH ANESTHESIA CERVICAL SPINE WITHOUT AND BRAIN WITHOUT (N/A )     Patient location during evaluation: PACU Anesthesia Type: General Level of consciousness: awake and alert Pain management: pain level controlled Vital Signs Assessment: post-procedure vital signs reviewed and stable Respiratory status: spontaneous breathing, nonlabored ventilation, respiratory function stable and patient connected to nasal cannula oxygen Cardiovascular status: blood pressure returned to baseline and stable Postop Assessment: no apparent nausea or vomiting Anesthetic complications: no   No complications documented.  Last Vitals:  Vitals:   09/22/20 1050 09/22/20 1105  BP: 121/87 125/83  Pulse: 81 85  Resp: 14 20  Temp:  (!) 36.1 C  SpO2: 92% 93%    Last Pain:  Vitals:   09/22/20 1050  TempSrc:   PainSc: Asleep                 Nielle Duford

## 2020-09-23 NOTE — Progress Notes (Unsigned)
I, Wendy Poet, LAT, ATC, am serving as scribe for Dr. Lynne Leader.  Debra Miller is a 40 y.o. female who presents to [facility] at [facility] today for f/u of LBP and B UE/LE pain and paresthesias and C-spine and brain MRI review.  She was last seen by Dr. Georgina Snell on 02/06/20 after having had a R LE EMG/NCV test on 02/04/20.  Since her last visit, pt reports that her symptoms have con't to worsen in terms of her back pain/spams and the numbness in her B UEs and LEs.  She states that she has numbness in her entire arm and leg, bilaterally.  She reports dropping things and tripping frequently.  She notes difficulty w/ swallowing and chest tighhtness.   Diagnostic imaging: C-spine and brain MRI- 09/22/20; L-spine XR- 12/05/19  Pertinent review of systems: No fevers or chills  Relevant historical information: Bipolar disorder   Exam:  BP 124/84 (BP Location: Right Arm, Patient Position: Sitting, Cuff Size: Large)   Pulse 69   Ht 5\' 3"  (1.6 m)   Wt 281 lb 12.8 oz (127.8 kg)   LMP 09/17/2020 (Exact Date)   SpO2 97%   BMI 49.92 kg/m  General: Well Developed, well nourished, and in no acute distress.   MSK: Normal extremity motion and coordination    Lab and Radiology Results Results for orders placed or performed during the hospital encounter of 09/22/20 (from the past 72 hour(s))  Glucose, capillary     Status: Abnormal   Collection Time: 09/22/20 10:34 AM  Result Value Ref Range   Glucose-Capillary 126 (H) 70 - 99 mg/dL    Comment: Glucose reference range applies only to samples taken after fasting for at least 8 hours.   MR BRAIN WO CONTRAST  Result Date: 09/22/2020 CLINICAL DATA:  Myelopathy, acute or progressive; Numbness or tingling, paresthesia (Ped 0-18y) EXAM: MRI HEAD WITHOUT CONTRAST MRI CERVICAL SPINE WITHOUT CONTRAST TECHNIQUE: Multiplanar, multiecho pulse sequences of the brain and surrounding structures, and cervical spine, to include the craniocervical  junction and cervicothoracic junction, were obtained without intravenous contrast. COMPARISON:  07/09/2015 and prior. FINDINGS: MRI HEAD FINDINGS Brain: No diffusion-weighted signal abnormality. No intracranial hemorrhage. No midline shift, ventriculomegaly or extra-axial fluid collection. No mass lesion. Normal cerebral volume. Scattered subcortical T2 hyperintense foci involving the left cerebrum. Partially CSF filled sella turcica, nonspecific. Vascular: Normal flow voids. Skull: Normal marrow signal. Sinuses/Orbits: Normal orbits. Clear paranasal sinuses. No mastoid effusion. Other: None. MRI CERVICAL SPINE FINDINGS Alignment: Straightening of lordosis. Vertebrae: Normal bone marrow signal intensity. No fracture or aggressive osseous lesion. Cord: Normal signal and morphology.  No focal cord lesion. Posterior Fossa, vertebral arteries: Negative. Disc levels: Mild desiccation. C2-3: No significant disc bulge. Patent spinal canal and neural foramen. C3-4: No significant disc bulge. Patent spinal canal and neural foramen. C4-5: No significant disc bulge. Patent spinal canal and neural foramen. C5-6: Shallow right paracentral protrusion. Patent spinal canal and neural foramen. C6-7: Shallow left paracentral anterior protrusion (4:9) abutting the proximal esophagus. Patent spinal canal and neural foramen. C7-T1: No significant disc bulge. Patent spinal canal and neural foramen. Paraspinal tissues: Negative. IMPRESSION: MRI brain: Left cerebral subcortical white matter hyperintensities are nonspecific. Differential includes chronic migraines, post infectious/inflammatory sequela, early chronic microvascular ischemic changes and demyelination. MRI cervical spine: Small anterior left paracentral protrusion at the C6-7 level abutting the proximal esophagus. No significant spinal canal or neural foraminal narrowing. No cord lesions. Electronically Signed   By: Milus Mallick.D.  On: 09/22/2020 14:56   MR  CERVICAL SPINE WO CONTRAST  Result Date: 09/22/2020 CLINICAL DATA:  Myelopathy, acute or progressive; Numbness or tingling, paresthesia (Ped 0-18y) EXAM: MRI HEAD WITHOUT CONTRAST MRI CERVICAL SPINE WITHOUT CONTRAST TECHNIQUE: Multiplanar, multiecho pulse sequences of the brain and surrounding structures, and cervical spine, to include the craniocervical junction and cervicothoracic junction, were obtained without intravenous contrast. COMPARISON:  07/09/2015 and prior. FINDINGS: MRI HEAD FINDINGS Brain: No diffusion-weighted signal abnormality. No intracranial hemorrhage. No midline shift, ventriculomegaly or extra-axial fluid collection. No mass lesion. Normal cerebral volume. Scattered subcortical T2 hyperintense foci involving the left cerebrum. Partially CSF filled sella turcica, nonspecific. Vascular: Normal flow voids. Skull: Normal marrow signal. Sinuses/Orbits: Normal orbits. Clear paranasal sinuses. No mastoid effusion. Other: None. MRI CERVICAL SPINE FINDINGS Alignment: Straightening of lordosis. Vertebrae: Normal bone marrow signal intensity. No fracture or aggressive osseous lesion. Cord: Normal signal and morphology.  No focal cord lesion. Posterior Fossa, vertebral arteries: Negative. Disc levels: Mild desiccation. C2-3: No significant disc bulge. Patent spinal canal and neural foramen. C3-4: No significant disc bulge. Patent spinal canal and neural foramen. C4-5: No significant disc bulge. Patent spinal canal and neural foramen. C5-6: Shallow right paracentral protrusion. Patent spinal canal and neural foramen. C6-7: Shallow left paracentral anterior protrusion (4:9) abutting the proximal esophagus. Patent spinal canal and neural foramen. C7-T1: No significant disc bulge. Patent spinal canal and neural foramen. Paraspinal tissues: Negative. IMPRESSION: MRI brain: Left cerebral subcortical white matter hyperintensities are nonspecific. Differential includes chronic migraines, post  infectious/inflammatory sequela, early chronic microvascular ischemic changes and demyelination. MRI cervical spine: Small anterior left paracentral protrusion at the C6-7 level abutting the proximal esophagus. No significant spinal canal or neural foraminal narrowing. No cord lesions. Electronically Signed   By: Primitivo Gauze M.D.   On: 09/22/2020 14:56   I, Lynne Leader, personally (independently) visualized and performed the interpretation of the images attached in this note.      Assessment and Plan: 40 y.o. female with paresthesia.  Patient does have some white matter changes on MRI.  Her nerve conduction study was normal.  At this point the only significant abnormal finding in her testing is the white matter changes on MRI.  Is possible paresthesias are due to complex migraines or even MS although this is less likely based on MRI changes.  Plan for referral to neurology for further evaluation and management.   PDMP not reviewed this encounter. Orders Placed This Encounter  Procedures  . Ambulatory referral to Neurology    Referral Priority:   Routine    Referral Type:   Consultation    Referral Reason:   Specialty Services Required    Requested Specialty:   Neurology    Number of Visits Requested:   1   No orders of the defined types were placed in this encounter.    Discussed warning signs or symptoms. Please see discharge instructions. Patient expresses understanding.   The above documentation has been reviewed and is accurate and complete Lynne Leader, M.D.  Total encounter time 20 minutes including face-to-face time with the patient and, reviewing past medical record, and charting on the date of service.  MRi findings.

## 2020-09-23 NOTE — Progress Notes (Signed)
MRI cervical spine does have a small bulging disc that abuts the esophagus but does not present any nerves.  The cervical spine looks normal.

## 2020-09-23 NOTE — Progress Notes (Signed)
MRI brain shows some left subcortical white matter hyperintensities that are nonspecific.  Radiology report notes that this could be due to migraines postinfectious or inflammatory sequelae changes from hypertension or demyelination changes.  They do not mention multiple sclerosis which makes me think this is probably not multiple sclerosis.  However this may benefit from neurology follow-up.  Would you like a referral?  It has been quite a while since we have seen each other.  I recommend that we have a follow-up appointment in clinic to go over the results of the brain and cervical spine MRI and discuss next steps.

## 2020-09-24 ENCOUNTER — Other Ambulatory Visit: Payer: Self-pay

## 2020-09-24 ENCOUNTER — Encounter: Payer: Self-pay | Admitting: Family Medicine

## 2020-09-24 ENCOUNTER — Ambulatory Visit: Payer: BC Managed Care – PPO | Admitting: Family Medicine

## 2020-09-24 VITALS — BP 124/84 | HR 69 | Ht 63.0 in | Wt 281.8 lb

## 2020-09-24 DIAGNOSIS — R202 Paresthesia of skin: Secondary | ICD-10-CM

## 2020-09-24 NOTE — Patient Instructions (Signed)
Thank you for coming in today.  Plan for neurology referral.

## 2020-10-02 ENCOUNTER — Encounter: Payer: Self-pay | Admitting: Family Medicine

## 2020-10-02 DIAGNOSIS — R202 Paresthesia of skin: Secondary | ICD-10-CM

## 2020-10-02 DIAGNOSIS — R9082 White matter disease, unspecified: Secondary | ICD-10-CM

## 2020-10-08 ENCOUNTER — Encounter: Payer: Self-pay | Admitting: Family Medicine

## 2020-10-08 ENCOUNTER — Ambulatory Visit (INDEPENDENT_AMBULATORY_CARE_PROVIDER_SITE_OTHER): Payer: BC Managed Care – PPO | Admitting: Family Medicine

## 2020-10-08 ENCOUNTER — Other Ambulatory Visit: Payer: Self-pay

## 2020-10-08 VITALS — BP 136/84 | HR 71 | Temp 98.4°F | Ht 63.5 in | Wt 286.4 lb

## 2020-10-08 DIAGNOSIS — F419 Anxiety disorder, unspecified: Secondary | ICD-10-CM | POA: Diagnosis not present

## 2020-10-08 DIAGNOSIS — Z13220 Encounter for screening for lipoid disorders: Secondary | ICD-10-CM

## 2020-10-08 DIAGNOSIS — E88819 Insulin resistance, unspecified: Secondary | ICD-10-CM

## 2020-10-08 DIAGNOSIS — I10 Essential (primary) hypertension: Secondary | ICD-10-CM | POA: Diagnosis not present

## 2020-10-08 DIAGNOSIS — Z6841 Body Mass Index (BMI) 40.0 and over, adult: Secondary | ICD-10-CM | POA: Diagnosis not present

## 2020-10-08 DIAGNOSIS — E66813 Obesity, class 3: Secondary | ICD-10-CM

## 2020-10-08 DIAGNOSIS — E8881 Metabolic syndrome: Secondary | ICD-10-CM | POA: Diagnosis not present

## 2020-10-08 DIAGNOSIS — J45909 Unspecified asthma, uncomplicated: Secondary | ICD-10-CM

## 2020-10-08 DIAGNOSIS — R202 Paresthesia of skin: Secondary | ICD-10-CM

## 2020-10-08 DIAGNOSIS — R1310 Dysphagia, unspecified: Secondary | ICD-10-CM

## 2020-10-08 LAB — COMPREHENSIVE METABOLIC PANEL
ALT: 18 U/L (ref 0–35)
AST: 16 U/L (ref 0–37)
Albumin: 4.1 g/dL (ref 3.5–5.2)
Alkaline Phosphatase: 61 U/L (ref 39–117)
BUN: 14 mg/dL (ref 6–23)
CO2: 26 mEq/L (ref 19–32)
Calcium: 9 mg/dL (ref 8.4–10.5)
Chloride: 103 mEq/L (ref 96–112)
Creatinine, Ser: 0.7 mg/dL (ref 0.40–1.20)
GFR: 109.75 mL/min (ref >60.00)
Glucose, Bld: 95 mg/dL (ref 70–99)
Potassium: 4 mEq/L (ref 3.5–5.1)
Sodium: 138 mEq/L (ref 135–145)
Total Bilirubin: 0.5 mg/dL (ref 0.2–1.2)
Total Protein: 6.5 g/dL (ref 6.0–8.3)

## 2020-10-08 LAB — CBC
HCT: 39.3 % (ref 36.0–46.0)
Hemoglobin: 13.3 g/dL (ref 12.0–15.0)
MCHC: 33.7 g/dL (ref 30.0–36.0)
MCV: 86.9 fl (ref 78.0–100.0)
Platelets: 221 10*3/uL (ref 150.0–400.0)
RBC: 4.52 Mil/uL (ref 3.87–5.11)
RDW: 13.7 % (ref 11.5–15.5)
WBC: 8.4 10*3/uL (ref 4.0–10.5)

## 2020-10-08 LAB — LIPID PANEL
Cholesterol: 200 mg/dL (ref 0–200)
HDL: 41.1 mg/dL (ref >39.00)
Total CHOL/HDL Ratio: 5
Triglycerides: 521 mg/dL — ABNORMAL HIGH (ref 0.0–149.0)

## 2020-10-08 LAB — TSH: TSH: 3.1 u[IU]/mL (ref 0.35–4.50)

## 2020-10-08 LAB — LDL CHOLESTEROL, DIRECT: Direct LDL: 102 mg/dL

## 2020-10-08 MED ORDER — BUSPIRONE HCL 5 MG PO TABS: 5.0000 mg | ORAL_TABLET | Freq: Two times a day (BID) | ORAL | 5 refills | Status: DC

## 2020-10-08 MED ORDER — OZEMPIC (1 MG/DOSE) 4 MG/3ML ~~LOC~~ SOPN: 1.0000 mg | PEN_INJECTOR | SUBCUTANEOUS | 3 refills | Status: DC

## 2020-10-08 MED ORDER — CITALOPRAM HYDROBROMIDE 40 MG PO TABS: 40.0000 mg | ORAL_TABLET | Freq: Every day | ORAL | 3 refills | Status: DC

## 2020-10-08 NOTE — Patient Instructions (Signed)
It was very nice to see you today!  We will increase your Ozempic to 1 mg weekly.  We will add on BuSpar 5 mg twice daily.  Please send me a message in a few weeks to let me know how both of these changes are working for you.  We will check blood work today.  I will also place a referral for you to see a bariatric specialist and a GI doctor.  I will see back in 3 months.  Come back to see me sooner if needed.  Take care, Dr [name]  PLEASE NOTE:  If you had any lab tests please let us know if you have not heard back within a few days. You may see your results on mychart before we have a chance to review them but we will give you a call once they are reviewed by [place]. If we ordered any referrals today, please let us know if you have not heard from their office within the next week.   Please try these tips to maintain a healthy lifestyle:   Eat at least 3 REAL meals and 1-2 snacks per day.  Aim for no more than 5 hours between eating.  If you eat breakfast, please do so within one hour of getting up.    Each meal should contain half fruits/vegetables, one quarter protein, and one quarter carbs (no bigger than a computer mouse)   Cut down on sweet beverages. This includes juice, soda, and sweet tea.     Drink at least 1 glass of water with each meal and aim for at least 8 glasses per day   Exercise at least 150 minutes every week.

## 2020-10-08 NOTE — Assessment & Plan Note (Signed)
Symptoms have been progressive.  Will place referral to GI for further evaluation.  Interestingly she was recently found to have a cervical disc protrusion on her recent MRI that abuts her esophagus.  Unclear how much role this is playing.

## 2020-10-08 NOTE — Assessment & Plan Note (Signed)
She is down 2 pounds since our last visit.  We will increase Ozempic to 1 mg weekly.  Will place referral to bariatric surgery as well.  She will follow-up with me in 3 months.

## 2020-10-08 NOTE — Assessment & Plan Note (Signed)
At goal.  Continue spironolactone 200 mg daily.  Check labs today.

## 2020-10-08 NOTE — Progress Notes (Signed)
   Debra Miller is a 40 y.o. female who presents today for an office visit.  Assessment/Plan:  Chronic Problems Addressed Today: Class 3 severe obesity without serious comorbidity with body mass index (BMI) of 45.0 to 49.9 in adult [facility]) She is down 2 pounds since our last visit.  We will increase Ozempic to 1 mg weekly.  Will place referral to bariatric surgery as well.  She will follow-up with me in 3 months.  Anxiety She has had some improvement with Celexa 40 mg daily.  She feels like it is not quite enough.  Discussed switching to alternative SSRI or augmentation.  We will start BuSpar 5 mg twice daily.  She will check with me in a couple weeks via MyChart.  We will titrate dose of BuSpar as needed.  Insulin resistance Check labs.  Will increase the Ozempic dose to 1 mg weekly as above.  Continue Metformin 1500 mg daily.   Essential hypertension At goal.  Continue spironolactone 200 mg daily.  Check labs today.  Asthma Along with pulmonology.  She will be following up with them again soon.  Paresthesias Sports medicine recently ordered MRI which showed nonspecific white matter changes.  She has referral to neurology pending.  Dysphagia Symptoms have been progressive.  Will place referral to GI for further evaluation.  Interestingly she was recently found to have a cervical disc protrusion on her recent MRI that abuts her esophagus.  Unclear how much role this is playing.     Subjective:  HPI:  Patient is here for follow-up.  She was last seen 6 months ago.  We started her on Ozempic for insulin resistance.  She was also having some worsening anxiety and we started Celexa 20 mg daily.  Since our last visit she was also seen by pulmonology.  I started her on Flovent and started her on omeprazole as well.  She saw sports medicine a couple weeks ago for follow-up for MRI.  She did have some concerning nonspecific white matter changes on her MRI.  Sports medicine referred her to  neurology.  She has not yet seen them.  She has had worsening dysphagia for the last several months.  Feels like food is getting stuck in her throat.  She is frustrated by her lack of weight loss.        Objective:  Physical Exam: BP 136/84   Pulse 71   Temp 98.4 F (36.9 C) (Temporal)   Ht 5' 3.5" (1.613 m)   Wt 286 lb 6.4 oz (129.9 kg)   LMP 09/17/2020 (Exact Date)   SpO2 98%   BMI 49.94 kg/m   Gen: No acute distress, resting comfortably CV: Regular rate and rhythm with no murmurs appreciated Pulm: Normal work of breathing, clear to auscultation bilaterally with no crackles, wheezes, or rhonchi Neuro: Grossly normal, moves all extremities Psych: Normal affect and thought content  Time Spent: 45 minutes of total time was spent on the date of the encounter performing the following actions: chart review prior to seeing the patient including recent visits with specialists, obtaining history, performing a medically necessary exam, counseling on the treatment plan, placing orders, and documenting in our EHR.        Algis Greenhouse. [name], MD 10/08/2020 8:51 AM

## 2020-10-08 NOTE — Assessment & Plan Note (Signed)
Sports medicine recently ordered MRI which showed nonspecific white matter changes.  She has referral to neurology pending.

## 2020-10-08 NOTE — Assessment & Plan Note (Signed)
She has had some improvement with Celexa 40 mg daily.  She feels like it is not quite enough.  Discussed switching to alternative SSRI or augmentation.  We will start BuSpar 5 mg twice daily.  She will check with me in a couple weeks via MyChart.  We will titrate dose of BuSpar as needed.

## 2020-10-08 NOTE — Assessment & Plan Note (Signed)
Check labs.  Will increase the Ozempic dose to 1 mg weekly as above.  Continue Metformin 1500 mg daily.

## 2020-10-08 NOTE — Assessment & Plan Note (Signed)
Along with pulmonology.  She will be following up with them again soon.

## 2020-10-09 NOTE — Progress Notes (Signed)
Please inform patient of the following:  HEr blood sugar is normal. Her triglycerides were high but not at the point where we need to make any changes to her treatment plan. Would like for her to continue her current treatment plan and we will see her back in a few months.  Debra Miller. [name], MD 10/09/2020 8:36 AM

## 2020-10-20 ENCOUNTER — Other Ambulatory Visit: Payer: BC Managed Care – PPO

## 2020-10-20 ENCOUNTER — Ambulatory Visit (INDEPENDENT_AMBULATORY_CARE_PROVIDER_SITE_OTHER): Payer: BC Managed Care – PPO | Admitting: Gastroenterology

## 2020-10-20 ENCOUNTER — Other Ambulatory Visit: Payer: Self-pay

## 2020-10-20 ENCOUNTER — Encounter: Payer: Self-pay | Admitting: Gastroenterology

## 2020-10-20 VITALS — BP 140/80 | HR 60 | Ht 63.5 in | Wt 284.4 lb

## 2020-10-20 DIAGNOSIS — R197 Diarrhea, unspecified: Secondary | ICD-10-CM | POA: Diagnosis not present

## 2020-10-20 DIAGNOSIS — K2100 Gastro-esophageal reflux disease with esophagitis, without bleeding: Secondary | ICD-10-CM

## 2020-10-20 DIAGNOSIS — R1319 Other dysphagia: Secondary | ICD-10-CM | POA: Diagnosis not present

## 2020-10-20 MED ORDER — PLENVU 140 G PO SOLR: 140.0000 g | ORAL | 0 refills | Status: DC

## 2020-10-20 NOTE — Progress Notes (Signed)
10/20/2020 Debra Miller [id number] 1980-07-20   HISTORY OF PRESENT ILLNESS: This is a 40 year old female who is new to our office.  She has been referred here by her PCP, Dr. [name], for evaluation regarding dysphagia.  She tells me that she has been dealing with acid reflux issues for a long time.  She says that for about the past year that she has been having problems with swallowing solids, liquids, and medications.  She says that it feels very tight or like a squeezing sensation when things try to go down and a lot of times she has to throw it back up.  She says that anything seems to get stuck.  She is on omeprazole 40 mg daily and says that it helps her symptoms may be about 25%.  While she is here she also reports frequent diarrhea.  She says that she used to alternate between constipation and diarrhea, but for the past several months she has been having mostly diarrhea.  She says anytime that she eats anything it seems to go right through her.  She is on metformin and has been on that for years.  She was restarted on Ozempic about 6 months or so ago.  She reports seeing very small amounts of bright red blood on the toilet paper upon wiping.  TSH is normal as well as CBC and CMP.  She has no previous GI history.     Past Medical History:  Diagnosis Date  . Anxiety   . Asthma   . Atypical mole   . Bipolar disorder ([place])   . Chicken pox   . 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 Depression   . Diabetes mellitus without complication ([place])    type II  . Dizziness   . Dysmenorrhea   . Eczema   . Elevated triglycerides with high cholesterol   . Frequent headaches   . GERD (gastroesophageal reflux disease)   . H/O cold sores   . High cholesterol   . Hypertension    high blood pressure readings   . Migraines    with aura  . Obesity   . PCOS (polycystic ovarian syndrome)   . Seasonal allergies   . Urine incontinence   . UTI  (urinary tract infection)    Past Surgical History:  Procedure Laterality Date  . KNEE SURGERY    . RADIOLOGY WITH ANESTHESIA N/A 09/22/2020   Procedure: MRI WITH ANESTHESIA CERVICAL SPINE WITHOUT AND BRAIN WITHOUT;  Surgeon: Radiologist, Medication, MD;  Location: [facility];  Service: Radiology;  Laterality: N/A;  . TONSILLECTOMY AND ADENOIDECTOMY    . TUBAL LIGATION      reports that she has never smoked. She has never used smokeless tobacco. She reports that she does not drink alcohol and does not use drugs. family history includes Arthritis in her maternal grandmother and mother; Asthma in her mother; Breast cancer in her maternal grandmother; Colon polyps in her maternal aunt, mother, and sister; Diabetes in her mother; High Cholesterol in her maternal grandmother and mother; Hyperlipidemia in her mother; Hypertension in her maternal grandmother and mother; Migraines in her maternal grandmother and mother; Rheum arthritis in her mother; Stroke in her maternal grandmother; Thyroid disease in her mother. Allergies  Allergen Reactions  . Ceclor [Cefaclor] Hives  . Pediapred [Prednisolone Sodium Phosphate] Hives      Outpatient Encounter Medications as of 10/20/2020  Medication Sig  . albuterol (VENTOLIN HFA) 108 (90  Base) MCG/ACT inhaler TAKE 2 PUFFS BY MOUTH EVERY 6 HOURS AS NEEDED FOR WHEEZE OR SHORTNESS OF BREATH (Patient taking differently: Inhale 2 puffs into the lungs every 6 (six) hours as needed.)  . busPIRone (BUSPAR) 5 MG tablet Take 1 tablet (5 mg total) by mouth 2 (two) times daily.  . citalopram (CELEXA) 40 MG tablet Take 1 tablet (40 mg total) by mouth daily.  . fluticasone (FLOVENT HFA) 110 MCG/ACT inhaler Inhale 2 puffs into the lungs 2 (two) times daily.  Marland Kitchen gabapentin (NEURONTIN) 300 MG capsule Take 900 mg by mouth 3 (three) times daily.  . metFORMIN (GLUCOPHAGE-XR) 750 MG 24 hr tablet TAKE 2 TABLETS (1,500 MG TOTAL) BY MOUTH DAILY WITH BREAKFAST.  . montelukast (SINGULAIR) 10  MG tablet TAKE 1 TABLET BY MOUTH EVERYDAY AT BEDTIME (Patient taking differently: Take 10 mg by mouth at bedtime.)  . omeprazole (PRILOSEC) 40 MG capsule Take 1 capsule (40 mg total) by mouth daily.  . Semaglutide, 1 MG/DOSE, (OZEMPIC, 1 MG/DOSE,) 4 MG/3ML SOPN Inject 1 mg into the skin once a week.  . spironolactone (ALDACTONE) 100 MG tablet Take 2 tablets (200 mg total) by mouth daily.  . [DISCONTINUED] hydrochlorothiazide (HYDRODIURIL) 12.5 MG tablet Take 1 po QD prn swelling (Patient not taking: Reported on 11/24/2017)  . [DISCONTINUED] potassium chloride 20 MEQ TBCR Take 1 po with the hydrochlorothiazide. (Patient not taking: Reported on 11/24/2017)   No facility-administered encounter medications on file as of 10/20/2020.    REVIEW OF SYSTEMS  : All other systems reviewed and negative except where noted in the History of Present Illness.   PHYSICAL EXAM: BP 140/80   Pulse 60   Ht 5' 3.5" (1.613 m)   Wt 284 lb 6.4 oz (129 kg)   BMI 49.59 kg/m  General: Well developed white female in no acute distress Head: Normocephalic and atraumatic Eyes:  Sclerae anicteric, conjunctiva pink. Ears: Normal auditory acuity Lungs: Clear throughout to auscultation; no W/R/R. Heart: Regular rate and rhythm; no M/R/G. Abdomen: Soft, non-distended.  BS present.  Non-tender. Rectal:  Will be done at the time of colonoscopy. Musculoskeletal: Symmetrical with no gross deformities  Skin: No lesions on visible extremities Extremities: No edema  Neurological: Alert oriented x 4, grossly non-focal Psychological:  Alert and cooperative. Normal mood and affect  ASSESSMENT AND PLAN: *Dysphagia/GERD: Longstanding issue for years.  Omeprazole 40 mg daily helps minimally.  She describes it as feeling very "tight" or like a "squeezing" throughout her esophagus when she swallows.  We will plan for EGD with Dr. Tarri Glenn.  Rule out esophageal stricture, EOE, etc.  She is going to have this performed next week.  She  will just remain on her omeprazole 40 mg daily for now. *Diarrhea: Previously had been more so of an alternating constipation and diarrhea, but over the past several months it has been more diarrhea, mostly postprandial.  I wonder if this is side effect of her medications.  She is on metformin and has not been on that for a long time, but Ozempic was added probably just about 6 months or so ago.  She will discuss this with her PCP/prescribing physician.  We will check celiac labs.  We will plan for colonoscopy as well to rule out IBD, microscopic colitis, etc.  Possible component of IBS.  **The risks, benefits, and alternatives to EGD and colonoscopy were discussed with the patient and she consents to proceed.   She elected to have EGD and colonoscopy performed separately so that  she could have the EGD performed soon.   CC:  Vivi Barrack, MD

## 2020-10-20 NOTE — Progress Notes (Signed)
Reviewed. Would recommend a trial of dicyclomine 20 mg QID - ideally taken prior to meals in addition to endoscopic evaluation as planned.   Makaylen Thieme L. Tarri Glenn, MD, MPH

## 2020-10-20 NOTE — Patient Instructions (Addendum)
If you are age 39 or older, your body mass index should be between 23-30. Your Body mass index is 49.59 kg/m. If this is out of the aforementioned range listed, please consider follow up with your Primary Care Provider.  If you are age 5 or younger, your body mass index should be between 19-25. Your Body mass index is 49.59 kg/m. If this is out of the aformentioned range listed, please consider follow up with your Primary Care Provider.   Please discuss the diarrhea with your family doctor   You have been scheduled for an endoscopy and colonoscopy. Please follow the written instructions given to you at your visit today. Please pick up your prep supplies at the pharmacy within the next 1-3 days. If you use inhalers (even only as needed), please bring them with you on the day of your procedure.  Your provider has requested that you go to the basement level for lab work before leaving today. Press "B" on the elevator. The lab is located at the first door on the left as you exit the elevator.  Due to recent changes in healthcare laws, you may see the results of your imaging and laboratory studies on MyChart before your provider has had a chance to review them.  We understand that in some cases there may be results that are confusing or concerning to you. Not all laboratory results come back in the same time frame and the provider may be waiting for multiple results in order to interpret others.  Please give [place] 48 hours in order for your provider to thoroughly review all the results before contacting the office for clarification of your results.    Thank you for trusting me with your gastrointestinal care!    Alonza Bogus, PA-C     *

## 2020-10-21 LAB — IGA: Immunoglobulin A: 151 mg/dL (ref 47–310)

## 2020-10-21 LAB — TISSUE TRANSGLUTAMINASE, IGA: (tTG) Ab, IgA: <1 U/mL

## 2020-10-22 ENCOUNTER — Other Ambulatory Visit: Payer: Self-pay

## 2020-10-22 MED ORDER — DICYCLOMINE HCL 10 MG PO CAPS: 20.0000 mg | ORAL_CAPSULE | Freq: Four times a day (QID) | ORAL | 1 refills | Status: DC

## 2020-10-26 ENCOUNTER — Ambulatory Visit: Payer: BC Managed Care – PPO | Admitting: Neurology

## 2020-10-26 ENCOUNTER — Encounter: Payer: Self-pay | Admitting: Neurology

## 2020-10-26 VITALS — BP 132/80 | HR 75 | Ht 63.5 in | Wt 279.0 lb

## 2020-10-26 DIAGNOSIS — R208 Other disturbances of skin sensation: Secondary | ICD-10-CM | POA: Diagnosis not present

## 2020-10-26 NOTE — Patient Instructions (Signed)
Paresthesia Paresthesia is a burning or prickling feeling. This feeling can happen in any part of the body. It often happens in the hands, arms, legs, or feet. Usually, it is not painful. In most cases, the feeling goes away in a short time and is not a sign of a serious problem. If you have paresthesia that lasts a long time, you need to be seen by your doctor. Follow these instructions at home: Alcohol use  Do not drink alcohol if: ? Your doctor tells you not to drink. ? You are pregnant, may be pregnant, or are planning to become pregnant.  If you drink alcohol: ? Limit how much you use to:  0-1 drink a day for women.  0-2 drinks a day for men. ? Be aware of how much alcohol is in your drink. In the [place] one drink equals one 12 oz bottle of beer (355 mL), one 5 oz glass of wine (148 mL), or one 1 oz glass of hard liquor (44 mL).   Nutrition  Eat a healthy diet. This includes: ? Eating foods that have a lot of fiber in them, such as fresh fruits and vegetables, whole grains, and beans. ? Limiting foods that have a lot of fat and processed sugars in them, such as fried or sweet foods.   General instructions  Take over-the-counter and prescription medicines only as told by your doctor.  Do not use any products that have nicotine or tobacco in them, such as cigarettes and e-cigarettes. If you need help quitting, ask your doctor.  If you have diabetes, work with your doctor to make sure your blood sugar stays in a healthy range.  If your feet feel numb: ? Check for redness, warmth, and swelling every day. ? Wear padded socks and comfortable shoes. These help protect your feet.  Keep all follow-up visits as told by your doctor. This is important. Contact a doctor if:  You have paresthesia that gets worse or does not go away.  Lose feeling (have numbness) after an injury.  Your burning or prickling feeling gets worse when you walk.  You have pain or cramps.  You feel dizzy  or pass out (faint).  You have a rash. Get help right away if you:  Feel weak or have new weakness in an arm or leg.  Have trouble walking or moving.  Have problems speaking, understanding, or seeing.  Feel confused.  Cannot control when you pee (urinate) or poop (have a bowel movement). Summary  Paresthesia is a burning or prickling feeling. It often happens in the hands, arms, legs, or feet.  In most cases, the feeling goes away in a short time and is not a sign of a serious problem.  If you have paresthesia that lasts a long time, you need to be seen by your doctor. This information is not intended to replace advice given to you by your health care provider. Make sure you discuss any questions you have with your health care provider. Document Revised: 03/31/2020 Document Reviewed: 03/31/2020 Elsevier Patient Education  2021 [employer].

## 2020-10-26 NOTE — Progress Notes (Signed)
SLEEP MEDICINE CLINIC    Provider:  Larey Seat, MD  Primary Care Physician:  [address], [place] [address]     Referring Provider: Gregor Hams, [place],  [place] [address]          Chief Complaint according to patient   Patient presents with:    . New Problem      Internal referral for paresthesia over her whole body. It is constant. Severity worsens. Sx started about 5 years ago and gotten worse over time. Denies any injury.       HISTORY OF PRESENT ILLNESS:    Debra Miller is a 40 Year- old White or Caucasian female patient who has seen me in 2021 for evaluation of a sleep disorder.  She is now here for a entirely new problem she states that she has dysesthesias that have affected her body but also face and eye.  She has frequently headaches, neck pain and she feels often as if she could fall in the moment.  She also has trouble swallowing, chest tightness this was described, spasms in my back on them and dropping things from either hand a lot.  She has followed with sports medicine where she received EMG and nerve conduction studies, she has had an MRI of the brain and an MRI of the cervical spine.  It is the findings on the cervical spine that have led to this referral.  First of all there is a straightening of her lordosis, there were no significant disc bulges the MRI of the brain showed left cerebral subcortical white matter hyperintensities described as nonspecific.   There were no significantfindings - , nor neuroforaminal stenosis noted , no cord lesions were seen. She is awaiting endoscopic evaluation, eye testing. We reviewed the cervical spine and brain  MRis together, there is no abnormality for age - NONE.  \ EMG and NCV IMPRESSION:  Nerve conduction studies done on lower extremities were within normal limits.  No evidence of a neuropathy was seen.  EMG of the right lower extremity was unremarkable, there is no  evidence of an overlying lumbosacral radiculopathy.  Jill Alexanders MD 02/04/2020 2:21 PM  She has not found any circadian rhythm to her symptoms.  She has chronic , frequent back spasms- and struggles with weight.        Lastseen here on 11-12-2019 upon a referral on 10/26/2020 from Dr [name]. Chief concern according to patient :  I am just drained, just no energy - I was diagnosed with PCOS. I didn't have a period for years- but I had 5 pregnancies, and I got pregnant easily, 3 living children. I lost hair, and acne was going crazy, gained weight.   I have the pleasure of seeing Debra Miller today, a right -handed Caucasian female with a possible sleep disorder. 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 she reports RLS , but only in the right leg. She has ankle edema.  She had swimmer's ear in 08-2019, ear infection, swelling and pain. Hirsitusm.  Sleep relevant medical history: Nocturia; 4-5 times (!), asthma in childhood, Tonsillectomy, had tubes in her ears. Weight gain-  Each time pregnant , she would gain up to 70 pounds and could only lose a part of it. At the heaviest 286 pounds,  RLS started in pregnancy, while anemic .     Family medical /sleep history:  Sister and mother on CPAP with OSA.    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  Pets are not present. Tobacco use: none   ETOH  use; none , Caffeine intake in form of Coffee( none ) Soda( once in a month) Tea ( 2/ week ) no energy drinks. Regular exercise - " no energy to do it"   Sleep habits are as follows: The patient's dinner time is between 6-8 PM.  The patient goes to bed at 10.30 PM and struggles to sleep- once asleep , she continues to sleep for only 1-2  hours, wakes for many, many bathroom breaks,and she is a light sleeper.  The preferred sleep position is left, right and supine  with the support of 2 pillows.  Dreams are reportedly frequent. averaging 5 hours of nocturnal sleep. Has GERD at night, post nasal drip. 7.45 AM is the usual rise time. The patient wakes up at 7.15-7.30 with several alarms.  She reports not feeling refreshed or restored in AM, with symptoms such as dry mouth , morning headaches- migraines all the time-  There when she wakes up, and residual fatigue. Naps are taken infrequently, lasting from 4-5 hours and are more refreshing than nocturnal sleep.    Review of Systems: Out of a complete 14 system review, the patient complains of only the following symptoms, and all other reviewed systems are negative.:  EPWORTH Total = 15/ 24 points before CPAP-   FSS endorsed at 56/ 63 points.   Social History   Socioeconomic History  . Marital status: Married    Spouse name: Not on file  . Number of children: Not on file  . Years of education: Not on file  . Highest education level: Not on file  Occupational History  . Not on file  Tobacco Use  . Smoking status: Never Smoker  . Smokeless tobacco: Never Used  Substance and Sexual Activity  . Alcohol use: No    Alcohol/week: 0.0 standard drinks  . Drug use: No  . Sexual activity: Yes    Partners: Male    Birth control/protection: Surgical    Comment: Tubal  Other Topics Concern  . Not on file  Social History Narrative   Work or School: homemaker      Home Situation: lives with husband and 5 kids (ages 54-14 in 2014)      Spiritual  Beliefs: Christian      Lifestyle: no regular exercise, poor diet, lots of soda            Social Determinants of Health   Financial Resource Strain:   . Difficulty of Paying Living Expenses:   Food Insecurity:   . Worried About Charity fundraiser in the Last Year:   . Arboriculturist in the Last Year:   Transportation Needs:   . Film/video editor (Medical):   Marland Kitchen Lack of Transportation (Non-Medical):   Physical Activity:   . Days of Exercise per Week:   . Minutes of Exercise per Session:  Stress:   . Feeling of Stress :   Social Connections:   . Frequency of Communication with Friends and Family:   . Frequency of Social Gatherings with Friends and Family:   . Attends Religious Services: yes  . Active Member of Clubs or Organizations:   . Attends Archivist Meetings:   Marland Kitchen Marital Status: married     Family History  Problem Relation Age of Onset  . Arthritis Mother   . High Cholesterol Mother   . Hypertension Mother   . Diabetes Mother   . Rheum arthritis Mother   . Asthma Mother   . Hyperlipidemia Mother   . Migraines Mother   . Thyroid disease Mother   . Colon polyps Mother   . Arthritis Maternal Grandmother   . Breast cancer Maternal Grandmother   . High Cholesterol Maternal Grandmother   . Stroke Maternal Grandmother   . Hypertension Maternal Grandmother   . Migraines Maternal Grandmother   . Colon polyps Sister   . Colon polyps Maternal Aunt     Past Medical History:  Diagnosis Date  . Asthma  PCOS    . Bipolar disorder ([place])   . Chicken pox   . 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 Depression   . Dizziness   . Dysmenorrhea   . Eczema   . Elevated triglycerides with high cholesterol   . Frequent headaches   . GERD (gastroesophageal reflux disease)   . H/O cold sores   . High cholesterol   . Hypertension    high blood pressure readings   . Migraines    with aura  . Seasonal  allergies   . Urine incontinence   . UTI (urinary tract infection)     Past Surgical History:  Procedure Laterality Date  . KNEE SURGERY    . RADIOLOGY WITH ANESTHESIA N/A 09/22/2020   Procedure: MRI WITH ANESTHESIA CERVICAL SPINE WITHOUT AND BRAIN WITHOUT;  Surgeon: Radiologist, Medication, MD;  Location: [facility];  Service: Radiology;  Laterality: N/A;  . TONSILLECTOMY AND ADENOIDECTOMY    . TUBAL LIGATION       Current Outpatient Medications on File Prior to Visit  Medication Sig Dispense Refill  . albuterol (VENTOLIN HFA) 108 (90 Base) MCG/ACT inhaler TAKE 2 PUFFS BY MOUTH EVERY 6 HOURS AS NEEDED FOR WHEEZE OR SHORTNESS OF BREATH (Patient taking differently: Inhale 2 puffs into the lungs every 6 (six) hours as needed.) 19 g 1  . busPIRone (BUSPAR) 5 MG tablet Take 1 tablet (5 mg total) by mouth 2 (two) times daily. 60 tablet 5  . citalopram (CELEXA) 40 MG tablet Take 1 tablet (40 mg total) by mouth daily. 90 tablet 3  . dicyclomine (BENTYL) 10 MG capsule Take 2 capsules (20 mg total) by mouth in the morning, at noon, in the evening, and at bedtime. 240 capsule 1  . fluticasone (FLOVENT HFA) 110 MCG/ACT inhaler Inhale 2 puffs into the lungs 2 (two) times daily. 1 each 12  . gabapentin (NEURONTIN) 300 MG capsule Take 900 mg by mouth 3 (three) times daily.    . metFORMIN (GLUCOPHAGE-XR) 750 MG 24 hr tablet TAKE 2 TABLETS (1,500 MG TOTAL) BY MOUTH DAILY WITH BREAKFAST. 180 tablet 1  . montelukast (SINGULAIR) 10 MG tablet TAKE 1 TABLET BY MOUTH EVERYDAY AT BEDTIME (Patient taking differently: Take 10 mg by mouth at bedtime.) 90 tablet 1  . omeprazole (PRILOSEC) 40 MG capsule Take 1 capsule (40 mg total)  by mouth daily. 30 capsule 6  . PEG-KCl-NaCl-NaSulf-Na Asc-C (PLENVU) 140 g SOLR Take 140 g by mouth as directed. Manufacturer's coupon Universal coupon code:BIN: P2366821; GROUP: [id number]; PCN: [id number]; ID: [id number]; PAY NO MORE $50 1 each 0  . Semaglutide, 1 MG/DOSE, (OZEMPIC, 1 MG/DOSE,) 4  MG/3ML SOPN Inject 1 mg into the skin once a week. 9 mL 3  . spironolactone (ALDACTONE) 100 MG tablet Take 2 tablets (200 mg total) by mouth daily. 180 tablet 3   No current facility-administered medications on file prior to visit.    Allergies  Allergen Reactions  . Ceclor [Cefaclor] Hives  . Pediapred [Prednisolone Sodium Phosphate] Hives    Physical exam:  Today's Vitals   10/26/20 1412  BP: 132/80  Pulse: 75  SpO2: 98%  Weight: 279 lb (126.6 kg)  Height: 5' 3.5" (1.613 m)   Body mass index is 48.65 kg/m.   Wt Readings from Last 3 Encounters:  10/26/20 279 lb (126.6 kg)  10/20/20 284 lb 6.4 oz (129 kg)  10/08/20 286 lb 6.4 oz (129.9 kg)     Ht Readings from Last 3 Encounters:  10/26/20 5' 3.5" (1.613 m)  10/20/20 5' 3.5" (1.613 m)  10/08/20 5' 3.5" (1.613 m)      General: The patient is awake, alert and appears not in acute distress. The patient is well groomed. Head: Normocephalic, atraumatic. Neck is supple. Mallampati ; high Grade 3 PLUS - non visible soft palate- large tongue, small jaw , prognathia.   neck circumference:21  inches . Nasal airflow patent.   Dental status:  Cardiovascular:  Regular rate and cardiac rhythm by pulse,  without distended neck veins. Respiratory: Lungs are clear to auscultation.  Skin:  Without evidence of ankle edema, or rash. Many skin tags.  Trunk: The patient's posture is erect.   Neurologic exam : The patient is awake and alert, oriented to place and time.   Memory subjective described as intact.  Attention span & concentration ability appears normal.  Speech is fluent,  without  dysarthria, dysphonia or aphasia.  Mood and affect are appropriate.   Cranial nerves: no loss of smell or taste reported  Pupils are slightly unequal in size, left is larger by 1-2 mm but both are briskly reactive to light. Funduscopic exam deferred.   Extraocular movements in vertical and horizontal planes were intact and without nystagmus. No  Diplopia. Visual fields by finger perimetry are intact. Hearing was intact to soft voice and finger rubbing.    Facial sensation intact to fine touch.   Facial motor strength is symmetric and tongue and uvula move midline.  Neck ROM : rotation, tilt and flexion extension were normal for age and shoulder shrug was symmetrical.    Motor exam:  Symmetric bulk, tone and ROM.   Normal tone without cog wheeling,  Right sided there is a less strong grip - she didn't close her fingers completely- symmetric grip strength .   Sensory:  Fine touch  and vibration were normal. She reports feeling more numbness on the right side/ .   Proprioception tested in the upper extremities was normal.   Coordination:   Gait and station:  Deep tendon reflexes: in the  upper and lower extremities are symmetric and intact.  Babinski response was deferred .       High triglycerides, no anemia , normal TSH , Hb a1c was 5.3 . ANA ab negative, MRI brain nad cervical spine negative, EMG and NCV right extremities- negative.  After spending a total time of 20 minutes face to face and additional time for physical and neurologic examination, review of laboratory studies,  personal review of imaging studies, reports and results of other testing and review of referral information / records as far as provided in visit, I have established the following assessments:  I cannot find a neurologoc cause for the patient's symptoms.    I do not plan to follow up.   CC: I will share my notes with PCP.  Electronically signed by: Larey Seat, MD 10/26/2020 3:31 PM  [facility] and [facility] Sleep Board certified by The [employer] of Sleep Medicine and Diplomate of the [employer] of Sleep Medicine. Board certified In Neurology through the [place], Fellow of the [employer] of Neurology. Medical Director of [employer].

## 2020-10-29 ENCOUNTER — Other Ambulatory Visit: Payer: Self-pay | Admitting: Pulmonary Disease

## 2020-10-30 ENCOUNTER — Encounter: Payer: Self-pay | Admitting: Gastroenterology

## 2020-10-30 ENCOUNTER — Ambulatory Visit (AMBULATORY_SURGERY_CENTER): Payer: BC Managed Care – PPO | Admitting: Gastroenterology

## 2020-10-30 ENCOUNTER — Other Ambulatory Visit: Payer: Self-pay

## 2020-10-30 VITALS — BP 118/67 | HR 68 | Temp 97.3°F | Resp 15 | Ht 63.5 in | Wt 284.0 lb

## 2020-10-30 DIAGNOSIS — K319 Disease of stomach and duodenum, unspecified: Secondary | ICD-10-CM

## 2020-10-30 DIAGNOSIS — R1319 Other dysphagia: Secondary | ICD-10-CM

## 2020-10-30 DIAGNOSIS — K2100 Gastro-esophageal reflux disease with esophagitis, without bleeding: Secondary | ICD-10-CM

## 2020-10-30 DIAGNOSIS — K2970 Gastritis, unspecified, without bleeding: Secondary | ICD-10-CM

## 2020-10-30 DIAGNOSIS — K2990 Gastroduodenitis, unspecified, without bleeding: Secondary | ICD-10-CM

## 2020-10-30 MED ORDER — SODIUM CHLORIDE 0.9 % IV SOLN: 500.0000 mL | Freq: Once | INTRAVENOUS | Status: DC

## 2020-10-30 MED ORDER — OMEPRAZOLE 40 MG PO CPDR: 40.0000 mg | DELAYED_RELEASE_CAPSULE | Freq: Two times a day (BID) | ORAL | 3 refills | Status: DC

## 2020-10-30 NOTE — Progress Notes (Signed)
A/ox3, pleased with MAC, report to RN 

## 2020-10-30 NOTE — Progress Notes (Signed)
Pt's states no medical or surgical changes since previsit or office visit. 

## 2020-10-30 NOTE — Progress Notes (Signed)
Called to room to assist during endoscopic procedure.  Patient ID and intended procedure confirmed with present staff. Received instructions for my participation in the procedure from the performing physician.  

## 2020-10-30 NOTE — Op Note (Signed)
[facility] Patient Name: Jennifer Payes Procedure Date: 10/30/2020 11:24 AM MRN: [id number] Endoscopist: Thornton Park MD, MD Age: 40 Referring MD:  Date of Birth: January 11, 1981 Gender: Female Account #: [id number] Procedure:                Upper GI endoscopy Indications:              Dysphagia, Diarrhea Medicines:                Monitored Anesthesia Care Procedure:                Pre-Anesthesia Assessment:                           - Prior to the procedure, a History and Physical                            was performed, and patient medications and                            allergies were reviewed. The patient's tolerance of                            previous anesthesia was also reviewed. The risks                            and benefits of the procedure and the sedation                            options and risks were discussed with the patient.                            All questions were answered, and informed consent                            was obtained. Prior Anticoagulants: The patient has                            taken no previous anticoagulant or antiplatelet                            agents. ASA Grade Assessment: III - A patient with                            severe systemic disease. After reviewing the risks                            and benefits, the patient was deemed in                            satisfactory condition to undergo the procedure.                           After obtaining informed consent, the endoscope was  passed under direct vision. Throughout the                            procedure, the patient's blood pressure, pulse, and                            oxygen saturations were monitored continuously. The                            Endoscope was introduced through the mouth, and                            advanced to the third part of duodenum. The upper                            GI endoscopy was accomplished  without difficulty.                            The patient tolerated the procedure well. Scope In: Scope Out: Findings:                 LA Grade A (one or more mucosal breaks less than 5                            mm, not extending between tops of 2 mucosal folds)                            esophagitis with no bleeding was found. The GE                            junction is mildly congested. Biopsies were taken                            from the mid/proximal and distal esophagus with a                            cold forceps for histology. There were no rings,                            web, or stricture. Estimated blood loss was minimal.                           A few dispersed small erosions with no bleeding and                            no stigmata of recent bleeding were found in the                            gastric antrum. Biopsies were taken from the                            antrum, body, and fundus with a cold forceps for  histology. Estimated blood loss was minimal.                           The examined duodenum was normal. Biopsies were                            taken with a cold forceps for histology. Estimated                            blood loss was minimal.                           The exam was otherwise without abnormality. Complications:            No immediate complications. Estimated blood loss:                            Minimal. Estimated Blood Loss:     Estimated blood loss was minimal. Impression:               - LA Grade A reflux esophagitis with no bleeding.                            Biopsied.                           - Erosive gastropathy with no bleeding and no                            stigmata of recent bleeding. Biopsied.                           - Normal examined duodenum. Biopsied.                           - The examination was otherwise normal. Recommendation:           - Patient has a contact number available for                             emergencies. The signs and symptoms of potential                            delayed complications were discussed with the                            patient. Return to normal activities tomorrow.                            Written discharge instructions were provided to the                            patient.                           - Resume previous diet.                           -  Continue present medications. Increase omeprazole                            to 40 mg BID.                           - Await pathology results.                           - Follow-up in the office with J. Zehr in 4-6                            weeks, earlier if needed. Thornton Park MD, MD 10/30/2020 11:47:03 AM This report has been signed electronically.

## 2020-10-30 NOTE — Patient Instructions (Signed)
Await pathology results.  Resume previous diet and medications, including Omeprazole.  Increase Omeprazole 40 mg to twice a day.  Follow up in the office with J. Zehr in 4-6 weeks.  Information on gastritis given to you today.  YOU HAD AN ENDOSCOPIC PROCEDURE TODAY AT [facility]:   Refer to the procedure report that was given to you for any specific questions about what was found during the examination.  If the procedure report does not answer your questions, please call your gastroenterologist to clarify.  If you requested that your care partner not be given the details of your procedure findings, then the procedure report has been included in a sealed envelope for you to review at your convenience later.  YOU SHOULD EXPECT: Some feelings of bloating in the abdomen. Passage of more gas than usual.  Walking can help get rid of the air that was put into your GI tract during the procedure and reduce the bloating. If you had a lower endoscopy (such as a colonoscopy or flexible sigmoidoscopy) you may notice spotting of blood in your stool or on the toilet paper. If you underwent a bowel prep for your procedure, you may not have a normal bowel movement for a few days.  Please Note:  You might notice some irritation and congestion in your nose or some drainage.  This is from the oxygen used during your procedure.  There is no need for concern and it should clear up in a day or so.  SYMPTOMS TO REPORT IMMEDIATELY:    Following upper endoscopy (EGD)  Vomiting of blood or coffee ground material  New chest pain or pain under the shoulder blades  Painful or persistently difficult swallowing  New shortness of breath  Fever of 100F or higher  Black, tarry-looking stools  For urgent or emergent issues, a gastroenterologist can be reached at any hour by calling [phone]. Do not use MyChart messaging for urgent concerns.    DIET:  We do recommend a small meal at first, but then  you may proceed to your regular diet.  Drink plenty of fluids but you should avoid alcoholic beverages for 24 hours.  ACTIVITY:  You should plan to take it easy for the rest of today and you should NOT DRIVE or use heavy machinery until tomorrow (because of the sedation medicines used during the test).    FOLLOW UP: Our staff will call the number listed on your records 48-72 hours following your procedure to check on you and address any questions or concerns that you may have regarding the information given to you following your procedure. If we do not reach you, we will leave a message.  We will attempt to reach you two times.  During this call, we will ask if you have developed any symptoms of COVID 19. If you develop any symptoms (ie: fever, flu-like symptoms, shortness of breath, cough etc.) before then, please call [phone].  If you test positive for Covid 19 in the 2 weeks post procedure, please call and report this information to [place].    If any biopsies were taken you will be contacted by phone or by letter within the next 1-3 weeks.  Please call us at [phone] if you have not heard about the biopsies in 3 weeks.    SIGNATURES/CONFIDENTIALITY: You and/or your care partner have signed paperwork which will be entered into your electronic medical record.  These signatures attest to the fact that that the information  above on your After Visit Summary has been reviewed and is understood.  Full responsibility of the confidentiality of this discharge information lies with you and/or your care-partner.

## 2020-10-31 ENCOUNTER — Other Ambulatory Visit: Payer: Self-pay | Admitting: Family Medicine

## 2020-11-03 ENCOUNTER — Telehealth: Payer: Self-pay | Admitting: *Deleted

## 2020-11-03 ENCOUNTER — Telehealth: Payer: Self-pay

## 2020-11-03 NOTE — Telephone Encounter (Signed)
Called [phone] and left a message we tried to reach pt for a follow up call. maw

## 2020-11-03 NOTE — Telephone Encounter (Signed)
Attempted 2nd f/u phone call. No answer. Mailbox full, unable to leave message.

## 2020-11-09 ENCOUNTER — Ambulatory Visit: Payer: BC Managed Care – PPO | Admitting: Pulmonary Disease

## 2020-11-10 ENCOUNTER — Encounter: Payer: Self-pay | Admitting: Gastroenterology

## 2020-11-18 ENCOUNTER — Ambulatory Visit: Payer: BC Managed Care – PPO | Admitting: Pulmonary Disease

## 2020-11-18 NOTE — Progress Notes (Incomplete)
Synopsis: Referred in March 2022 by [name], MD for asthma  Subjective:   PATIENT ID: Debra Miller GENDER: female DOB: 21-Oct-1980, MRN: [id number]   HPI  No chief complaint on file.  Debra Miller is a 40 year old woman, never smoker with hypertension, obesity, mild obstructive sleep apnea on CPAP, anxiety and asthma who is referred to pulmonary clinic for asthma evaluation.   She has history of childhood asthma where she required maintenance inhaler therapy and frequent nebulizer treatments. Her breathing improved in her 38s and then over the past 4 years her breathing symptoms have worsened.   She was diagnosed with PCOS 1.5 years ago after she was experiencing unexpected weight gain. She was diagnosed with mild obstructive sleep apnea in 2021 and is on auto-titrating CPAP with an AHI of 0.3/hr based on her last sleep appointment 03/2020.   She currently uses albuterol 2 times per day without much benefit.   She is experiencing dyspnea mainly with exertion but also at rest. She reports taking large gasping breaths spontaneously while sitting and watching tv. She has a cough with the dyspnea with exertion. She reports occasional wheezing.   Her mother and sister have asthma. Her kids also have asthma. She has 5 kids, ages 55 to 46.   She experiences reflux symptoms 2-3 times per week. She has difficulty swallowing pills at times and feels her esophagus is narrow. She has never had an EGD.   She denies sinus congestion or drainage.   Past Medical History:  Diagnosis Date  . Anxiety   . Asthma   . Atypical mole   . Bipolar disorder ([place])   . Chicken pox   . 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 Depression   . Diabetes mellitus without complication ([place])    type II  . Dizziness   . Dysmenorrhea   . Eczema   . Elevated triglycerides with high cholesterol   . Frequent headaches   . GERD (gastroesophageal reflux disease)    . H/O cold sores   . High cholesterol   . Hypertension    high blood pressure readings   . Migraines    with aura  . Obesity   . PCOS (polycystic ovarian syndrome)   . Seasonal allergies   . Urine incontinence   . UTI (urinary tract infection)      Family History  Problem Relation Age of Onset  . Arthritis Mother   . High Cholesterol Mother   . Hypertension Mother   . Diabetes Mother   . Rheum arthritis Mother   . Asthma Mother   . Hyperlipidemia Mother   . Migraines Mother   . Thyroid disease Mother   . Colon polyps Mother   . Arthritis Maternal Grandmother   . Breast cancer Maternal Grandmother   . High Cholesterol Maternal Grandmother   . Stroke Maternal Grandmother   . Hypertension Maternal Grandmother   . Migraines Maternal Grandmother   . Colon polyps Sister   . Colon polyps Maternal Aunt   . Colon cancer Neg Hx   . Esophageal cancer Neg Hx   . Rectal cancer Neg Hx   . Stomach cancer Neg Hx      Social History   Socioeconomic History  . Marital status: Married    Spouse name: Not on file  . Number of children: Not on file  . Years of education: Not on file  . Highest education level: Not on  file  Occupational History  . Not on file  Tobacco Use  . Smoking status: Never Smoker  . Smokeless tobacco: Never Used  Vaping Use  . Vaping Use: Never used  Substance and Sexual Activity  . Alcohol use: No    Alcohol/week: 0.0 standard drinks  . Drug use: No  . Sexual activity: Yes    Partners: Male    Birth control/protection: Surgical    Comment: Tubal  Other Topics Concern  . Not on file  Social History Narrative   Work or School: homemaker      Home Situation: lives with husband and 5 kids (ages 25-14 in 2014)      Spiritual Beliefs: Christian      Lifestyle: no regular exercise, poor diet, lots of soda            Social Determinants of Health   Financial Resource Strain: Not on file  Food Insecurity: Not on file  Transportation Needs:  Not on file  Physical Activity: Not on file  Stress: Not on file  Social Connections: Not on file  Intimate Partner Violence: Not on file     Allergies  Allergen Reactions  . Ceclor [Cefaclor] Hives  . Pediapred [Prednisolone Sodium Phosphate] Hives     Outpatient Medications Prior to Visit  Medication Sig Dispense Refill  . albuterol (VENTOLIN HFA) 108 (90 Base) MCG/ACT inhaler TAKE 2 PUFFS BY MOUTH EVERY 6 HOURS AS NEEDED FOR WHEEZE OR SHORTNESS OF BREATH (Patient taking differently: Inhale 2 puffs into the lungs every 6 (six) hours as needed.) 19 g 1  . busPIRone (BUSPAR) 5 MG tablet Take 1 tablet (5 mg total) by mouth 2 (two) times daily. 180 tablet 1  . citalopram (CELEXA) 40 MG tablet Take 1 tablet (40 mg total) by mouth daily. 90 tablet 3  . dicyclomine (BENTYL) 10 MG capsule Take 2 capsules (20 mg total) by mouth in the morning, at noon, in the evening, and at bedtime. 240 capsule 1  . FLOVENT HFA 110 MCG/ACT inhaler INHALE 2 PUFFS INTO THE LUNGS TWICE A DAY 36 each 3  . gabapentin (NEURONTIN) 300 MG capsule Take 900 mg by mouth 3 (three) times daily.    . metFORMIN (GLUCOPHAGE-XR) 750 MG 24 hr tablet TAKE 2 TABLETS (1,500 MG TOTAL) BY MOUTH DAILY WITH BREAKFAST. 180 tablet 1  . montelukast (SINGULAIR) 10 MG tablet TAKE 1 TABLET BY MOUTH EVERYDAY AT BEDTIME (Patient taking differently: Take 10 mg by mouth at bedtime.) 90 tablet 1  . omeprazole (PRILOSEC) 40 MG capsule Take 1 capsule (40 mg total) by mouth daily. 30 capsule 6  . omeprazole (PRILOSEC) 40 MG capsule Take 1 capsule (40 mg total) by mouth 2 (two) times daily. 90 capsule 3  . PEG-KCl-NaCl-NaSulf-Na Asc-C (PLENVU) 140 g SOLR Take 140 g by mouth as directed. Manufacturer's coupon Universal coupon code:BIN: P2366821; GROUP: [id number]; PCN: [id number]; ID: [id number]; PAY NO MORE $50 (Patient not taking: Reported on 10/30/2020) 1 each 0  . Semaglutide, 1 MG/DOSE, (OZEMPIC, 1 MG/DOSE,) 4 MG/3ML SOPN Inject 1 mg into the skin once a  week. 9 mL 3  . spironolactone (ALDACTONE) 100 MG tablet Take 2 tablets (200 mg total) by mouth daily. 180 tablet 3   Facility-Administered Medications Prior to Visit  Medication Dose Route Frequency Provider Last Rate Last Admin  . 0.9 %  sodium chloride infusion  500 mL Intravenous Once Thornton Park, MD        Review of Systems  Constitutional: Negative  for chills, fever, malaise/fatigue and weight loss.  HENT: Negative for congestion, sinus pain and sore throat.   Eyes: Negative.   Respiratory: Positive for cough, shortness of breath and wheezing. Negative for hemoptysis and sputum production.   Cardiovascular: Negative for chest pain, palpitations, orthopnea, claudication and leg swelling.  Gastrointestinal: Negative for abdominal pain, heartburn, nausea and vomiting.  Genitourinary: Negative.   Musculoskeletal: Negative for joint pain and myalgias.  Skin: Negative for rash.  Neurological: Negative for weakness.  Endo/Heme/Allergies: Negative.   Psychiatric/Behavioral: The patient is nervous/anxious.    Objective:   There were no vitals filed for this visit. Physical Exam Constitutional:      General: She is not in acute distress.    Appearance: She is obese. She is not ill-appearing.  HENT:     Head: Normocephalic and atraumatic.  Eyes:     General: No scleral icterus.    Conjunctiva/sclera: Conjunctivae normal.     Pupils: Pupils are equal, round, and reactive to light.  Cardiovascular:     Rate and Rhythm: Normal rate and regular rhythm.     Pulses: Normal pulses.     Heart sounds: Normal heart sounds. No murmur heard.   Pulmonary:     Effort: Pulmonary effort is normal.     Breath sounds: Normal breath sounds. No wheezing, rhonchi or rales.  Abdominal:     General: Bowel sounds are normal.     Palpations: Abdomen is soft.  Musculoskeletal:     Right lower leg: No edema.     Left lower leg: No edema.  Lymphadenopathy:     Cervical: No cervical  adenopathy.  Skin:    General: Skin is warm and dry.  Neurological:     General: No focal deficit present.     Mental Status: She is alert.  Psychiatric:        Mood and Affect: Mood normal.        Behavior: Behavior normal.        Thought Content: Thought content normal.        Judgment: Judgment normal.    CBC    Component Value Date/Time   WBC 8.4 10/08/2020 0846   RBC 4.52 10/08/2020 0846   HGB 13.3 10/08/2020 0846   HGB 14.0 02/19/2014 1151   HCT 39.3 10/08/2020 0846   PLT 221.0 10/08/2020 0846   MCV 86.9 10/08/2020 0846   MCH 29.5 12/11/2016 0358   MCHC 33.7 10/08/2020 0846   RDW 13.7 10/08/2020 0846   LYMPHSABS 3.0 11/28/2019 1421   MONOABS 0.5 11/28/2019 1421   EOSABS 0.2 11/28/2019 1421   BASOSABS 0.1 11/28/2019 1421   BMP Latest Ref Rng & Units 10/08/2020 11/28/2019 07/23/2019  Glucose 70 - 99 mg/dL 95 100(H) 94  BUN 6 - 23 mg/dL 14 16 15   Creatinine 0.40 - 1.20 mg/dL 0.70 0.74 0.69  Sodium 135 - 145 mEq/L 138 136 140  Potassium 3.5 - 5.1 mEq/L 4.0 4.6 4.4  Chloride 96 - 112 mEq/L 103 102 106  CO2 19 - 32 mEq/L 26 26 25   Calcium 8.4 - 10.5 mg/dL 9.0 10.0 9.1    Chest imaging: CXR 09/02/20 Lung volumes are normal. No consolidative airspace disease. No pleural effusions. No pneumothorax. No pulmonary nodule or mass Noted.  PFT: No flowsheet data found.  Sleep Study 12/27/19 1. Mild Obstructive Sleep Apnea(OSA) at AHI of 10/h with strong  REM sleep dependence.  2. Oxygen desaturations were brief and intermittent and strongly  REM associated.  RECOMMENDATIONS:   1. Advise auto-titration CPAP device for REM sleep dependent  apnea. Setting between 6 and 16 cm water, 2 cm EPR.   2. Obesity treatment , as lower BMI will help to reduce the  degree of apnea.  Right Lower Extremity Doppler US 11/26/19 RIGHT:  - There is no evidence of deep vein thrombosis in the lower extremity.    - No cystic structure found in the popliteal fossa.    Assessment &  Plan:   No diagnosis found.  Discussion: Debra Miller is a 40 year old woman, never smoker with hypertension, obesity, mild obstructive sleep apnea on CPAP, anxiety and asthma who is referred to pulmonary clinic for asthma evaluation.   She has on going dyspnea symptoms over recent years along with cough and intermittent wheezing. We will trial her on flovent 136mcg 2 puffs twice daily and monitor for improvement in her symptoms. If she has some benefit but not complete relief then we can try an ICS/LABA inhaler next. If this is the case then we will check pulmonary function tests.  We will check a chest radiograph due to her shortness of breath.   We will also treat her GERD with omeprazole 40mg  daily. We will try this medication for at last 3 months and monitor her response.   She is followed at [facility] for her sleep apnea.   We discussed the benefits of weight loss which she recognizes.   Follow up in 2 months.   Freda Jackson, MD [facility] Office: [phone]    Current Outpatient Medications:  .  albuterol (VENTOLIN HFA) 108 (90 Base) MCG/ACT inhaler, TAKE 2 PUFFS BY MOUTH EVERY 6 HOURS AS NEEDED FOR WHEEZE OR SHORTNESS OF BREATH (Patient taking differently: Inhale 2 puffs into the lungs every 6 (six) hours as needed.), Disp: 19 g, Rfl: 1 .  busPIRone (BUSPAR) 5 MG tablet, Take 1 tablet (5 mg total) by mouth 2 (two) times daily., Disp: 180 tablet, Rfl: 1 .  citalopram (CELEXA) 40 MG tablet, Take 1 tablet (40 mg total) by mouth daily., Disp: 90 tablet, Rfl: 3 .  dicyclomine (BENTYL) 10 MG capsule, Take 2 capsules (20 mg total) by mouth in the morning, at noon, in the evening, and at bedtime., Disp: 240 capsule, Rfl: 1 .  FLOVENT HFA 110 MCG/ACT inhaler, INHALE 2 PUFFS INTO THE LUNGS TWICE A DAY, Disp: 36 each, Rfl: 3 .  gabapentin (NEURONTIN) 300 MG capsule, Take 900 mg by mouth 3 (three) times daily., Disp: , Rfl:  .  metFORMIN  (GLUCOPHAGE-XR) 750 MG 24 hr tablet, TAKE 2 TABLETS (1,500 MG TOTAL) BY MOUTH DAILY WITH BREAKFAST., Disp: 180 tablet, Rfl: 1 .  montelukast (SINGULAIR) 10 MG tablet, TAKE 1 TABLET BY MOUTH EVERYDAY AT BEDTIME (Patient taking differently: Take 10 mg by mouth at bedtime.), Disp: 90 tablet, Rfl: 1 .  omeprazole (PRILOSEC) 40 MG capsule, Take 1 capsule (40 mg total) by mouth daily., Disp: 30 capsule, Rfl: 6 .  omeprazole (PRILOSEC) 40 MG capsule, Take 1 capsule (40 mg total) by mouth 2 (two) times daily., Disp: 90 capsule, Rfl: 3 .  PEG-KCl-NaCl-NaSulf-Na Asc-C (PLENVU) 140 g SOLR, Take 140 g by mouth as directed. Manufacturer's coupon Universal coupon code:BIN: P2366821; GROUP: [id number]; PCN: [id number]; ID: [id number]; PAY NO MORE $50 (Patient not taking: Reported on 10/30/2020), Disp: 1 each, Rfl: 0 .  Semaglutide, 1 MG/DOSE, (OZEMPIC, 1 MG/DOSE,) 4 MG/3ML SOPN, Inject 1 mg into the skin once a  week., Disp: 9 mL, Rfl: 3 .  spironolactone (ALDACTONE) 100 MG tablet, Take 2 tablets (200 mg total) by mouth daily., Disp: 180 tablet, Rfl: 3  Current Facility-Administered Medications:  .  0.9 %  sodium chloride infusion, 500 mL, Intravenous, Once, Thornton Park, MD

## 2020-11-20 ENCOUNTER — Other Ambulatory Visit: Payer: Self-pay | Admitting: Gastroenterology

## 2020-12-18 ENCOUNTER — Encounter: Payer: Self-pay | Admitting: Family Medicine

## 2020-12-23 ENCOUNTER — Other Ambulatory Visit: Payer: Self-pay | Admitting: Family Medicine

## 2020-12-23 MED ORDER — AMOXICILLIN 875 MG PO TABS: 875.0000 mg | ORAL_TABLET | Freq: Two times a day (BID) | ORAL | 0 refills | Status: DC

## 2020-12-24 ENCOUNTER — Encounter: Payer: BC Managed Care – PPO | Admitting: Gastroenterology

## 2020-12-25 ENCOUNTER — Other Ambulatory Visit: Payer: Self-pay | Admitting: Gastroenterology

## 2020-12-28 ENCOUNTER — Encounter: Payer: BC Managed Care – PPO | Admitting: Gastroenterology

## 2020-12-28 ENCOUNTER — Telehealth: Payer: Self-pay | Admitting: Gastroenterology

## 2020-12-28 NOTE — Telephone Encounter (Signed)
Good morning Dr. Tarri Glenn, patient's husband called to cancel procedure for today stating they had difficulty getting prep medication.  Will call at a later date to reschedule.

## 2020-12-28 NOTE — Telephone Encounter (Signed)
LVM requesting returned call 

## 2020-12-29 ENCOUNTER — Encounter: Payer: Self-pay | Admitting: Family Medicine

## 2020-12-29 NOTE — Telephone Encounter (Signed)
SECOND ATTEMPT: ° °LVM requesting returned call. °

## 2020-12-30 NOTE — Telephone Encounter (Signed)
FINAL ATTEMPT:  LVM requesting returned call 

## 2021-01-01 ENCOUNTER — Encounter: Payer: Self-pay | Admitting: Family Medicine

## 2021-01-01 DIAGNOSIS — R9082 White matter disease, unspecified: Secondary | ICD-10-CM

## 2021-01-01 DIAGNOSIS — R202 Paresthesia of skin: Secondary | ICD-10-CM

## 2021-01-08 ENCOUNTER — Ambulatory Visit: Payer: BC Managed Care – PPO | Admitting: Family Medicine

## 2021-01-08 ENCOUNTER — Other Ambulatory Visit: Payer: Self-pay

## 2021-01-08 ENCOUNTER — Encounter: Payer: Self-pay | Admitting: Family Medicine

## 2021-01-08 VITALS — BP 133/86 | HR 91 | Temp 98.4°F | Ht 63.5 in | Wt 285.4 lb

## 2021-01-08 DIAGNOSIS — R202 Paresthesia of skin: Secondary | ICD-10-CM

## 2021-01-08 DIAGNOSIS — Z6841 Body Mass Index (BMI) 40.0 and over, adult: Secondary | ICD-10-CM

## 2021-01-08 DIAGNOSIS — I10 Essential (primary) hypertension: Secondary | ICD-10-CM

## 2021-01-08 DIAGNOSIS — E66813 Obesity, class 3: Secondary | ICD-10-CM

## 2021-01-08 DIAGNOSIS — G4733 Obstructive sleep apnea (adult) (pediatric): Secondary | ICD-10-CM | POA: Diagnosis not present

## 2021-01-08 DIAGNOSIS — G43809 Other migraine, not intractable, without status migrainosus: Secondary | ICD-10-CM

## 2021-01-08 MED ORDER — KETOROLAC TROMETHAMINE 60 MG/2ML IM SOLN
60.0000 mg | Freq: Once | INTRAMUSCULAR | Status: AC
  Administered 2021-01-08: 60 mg via INTRAMUSCULAR

## 2021-01-08 MED ORDER — BUSPIRONE HCL 10 MG PO TABS: 10.0000 mg | ORAL_TABLET | Freq: Two times a day (BID) | ORAL | 1 refills | Status: DC

## 2021-01-08 MED ORDER — EMGALITY 120 MG/ML ~~LOC~~ SOAJ: SUBCUTANEOUS | 3 refills | Status: DC

## 2021-01-08 MED ORDER — HYDROCORTISONE-ACETIC ACID 1-2 % OT SOLN: 4.0000 [drp] | Freq: Two times a day (BID) | OTIC | 0 refills | Status: DC

## 2021-01-08 NOTE — Assessment & Plan Note (Signed)
Has had some difficulty with CPAP machine.  Hopefully as we treat her pain she will be able to use CPAP more often.

## 2021-01-08 NOTE — Assessment & Plan Note (Signed)
She has acute migraine today that has been going on for about a week.  Reassuring neurologic exam.  Given her multiple migraines per week would be reasonable to start preventive therapy at this point.  It is also possible that some of her other neurologic symptoms such as the paresthesias and pain could be related to her migraines.  She is already on spironolactone for blood pressure control which is well controlled today-would be hesitant to add on a beta-blocker at this point.  She is on Celexa and BuSpar-would be hesitant to add on TCA such as amitriptyline.  We will treat her acute migraine with 60 mg of Toradol today.  We will start Emgality.  Follow-up in 3 months.  Discussed reasons to return to care.

## 2021-01-08 NOTE — Progress Notes (Signed)
Debra Miller is a 40 y.o. female who presents today for an office visit.  Assessment/Plan:  Chronic Problems Addressed Today: Class 3 severe obesity without serious comorbidity with body mass index (BMI) of 45.0 to 49.9 in adult (HCC) Weight has been stable on Ozempic 1 mg weekly.  She will be following up with bariatric surgery soon.  Migraines She has acute migraine today that has been going on for about a week.  Reassuring neurologic exam.  Given her multiple migraines per week would be reasonable to start preventive therapy at this point.  It is also possible that some of her other neurologic symptoms such as the paresthesias and pain could be related to her migraines.  She is already on spironolactone for blood pressure control which is well controlled today-would be hesitant to add on a beta-blocker at this point.  She is on Celexa and BuSpar-would be hesitant to add on TCA such as amitriptyline.  We will treat her acute migraine with 60 mg of Toradol today.  We will start Emgality.  Follow-up in 3 months.  Discussed reasons to return to care.  Paresthesias   Has had neurologic work-up which was essentially negative.  Could be related to her migraines.  We will treat migraines as above.  OSA (obstructive sleep apnea) Has had some difficulty with CPAP machine.  Hopefully as we treat her pain she will be able to use CPAP more often.  Essential hypertension At goal on spironolactone 200 mg daily.     Subjective:  HPI: She complains of intermittent headache and migraines, and says that has been in bed with it for the past week "sick to her stomach". She has tried using over the counter medications to alleviate it, but it still continues.   She notes the migraine exerting pressure across her whole head, and feeling significant pressure above the right eye, and right side of forehead. This area is she says is also the point of origin for headache/migrane pain and pressure.   She  admits to pain at night and pain during the day as related to the sleep discussion. The pain comes from her back and leg, which prevents her from sleeping restfully. She admits to ~5 headaches/migraines in a usual week. Moreover, she states that she has been having these headaches and migraines since she was 40 years old.  She says that her home reading blood pressure has been around ~150, or lower. Additionally, she states that it is "half and half" on if it is at ~150 or lower.   Also, she expresses interest in an increased dose in depression medication. She notes that her ears have been draining a lot; unsure if it could be due to allergies or another cause.  See A/p for status of chronic conditions.       Objective:  Physical Exam: BP 133/86   Pulse 91   Temp 98.4 F (36.9 C) (Temporal)   Ht 5' 3.5" (1.613 m)   Wt 285 lb 6.4 oz (129.5 kg)   LMP 01/01/2021   SpO2 98%   BMI 49.76 kg/m   Gen: No acute distress, resting comfortably CV: Regular rate and rhythm with no murmurs appreciated Pulm: Normal work of breathing, clear to auscultation bilaterally with no crackles, wheezes, or rhonchi Neuro: Grossly normal, moves all extremities.  Cranial nerves II through XII intact.  Moves all extremities.  Sensation light touch grossly intact throughout. Psych: Normal affect and thought content HEENT:  Otitis Externa  I,Jordan Kelly,acting as a Education administrator for [employer], MD.,have documented all relevant documentation on the behalf of [name], MD,as directed by  [name], MD while in the presence of [name], MD.   Time Spent: 45 minutes of total time was spent on the date of the encounter performing the following actions: chart review prior to seeing the patient including her recent specialist visits, obtaining history, performing a medically necessary exam, counseling on the treatment plan, placing orders, and documenting in our EHR.    Algis Greenhouse. [name], MD 01/08/2021 8:44 AM

## 2021-01-08 NOTE — Patient Instructions (Addendum)
It was very nice to see you today!  Please increase your BuSpar to 10 mg twice daily.  We will also start a monthly migraine medication.  We will give you an injection of Toradol today to break your current migraine.  I will send in a prescription for eardrops for your ears.  I like to see you back in about 3 months.  Please come back to see me sooner if needed.  Take care, Dr [name]  PLEASE NOTE:  If you had any lab tests please let us know if you have not heard back within a few days. You may see your results on mychart before we have a chance to review them but we will give you a call once they are reviewed by [place]. If we ordered any referrals today, please let us know if you have not heard from their office within the next week.   Please try these tips to maintain a healthy lifestyle:  Eat at least 3 REAL meals and 1-2 snacks per day.  Aim for no more than 5 hours between eating.  If you eat breakfast, please do so within one hour of getting up.   Each meal should contain half fruits/vegetables, one quarter protein, and one quarter carbs (no bigger than a computer mouse)  Cut down on sweet beverages. This includes juice, soda, and sweet tea.   Drink at least 1 glass of water with each meal and aim for at least 8 glasses per day  Exercise at least 150 minutes every week.

## 2021-01-08 NOTE — Assessment & Plan Note (Signed)
Weight has been stable on Ozempic 1 mg weekly.  She will be following up with bariatric surgery soon.

## 2021-01-08 NOTE — Assessment & Plan Note (Signed)
At goal on spironolactone 200 mg daily.

## 2021-01-08 NOTE — Assessment & Plan Note (Signed)
Has had neurologic work-up which was essentially negative.  Could be related to her migraines.  We will treat migraines as above.

## 2021-01-11 ENCOUNTER — Telehealth: Payer: Self-pay | Admitting: *Deleted

## 2021-01-11 NOTE — Telephone Encounter (Signed)
PA Case ID: [id number] - Rx #: [id number] Status Sent to [facility] Emgality 120MG /ML auto-injectors (migraine)   Waiting for determination

## 2021-01-11 NOTE — Telephone Encounter (Signed)
As long as you remain covered by your prescription drug plan and there are no changes to your plan benefits, this request is approved from 01/11/2021 to 01/11/2022. Pharmacy notified

## 2021-01-15 ENCOUNTER — Telehealth: Payer: Self-pay

## 2021-01-15 ENCOUNTER — Telehealth: Payer: Self-pay | Admitting: *Deleted

## 2021-01-15 NOTE — Telephone Encounter (Signed)
S/w the pt and she is agreeable to plan of care to see Dr. Johnsie Cancel as a new pt for pre op clearance. I will place records that I was handed today by our chart prep team in Dr. Kyla Balzarine box to have for the appt 01/19/21. Pt thanked me so much for our help in this matter. Per Pam, Dr. Kyla Balzarine nurse to please ask our chart prep team to be sure that we have all records ready for appt 01/19/21. I assured RN I will send a message to the chart prep team and Rose J. CMA. I will send FYI to surgeon pt has appt 01/19/21.

## 2021-01-15 NOTE — Telephone Encounter (Signed)
Left message for the pt to call our office back and confirm appt tentatively with Dr. Johnsie Cancel 01/19/21 @ 11:15 as a NEW PT; pt has been referred to cardiology.   [place] Pre-operative Risk Assessment    Patient Name: Debra Miller  DOB: 10-06-1980 MRN: [id number]  HEARTCARE STAFF:  - IMPORTANT!!!!!! Under Visit Info/Reason for Call, type in Other and utilize the format Clearance MM/DD/YY or Clearance TBD. Do not use dashes or single digits. - Please review there is not already an duplicate clearance open for this procedure. - If request is for dental extraction, please clarify the # of teeth to be extracted. - If the patient is currently at the dentist's office, call Pre-Op Callback Staff (MA/nurse) to input urgent request.  - If the patient is not currently in the dentist office, please route to the Pre-Op pool.  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 TBD  What type of clearance is required (medical clearance vs. Pharmacy clearance to hold med vs. Both)? MEDICAL  Are there any medications that need to be held prior to surgery and how long?  NONE LISTED  Practice name and name of physician performing surgery? [facility]: DR. PAUL JEFFREY STECHSCHULTE  What is the office phone number? [phone]   7.   What is the office fax number? [phone]  8.   Anesthesia type (None, local, MAC, general) ? GENERAL?    Julaine Hua 01/15/2021, 3:22 PM  _________________________________________________________________   (provider comments below)

## 2021-01-15 NOTE — Telephone Encounter (Signed)
NOTES ON FILE FROM [facility] [phone] SENT REFERRAL TO SCHEDULING...Marland KitchenMarland Kitchen

## 2021-01-16 ENCOUNTER — Other Ambulatory Visit: Payer: Self-pay | Admitting: Gastroenterology

## 2021-01-18 LAB — BASIC METABOLIC PANEL
BUN: 20 (ref 4–21)
CO2: 27 — AB (ref 13–22)
Chloride: 102 (ref 99–108)
Creatinine: 0.8 (ref 0.5–1.1)
Glucose: 92
Potassium: 4.1 (ref 3.4–5.3)
Sodium: 138 (ref 137–147)

## 2021-01-18 LAB — IRON,TIBC AND FERRITIN PANEL
Iron: 83
TIBC: 83
UIBC: 18

## 2021-01-18 LAB — LIPID PANEL
Cholesterol: 255 — AB (ref 0–200)
HDL: 45 (ref 35–70)
LDL Cholesterol: 157
Triglycerides: 344 — AB (ref 40–160)

## 2021-01-18 LAB — CBC AND DIFFERENTIAL
HCT: 43 (ref 36–46)
Hemoglobin: 14.6 (ref 12.0–16.0)
Platelets: 276 (ref 150–399)
WBC: 7.4

## 2021-01-18 LAB — CBC: RBC: 4.9 (ref 3.87–5.11)

## 2021-01-18 LAB — HEMOGLOBIN A1C: Hemoglobin A1C: 5.3

## 2021-01-18 LAB — COMPREHENSIVE METABOLIC PANEL
Calcium: 9.8 (ref 8.7–10.7)
GFR calc non Af Amer: 101

## 2021-01-18 LAB — POCT INR: INR: 0.9 (ref 0.9–1.1)

## 2021-01-18 LAB — VITAMIN B12: Vitamin B-12: 329

## 2021-01-18 LAB — VITAMIN D 25 HYDROXY (VIT D DEFICIENCY, FRACTURES): Vit D, 25-Hydroxy: 19

## 2021-01-18 LAB — TSH: TSH: 1.88 (ref 0.41–5.90)

## 2021-01-18 NOTE — Progress Notes (Signed)
CARDIOLOGY CONSULT NOTE       Patient ID: JETT KULZER MRN: [id number] DOB/AGE: 39-Sep-1982 39 y.o.  Admit date: (Not on file) Referring Physician: [name] Primary Physician: Vivi Barrack, MD Primary Cardiologist: New Reason for Consultation: Preoperative Clearance   Active Problems:   * No active hospital problems. *   HPI:  40 y.o. referred by Dr [name] for surgical clearance She needs bariatric surgery. Heavy most of her life. Has 5 children Unable to lose weight consistently with pills or diet /exercise She has PCOS, HTN, HLD and DM-2.  She complained to DR Stechschulte on 01/14/21 of exertional dyspnea and chest pains   Dyspnea is chronic and appears functional No asthma, smoking chronic lung disease or history of CHF Chest pain is very infrequent atypical sharp right sided and not exertional mostly   ROS All other systems reviewed and negative except as noted above  Past Medical History:  Diagnosis Date   Anxiety    Asthma    Atypical mole    Bipolar disorder ([place])    Chicken pox    CIN III (cervical intraepithelial neoplasia grade III) with severe dysplasia 10/2004   Depression    Diabetes mellitus without complication (HCC)    type II   Dizziness    Dysmenorrhea    Eczema    Elevated triglycerides with high cholesterol    Frequent headaches    GERD (gastroesophageal reflux disease)    H/O cold sores    High cholesterol    Hypertension    high blood pressure readings    Migraines    with aura   Obesity    PCOS (polycystic ovarian syndrome)    Seasonal allergies    Urine incontinence    UTI (urinary tract infection)     Family History  Problem Relation Age of Onset   Arthritis Mother    High Cholesterol Mother    Hypertension Mother    Diabetes Mother    Rheum arthritis Mother    Asthma Mother    Hyperlipidemia Mother    Migraines Mother    Thyroid disease Mother    Colon polyps Mother    Arthritis Maternal Grandmother    Breast cancer  Maternal Grandmother    High Cholesterol Maternal Grandmother    Stroke Maternal Grandmother    Hypertension Maternal Grandmother    Migraines Maternal Grandmother    Colon polyps Sister    Colon polyps Maternal Aunt    Colon cancer Neg Hx    Esophageal cancer Neg Hx    Rectal cancer Neg Hx    Stomach cancer Neg Hx     Social History   Socioeconomic History   Marital status: Married    Spouse name: Not on file   Number of children: Not on file   Years of education: Not on file   Highest education level: Not on file  Occupational History   Not on file  Tobacco Use   Smoking status: Never   Smokeless tobacco: Never  Vaping Use   Vaping Use: Never used  Substance and Sexual Activity   Alcohol use: No    Alcohol/week: 0.0 standard drinks   Drug use: No   Sexual activity: Yes    Partners: Male    Birth control/protection: Surgical    Comment: Tubal  Other Topics Concern   Not on file  Social History Narrative   Work or School: homemaker      Home Situation: lives with husband and 5  kids (ages 45-14 in 2014)      Spiritual Beliefs: Christian      Lifestyle: no regular exercise, poor diet, lots of soda            Social Determinants of Health   Financial Resource Strain: Not on file  Food Insecurity: Not on file  Transportation Needs: Not on file  Physical Activity: Not on file  Stress: Not on file  Social Connections: Not on file  Intimate Partner Violence: Not on file    Past Surgical History:  Procedure Laterality Date   [place] N/A 09/22/2020   Procedure: MRI WITH ANESTHESIA CERVICAL SPINE WITHOUT AND BRAIN WITHOUT;  Surgeon: Radiologist, Medication, MD;  Location: [facility];  Service: Radiology;  Laterality: N/A;   TONSILLECTOMY AND ADENOIDECTOMY     TUBAL LIGATION        Current Outpatient Medications:    acetic acid-hydrocortisone (VOSOL-HC) OTIC solution, Place 4 drops into both ears 2 (two) times daily., Disp: 10 mL,  Rfl: 0   albuterol (VENTOLIN HFA) 108 (90 Base) MCG/ACT inhaler, TAKE 2 PUFFS BY MOUTH EVERY 6 HOURS AS NEEDED FOR WHEEZE OR SHORTNESS OF BREATH, Disp: 20.1 each, Rfl: 1   busPIRone (BUSPAR) 10 MG tablet, Take 1 tablet (10 mg total) by mouth 2 (two) times daily., Disp: 180 tablet, Rfl: 1   citalopram (CELEXA) 40 MG tablet, Take 1 tablet (40 mg total) by mouth daily., Disp: 90 tablet, Rfl: 3   dicyclomine (BENTYL) 10 MG capsule, TAKE 2 CAPSULES (20 MG TOTAL) BY MOUTH IN THE MORNING, AT NOON, IN THE EVENING, AND AT BEDTIME., Disp: 240 capsule, Rfl: 1   FLOVENT HFA 110 MCG/ACT inhaler, INHALE 2 PUFFS INTO THE LUNGS TWICE A DAY, Disp: 36 each, Rfl: 3   gabapentin (NEURONTIN) 300 MG capsule, Take 900 mg by mouth 3 (three) times daily., Disp: , Rfl:    Galcanezumab-gnlm (EMGALITY) 120 MG/ML SOAJ, Inject 9ml monthly, Disp: 4 mL, Rfl: 3   metFORMIN (GLUCOPHAGE-XR) 750 MG 24 hr tablet, TAKE 2 TABLETS (1,500 MG TOTAL) BY MOUTH DAILY WITH BREAKFAST., Disp: 180 tablet, Rfl: 1   montelukast (SINGULAIR) 10 MG tablet, TAKE 1 TABLET BY MOUTH EVERYDAY AT BEDTIME, Disp: 90 tablet, Rfl: 1   omeprazole (PRILOSEC) 40 MG capsule, Take 1 capsule (40 mg total) by mouth daily., Disp: 30 capsule, Rfl: 6   PEG-KCl-NaCl-NaSulf-Na Asc-C (PLENVU) 140 g SOLR, Take 140 g by mouth as directed. Manufacturer's coupon Universal coupon code:BIN: P2366821; GROUP: [id number]; PCN: [id number]; ID: [id number]; PAY NO MORE $50, Disp: 1 each, Rfl: 0   Semaglutide, 1 MG/DOSE, (OZEMPIC, 1 MG/DOSE,) 4 MG/3ML SOPN, Inject 1 mg into the skin once a week., Disp: 9 mL, Rfl: 3   spironolactone (ALDACTONE) 100 MG tablet, TAKE 2 TABLETS BY MOUTH EVERY DAY, Disp: 180 tablet, Rfl: 3   spironolactone (ALDACTONE) 50 MG tablet, TAKE 2 TABLETS BY MOUTH EVERY DAY, Disp: 180 tablet, Rfl: 1    Physical Exam: Blood pressure (!) 150/100, pulse 82, height 5' 3.5" (1.613 m), weight 128.4 kg, last menstrual period 01/01/2021.    Affect appropriate Obese female   HEENT: normal Neck supple with no adenopathy JVP normal no bruits no thyromegaly Lungs clear with no wheezing and good diaphragmatic motion Heart:  S1/S2 no murmur, no rub, gallop or click PMI normal Abdomen: benighn, BS positve, no tenderness, no AAA no bruit.  No HSM or HJR Distal pulses intact with no bruits No edema Neuro non-focal  Skin warm and dry No muscular weakness   Labs:   Lab Results  Component Value Date   WBC 8.4 10/08/2020   HGB 13.3 10/08/2020   HCT 39.3 10/08/2020   MCV 86.9 10/08/2020   PLT 221.0 10/08/2020   No results for input(s): NA, K, CL, CO2, BUN, CREATININE, CALCIUM, PROT, BILITOT, ALKPHOS, ALT, AST, GLUCOSE in the last 168 hours.  Invalid input(s): LABALBU Lab Results  Component Value Date   CKTOTAL 75 11/28/2019    Lab Results  Component Value Date   CHOL 200 10/08/2020   CHOL 209 (H) 07/23/2019   CHOL 199 01/29/2013   Lab Results  Component Value Date   HDL 41.10 10/08/2020   HDL 39.10 07/23/2019   HDL 29.70 (L) 01/29/2013   No results found for: [facility] Lab Results  Component Value Date   TRIG (H) 10/08/2020    521.0 Triglyceride is over 400; calculations on Lipids are invalid.   TRIG 319.0 (H) 07/23/2019   TRIG 372.0 (H) 01/29/2013   Lab Results  Component Value Date   CHOLHDL 5 10/08/2020   CHOLHDL 5 07/23/2019   CHOLHDL 7 01/29/2013   Lab Results  Component Value Date   LDLDIRECT 102.0 10/08/2020   LDLDIRECT 126.0 07/23/2019   LDLDIRECT 111.3 01/29/2013      Radiology: No results found.  EKG: SR low voltage poor R wave progression likely from body habitus. 09/22/20    ASSESSMENT AND PLAN:   Preoperative:  given risk factors and symtoms will need TTE to assess EF and r/o elevated PA pressrues As well as coronary calcium score to screen for CAD  DM:  Discussed low carb diet.  Target hemoglobin A1c is 6.5 or less.  Continue current medications. HTN: Well controlled.  Continue current medications and low sodium  Dash type diet.   HLD:  continue statin labs with primary  GERD:  EGD benign continue prilosec OSA:  continue CPAP and flovent   Calcium score  TTE  Clear for bariatric surgery if low risk and PRN cardiology f/u   Signed: Jenkins Rouge 01/19/2021, 1:43 PM

## 2021-01-19 ENCOUNTER — Other Ambulatory Visit: Payer: Self-pay

## 2021-01-19 ENCOUNTER — Ambulatory Visit (INDEPENDENT_AMBULATORY_CARE_PROVIDER_SITE_OTHER): Payer: BC Managed Care – PPO | Admitting: Cardiovascular Disease

## 2021-01-19 ENCOUNTER — Encounter: Payer: Self-pay | Admitting: Cardiovascular Disease

## 2021-01-19 VITALS — BP 150/100 | HR 82 | Ht 63.5 in | Wt 283.0 lb

## 2021-01-19 DIAGNOSIS — I10 Essential (primary) hypertension: Secondary | ICD-10-CM

## 2021-01-19 DIAGNOSIS — Z01810 Encounter for preprocedural cardiovascular examination: Secondary | ICD-10-CM

## 2021-01-19 DIAGNOSIS — Z01818 Encounter for other preprocedural examination: Secondary | ICD-10-CM

## 2021-01-19 DIAGNOSIS — R0600 Dyspnea, unspecified: Secondary | ICD-10-CM | POA: Diagnosis not present

## 2021-01-19 DIAGNOSIS — R079 Chest pain, unspecified: Secondary | ICD-10-CM

## 2021-01-19 NOTE — Patient Instructions (Addendum)
Medication Instructions:  *If you need a refill on your cardiac medications before your next appointment, please call your pharmacy*  Lab Work: If you have labs (blood work) drawn today and your tests are completely normal, you will receive your results only by: [place] (if you have MyChart) OR A paper copy in the mail If you have any lab test that is abnormal or we need to change your treatment, we will call you to review the results.  Testing/Procedures: Cardiac CT scanning for calcium score, (CAT scanning), is a noninvasive, special x-ray that produces cross-sectional images of the body using x-rays and a computer. CT scans help physicians diagnose and treat medical conditions. For some CT exams, a contrast material is used to enhance visibility in the area of the body being studied. CT scans provide greater clarity and reveal more details than regular x-ray exams.   Your physician has requested that you have an echocardiogram. Echocardiography is a painless test that uses sound waves to create images of your heart. It provides your doctor with information about the size and shape of your heart and how well your heart's chambers and valves are working. This procedure takes approximately one hour. There are no restrictions for this procedure.  Follow-Up: At [facility], you and your health needs are our priority.  As part of our continuing mission to provide you with exceptional heart care, we have created designated Provider Care Teams.  These Care Teams include your primary Cardiologist (physician) and Advanced Practice Providers (APPs -  Physician Assistants and Nurse Practitioners) who all work together to provide you with the care you need, when you need it.  We recommend signing up for the patient portal called "MyChart".  Sign up information is provided on this After Visit Summary.  MyChart is used to connect with patients for Virtual Visits (Telemedicine).  Patients are able to  view lab/test results, encounter notes, upcoming appointments, etc.  Non-urgent messages can be sent to your provider as well.   To learn more about what you can do with MyChart, go to [email or web address].    Your next appointment:   As needed  The format for your next appointment:   In Person  Provider:   You may see Dr. Johnsie Cancel or one of the following Advanced Practice Providers on your designated Care Team:   Cecilie Kicks, NP

## 2021-01-27 ENCOUNTER — Encounter: Payer: Self-pay | Admitting: Neurology

## 2021-01-27 ENCOUNTER — Encounter: Payer: Self-pay | Admitting: Family Medicine

## 2021-01-28 ENCOUNTER — Other Ambulatory Visit: Payer: Self-pay

## 2021-01-28 MED ORDER — FLUCONAZOLE 150 MG PO TABS: 150.0000 mg | ORAL_TABLET | Freq: Every day | ORAL | 0 refills | Status: DC

## 2021-01-28 MED ORDER — NITROFURANTOIN MONOHYD MACRO 100 MG PO CAPS: 100.0000 mg | ORAL_CAPSULE | Freq: Two times a day (BID) | ORAL | 0 refills | Status: DC

## 2021-02-08 ENCOUNTER — Other Ambulatory Visit: Payer: Self-pay

## 2021-02-08 ENCOUNTER — Ambulatory Visit (INDEPENDENT_AMBULATORY_CARE_PROVIDER_SITE_OTHER)
Admission: RE | Admit: 2021-02-08 | Discharge: 2021-02-08 | Disposition: A | Discharge status: Home / Self Care | Payer: Self-pay | Source: Ambulatory Visit | Attending: Cardiovascular Disease | Admitting: Cardiovascular Disease

## 2021-02-08 ENCOUNTER — Ambulatory Visit (HOSPITAL_COMMUNITY): Payer: BC Managed Care – PPO | Attending: Cardiology

## 2021-02-08 DIAGNOSIS — Z01818 Encounter for other preprocedural examination: Secondary | ICD-10-CM

## 2021-02-08 DIAGNOSIS — I10 Essential (primary) hypertension: Secondary | ICD-10-CM

## 2021-02-08 DIAGNOSIS — R079 Chest pain, unspecified: Secondary | ICD-10-CM | POA: Diagnosis not present

## 2021-02-08 DIAGNOSIS — Z01810 Encounter for preprocedural cardiovascular examination: Secondary | ICD-10-CM

## 2021-02-08 LAB — ECHOCARDIOGRAM COMPLETE
Area-P 1/2: 2.76 cm2
S' Lateral: 3.1 cm

## 2021-02-20 ENCOUNTER — Other Ambulatory Visit: Payer: Self-pay | Admitting: Gastroenterology

## 2021-02-24 ENCOUNTER — Encounter: Payer: BC Managed Care – PPO | Attending: Surgery | Admitting: Skilled Nursing Facility1

## 2021-02-24 ENCOUNTER — Encounter: Payer: Self-pay | Admitting: Skilled Nursing Facility1

## 2021-02-24 ENCOUNTER — Other Ambulatory Visit: Payer: Self-pay

## 2021-02-24 DIAGNOSIS — Z6841 Body Mass Index (BMI) 40.0 and over, adult: Secondary | ICD-10-CM | POA: Insufficient documentation

## 2021-02-24 DIAGNOSIS — E119 Type 2 diabetes mellitus without complications: Secondary | ICD-10-CM | POA: Insufficient documentation

## 2021-02-24 NOTE — Progress Notes (Signed)
Nutrition Assessment for Bariatric Surgery Medical Nutrition Therapy Appt Start Time: 10:00    End Time: 11:00  Patient was seen on 02/24/2021 for Pre-Operative Nutrition Assessment. Letter of approval faxed to [facility] Surgery bariatric surgery program coordinator on 02/24/2021.   Referral stated Supervised Weight Loss (SWL) visits needed: 0  Not cleared at this time:  Pt to follow up for minimum of one more visit to assist pt with progressing through stages of change/further nutrition education. RD advised pt that this follow up visit is not mandated through insurance. Pt verbalized agreement.   Planned surgery: RYGB Pt expectation of surgery: to be healthy Pt expectation of dietitian: to educate    NUTRITION ASSESSMENT   Anthropometrics  Start weight at [facility]: 283 lbs (date: 02/24/2021)  Height: 63 in BMI: 50.13 kg/m2     Clinical  Medical hx: Type 2 diabetes, hypercholesterolemia, HTN, asthma, sleep apnea, depression, GERD, anxiety, PCOS, Bipolar, asthma Medications: ozempic, metformin   Labs: cholesterol 255, HDL 45, TRIGLYCERIDES 344, VITAMIN D 19, a1c Notable signs/symptoms: migraines 4 a week if not taking shots, diarrhea/constipation daily, acid reflux, constant Bloat, nausea  Any previous deficiencies? No  Micronutrient Nutrition Focused Physical Exam: Hair: No issues observed Eyes: No issues observed Mouth: No issues observed Neck: No issues observed Nails: No issues observed Skin: No issues observed  Lifestyle & Dietary Hx  Pt states she is going to a series of testing trying to figure out what her diagnosis is for her multiple GI issues.  Pt states her GI issues started about 4 years ago.  Pt states she is stressed about her health issues and is feeling anxious because of that.  Pt states once in a week she gets really really hungry but other than that typically does not feel hunger.  Pt states she used to drink a lot of soda but has stopped that  behavior.  Pt states she drinks about 100+ ounces fluid per day.   24-Hr Dietary Recall: up at 7am First Meal: skipped  Snack:  Second Meal: skipped Snack:  Third Meal 4pm: fruit or fast food Snack 5:30-7:30pm: pancake + peanut butter or meatloaf + mashed potatoes + green beans Snack: fruit Beverages: water, water + flavorings, juice, sweet tea   Estimated Energy Needs Calories: 1500   NUTRITION DIAGNOSIS  Overweight/obesity (-3.3) related to past poor dietary habits and physical inactivity as evidenced by patient w/ planned RYGB surgery following dietary guidelines for continued weight loss.    NUTRITION INTERVENTION  Nutrition counseling (C-1) and education (E-2) to facilitate bariatric surgery goals.  Educated pt on micronutrient deficiencies post surgery and strategies to mitigate that risk   Pre-Op Goals Reviewed with the Patient Track food and beverage intake (pen and paper, MyFitness Pal, Baritastic app, etc.) Make healthy food choices while monitoring portion sizes Consume 3 meals per day or try to eat every 3-5 hours: without eating fast food; avoid overeating at night to feel hunger in the morning; limit fruit 2 3 servings p[er day Avoid concentrated sugars and fried foods Keep sugar & fat in the single digits per serving on food labels Practice CHEWING your food (aim for applesauce consistency) Practice not drinking 15 minutes before, during, and 30 minutes after each meal and snack Avoid all carbonated beverages (ex: soda, sparkling beverages)  Limit caffeinated beverages (ex: coffee, tea, energy drinks) Avoid all sugar-sweetened beverages (ex: regular soda, sports drinks)  Avoid alcohol  Aim for 64-100 ounces of FLUID daily (with at least half of  fluid intake being plain water)  Aim for at least 60-80 grams of PROTEIN daily Look for a liquid protein source that contains ?15 g protein and ?5 g carbohydrate (ex: shakes, drinks, shots) Make a list of non-food  related activities Physical activity is an important part of a healthy lifestyle so keep it moving! The goal is to reach 150 minutes of exercise per week, including cardiovascular and weight baring activity.  *Goals that are bolded indicate the pt would like to start working towards these  Handouts Provided Include  Bariatric Surgery handouts (Nutrition Visits, Pre-Op Goals, Protein Shakes, Vitamins & Minerals)  Learning Style & Readiness for Change Teaching method utilized: Visual & Auditory  Demonstrated degree of understanding via: Teach Back  Readiness Level: contemplative Barriers to learning/adherence to lifestyle change: not feeling hunger/GI issues  RD's Notes for Next Visit Assess pts adherence to chosen goals     MONITORING & EVALUATION Dietary intake, weekly physical activity, body weight, and pre-op goals reached at next nutrition visit.    Next Steps  Patient is to follow up at [facility] for Pre-Op Class >2 weeks before surgery for further nutrition education.  Return for next appt

## 2021-02-25 ENCOUNTER — Encounter: Payer: Self-pay | Admitting: Family Medicine

## 2021-02-26 NOTE — Telephone Encounter (Signed)
See note, please advise. 

## 2021-03-04 ENCOUNTER — Encounter: Payer: BC Managed Care – PPO | Attending: Surgery | Admitting: Skilled Nursing Facility1

## 2021-03-04 ENCOUNTER — Other Ambulatory Visit: Payer: Self-pay

## 2021-03-04 DIAGNOSIS — E669 Obesity, unspecified: Secondary | ICD-10-CM | POA: Insufficient documentation

## 2021-03-04 NOTE — Progress Notes (Signed)
Supervised Weight Loss Visit Bariatric Nutrition Education  NUTRITION ASSESSMENT  Anthropometrics  Start weight at [facility]: 283 lbs (date: 02/24/2021) Today's weight: 288 lbs BMI: 51.02 kg/m2    Clinical  Medical hx: Type 2 diabetes, hypercholesterolemia, HTN, asthma, sleep apnea, depression, GERD, anxiety, PCOS, Bipolar, asthma Medications: ozempic, metformin   Labs: cholesterol 255, HDL 45, TRIGLYCERIDES 344, VITAMIN D 19, a1c Notable signs/symptoms: migraines 4 a week if not taking shots, diarrhea/constipation daily, acid reflux, constant Bloat, nausea  Any previous deficiencies? No  Lifestyle & Dietary Hx  Pt states she does not know where the 5 pound weight gain came from.  Pt states it has been very difficult getting on a routine of eating throughout the day.  Pt states she did log her food which she found helpful.  Pt states she started taking a vitamin D supplement.  Pt state she does struggle with drinking while eating and controlling hunger at night realizing anxiety probably has some to do with it.  Pt states she realized in this last couple weeks she has neglected eating her foods.   Pt states she recognizes she needs to work on her emotional anxious eating.   Pt states she knows she needs to work on consistently eating throughout the day and not drinking with meals.   Estimated daily fluid intake: 100 oz Supplements: vitamin D Current average weekly physical activity: ADL's  24-Hr Dietary Recall First Meal: yogurt + grapes or egg muffin  Snack: Second Meal: pickles or chicken kabobs Snack:  Third Meal: chicken rice bowl or fajita Snack:  Beverages: water, water + flavorings  Estimated Energy Needs Calories: 1500   NUTRITION DIAGNOSIS  Overweight/obesity ([place]-3.3) related to past poor dietary habits and physical inactivity as evidenced by patient w/ planned RYGB surgery following dietary guidelines for continued weight loss.   NUTRITION INTERVENTION   Nutrition counseling (C-1) and education (E-2) to facilitate bariatric surgery goals.  Pre-Op Goals Progress & New Goals Continue: Consume 3 meals per day or try to eat every 3-5 hours: without eating fast food; avoid overeating at night to feel hunger in the morning; limit fruit 2 3 servings per day Continue: Practice CHEWING your food (aim for applesauce consistency) Continue: Practice not drinking 15 minutes before, during, and 30 minutes after each meal and snack  Handouts Provided Include  N/A  Learning Style & Readiness for Change Teaching method utilized: Visual & Auditory  Demonstrated degree of understanding via: Teach Back  Readiness Level: contemplative/action Barriers to learning/adherence to lifestyle change: GI issues    MONITORING & EVALUATION Dietary intake, weekly physical activity, body weight, and pre-op goals in 1 month.   Next Steps  Patient is to return to [facility] for pre-op class

## 2021-03-05 ENCOUNTER — Other Ambulatory Visit: Payer: Self-pay | Admitting: *Deleted

## 2021-03-05 MED ORDER — FLUCONAZOLE 150 MG PO TABS: 150.0000 mg | ORAL_TABLET | Freq: Once | ORAL | 0 refills | Status: AC

## 2021-03-22 ENCOUNTER — Encounter: Payer: Self-pay | Admitting: Adult Health

## 2021-03-22 ENCOUNTER — Ambulatory Visit: Payer: BC Managed Care – PPO | Admitting: Adult Health

## 2021-03-26 ENCOUNTER — Encounter: Payer: Self-pay | Admitting: Family Medicine

## 2021-03-26 NOTE — Telephone Encounter (Signed)
Labs abstracted  See note

## 2021-03-30 ENCOUNTER — Other Ambulatory Visit: Payer: Self-pay | Admitting: *Deleted

## 2021-03-30 MED ORDER — VITAMIN D (ERGOCALCIFEROL) 1.25 MG (50000 UNIT) PO CAPS
50000.0000 [IU] | ORAL_CAPSULE | ORAL | 0 refills | Status: DC
Start: 2021-03-30 — End: 2021-05-14

## 2021-04-01 ENCOUNTER — Ambulatory Visit: Payer: BC Managed Care – PPO | Admitting: Neurology

## 2021-04-08 ENCOUNTER — Other Ambulatory Visit: Payer: Self-pay | Admitting: Pulmonary Disease

## 2021-04-12 ENCOUNTER — Other Ambulatory Visit: Payer: Self-pay

## 2021-04-12 ENCOUNTER — Encounter: Payer: BC Managed Care – PPO | Attending: Surgery | Admitting: Skilled Nursing Facility1

## 2021-04-12 DIAGNOSIS — E669 Obesity, unspecified: Secondary | ICD-10-CM | POA: Diagnosis not present

## 2021-04-12 NOTE — Progress Notes (Signed)
Pre-Operative Nutrition Class:    Patient was seen on 04/12/2021 for Pre-Operative Bariatric Surgery Education [facility]    Surgery date:  Surgery type: RYGB Start weight at [facility]: 283 Weight today: 288.9 pounds   The following the learning objectives were met by the patient during this course: Identify Pre-Op Dietary Goals and will begin 2 weeks pre-operatively Identify appropriate sources of fluids and proteins  State protein recommendations and appropriate sources pre and post-operatively Identify Post-Operative Dietary Goals and will follow for 2 weeks post-operatively Identify appropriate multivitamin and calcium sources Describe the need for physical activity post-operatively and will follow MD recommendations State when to call healthcare provider regarding medication questions or post-operative complications When having a diagnosis of diabetes understanding hypoglycemia symptoms and the inclusion of 1 complex carbohydrate per meal  Handouts given during class include: Pre-Op Bariatric Surgery Diet Handout Protein Shake Handout Post-Op Bariatric Surgery Nutrition Handout [facility] Program Information Flyer Support Group Information Flyer [facility] Bariatric Supplements Price List  Follow-Up Plan: Patient will follow-up at [facility] 2 weeks post operatively for diet advancement per MD.

## 2021-04-13 ENCOUNTER — Ambulatory Visit (INDEPENDENT_AMBULATORY_CARE_PROVIDER_SITE_OTHER): Payer: BC Managed Care – PPO | Admitting: Family Medicine

## 2021-04-13 ENCOUNTER — Other Ambulatory Visit: Payer: Self-pay

## 2021-04-13 ENCOUNTER — Encounter: Payer: Self-pay | Admitting: Family Medicine

## 2021-04-13 VITALS — BP 159/81 | HR 67 | Temp 98.5°F | Ht 63.0 in | Wt 288.2 lb

## 2021-04-13 DIAGNOSIS — R7989 Other specified abnormal findings of blood chemistry: Secondary | ICD-10-CM

## 2021-04-13 DIAGNOSIS — E538 Deficiency of other specified B group vitamins: Secondary | ICD-10-CM | POA: Diagnosis not present

## 2021-04-13 DIAGNOSIS — K219 Gastro-esophageal reflux disease without esophagitis: Secondary | ICD-10-CM | POA: Insufficient documentation

## 2021-04-13 DIAGNOSIS — E611 Iron deficiency: Secondary | ICD-10-CM | POA: Insufficient documentation

## 2021-04-13 DIAGNOSIS — I10 Essential (primary) hypertension: Secondary | ICD-10-CM | POA: Diagnosis not present

## 2021-04-13 DIAGNOSIS — M5416 Radiculopathy, lumbar region: Secondary | ICD-10-CM

## 2021-04-13 DIAGNOSIS — G43809 Other migraine, not intractable, without status migrainosus: Secondary | ICD-10-CM | POA: Diagnosis not present

## 2021-04-13 DIAGNOSIS — G1220 Motor neuron disease, unspecified: Secondary | ICD-10-CM

## 2021-04-13 DIAGNOSIS — Z6841 Body Mass Index (BMI) 40.0 and over, adult: Secondary | ICD-10-CM

## 2021-04-13 DIAGNOSIS — R202 Paresthesia of skin: Secondary | ICD-10-CM

## 2021-04-13 DIAGNOSIS — Z23 Encounter for immunization: Secondary | ICD-10-CM

## 2021-04-13 MED ORDER — CYANOCOBALAMIN 1000 MCG/ML IJ SOLN
1000.0000 ug | Freq: Once | INTRAMUSCULAR | Status: AC
  Administered 2021-04-13: 1000 ug via INTRAMUSCULAR

## 2021-04-13 MED ORDER — SUMATRIPTAN SUCCINATE 50 MG PO TABS: 50.0000 mg | ORAL_TABLET | ORAL | 0 refills | Status: DC | PRN

## 2021-04-13 NOTE — Progress Notes (Signed)
Debra Miller is a 40 y.o. female who presents today for an office visit.  Assessment/Plan:  Chronic Problems Addressed Today: Migraines She is doing much better on Emgality.  She has been taking ibuprofen as needed for abortive therapy though has upcoming gastric bypass surgery and will no longer be able to take NSAIDs.  We will send in Imitrex to use as needed.  Follow-up again in 3 to 6 months.  B12 deficiency Her B12 level have been on the low side of normal range.  This could be contributing somewhat to her paresthesias.  She would like to start B12 replacement.  She has upcoming gastric bypass.  We will start parenteral replacement with B12 injections.  We can recheck in 3 to 6 months.  Iron deficiency She is on ferrous sulfate 65 mg every other day.  We can recheck again in 3 to 6 months.  Paresthesias She has had cervical spine and MRI brain about 6 months ago which did not show any obvious abnormalities.  She is having sensation of weakness and difficulty walking.  Concern for possible motor neuron disease.  We will check small thoracic and lumbar spine to complete CNS imaging work-up.  Will likely need to follow back up with sports medicine or neurology depending on results.  Essential hypertension Above goal though this typically been well controlled on spironolactone 200 mg daily.  She has upcoming gastric bypass surgery which hopefully should help some with her blood pressure control.  We will continue current regimen and she will follow-up with me after her bypass surgery.  Lumbar radiculopathy She is on gabapentin 900 mg 3 times daily.  Still has persistent paresthesias and bilateral leg weakness.  We will check MR lumbar spines above.  Low vitamin D level On vit D 50,000 IUs weekly.  We can recheck vitamin D in 3 to 6 months.  Class 3 severe obesity without serious comorbidity with body mass index (BMI) of 45.0 to 49.9 in adult [facility]) Follows with bariatric surgery.  Will  be having gastric bypass next month.  We will will likely be able to stop metformin and Ozempic  GERD (gastroesophageal reflux disease) On Prilosec 40 mg daily.  We will stop after her bypass surgery.  Flu shot given today.     Subjective:  HPI:  She is here to follow up. She was last seen 3 months ago. We started her on Emgality for acute migraine. She has seen improvement with Emgality. She also takes Ibuprofen for her migraines. Since our last visit she was also seen by surgeon Dr Thermon Leyland.Eddie Dibbles. She will going under robotic gastric bypass in November.   She complain of body aches. This has been on going issue for a year. She reports symptoms seems to be getting worse. Her legs, arms  and hands hurt. She notes her feet feel numb. Hurts when walking. She also have some back pain. She is currently taking Gabapentin. She noticed mild relief with the medication. Associated symptoms are weakness in lower extremities. Seems to be getting worse. Sometimes has difficulty with walking.Marland Kitchen She would to get MRI done for this issue.   Also, she complain of yeats infection. This gets worse when she take antibiotics. Her blood pressure at office was elevated. She is currently on Aldactone.   See A/p for status of chronic conditions.         Objective:  Physical Exam: BP (!) 159/81   Pulse 67   Temp 98.5 F (36.9 C) (Temporal)  Ht 5\' 3"  (1.6 m)   Wt 288 lb 3.2 oz (130.7 kg)   LMP 04/08/2021   SpO2 98%   BMI 51.05 kg/m   Gen: No acute distress, resting comfortably CV: Regular rate and rhythm with no murmurs appreciated Pulm: Normal work of breathing, clear to auscultation bilaterally with no crackles, wheezes, or rhonchi Neuro: Grossly normal, moves all extremities Psych: Normal affect and thought content       I,Savera Zaman,acting as a scribe for [name], MD.,have documented all relevant documentation on the behalf of [name], MD,as directed by  [name], MD while in  the presence of [name], MD.   I, [name], MD, have reviewed all documentation for this visit. The documentation on 04/13/21 for the exam, diagnosis, procedures, and orders are all accurate and complete.  Time Spent: 43 minutes of total time was spent on the date of the encounter performing the following actions: chart review prior to seeing the patient including recent visits with cardiology and surgery, obtaining history, performing a medically necessary exam, counseling on the treatment plan, placing orders, and documenting in our EHR.    Algis Greenhouse. [name], MD 04/13/2021 9:33 AM

## 2021-04-13 NOTE — Assessment & Plan Note (Signed)
On vit D 50,000 IUs weekly.  We can recheck vitamin D in 3 to 6 months.

## 2021-04-13 NOTE — Assessment & Plan Note (Addendum)
Follows with bariatric surgery.  Will be having gastric bypass next month.  We will will likely be able to stop metformin and Ozempic

## 2021-04-13 NOTE — Patient Instructions (Addendum)
It was very nice to see you today!  We will check an MRI of your back. You will get called to schedule this.  We will start B12 shots today.  We can recheck your B12, iron, and vitamin D at your next office visit.  We will give you a flu shot today.  Please use Imitrex as needed for migraines.  We will likely be able to stop several medications after surgery.  Take care, Dr [name]  PLEASE NOTE:  If you had any lab tests please let us know if you have not heard back within a few days. You may see your results on mychart before we have a chance to review them but we will give you a call once they are reviewed by [place]. If we ordered any referrals today, please let us know if you have not heard from their office within the next week.   Please try these tips to maintain a healthy lifestyle:  Eat at least 3 REAL meals and 1-2 snacks per day.  Aim for no more than 5 hours between eating.  If you eat breakfast, please do so within one hour of getting up.   Each meal should contain half fruits/vegetables, one quarter protein, and one quarter carbs (no bigger than a computer mouse)  Cut down on sweet beverages. This includes juice, soda, and sweet tea.   Drink at least 1 glass of water with each meal and aim for at least 8 glasses per day  Exercise at least 150 minutes every week.

## 2021-04-13 NOTE — Assessment & Plan Note (Signed)
Above goal though this typically been well controlled on spironolactone 200 mg daily.  She has upcoming gastric bypass surgery which hopefully should help some with her blood pressure control.  We will continue current regimen and she will follow-up with me after her bypass surgery.

## 2021-04-13 NOTE — Assessment & Plan Note (Signed)
She is on gabapentin 900 mg 3 times daily.  Still has persistent paresthesias and bilateral leg weakness.  We will check MR lumbar spines above.

## 2021-04-13 NOTE — Assessment & Plan Note (Signed)
Her B12 level have been on the low side of normal range.  This could be contributing somewhat to her paresthesias.  She would like to start B12 replacement.  She has upcoming gastric bypass.  We will start parenteral replacement with B12 injections.  We can recheck in 3 to 6 months.

## 2021-04-13 NOTE — Assessment & Plan Note (Signed)
She is doing much better on Emgality.  She has been taking ibuprofen as needed for abortive therapy though has upcoming gastric bypass surgery and will no longer be able to take NSAIDs.  We will send in Imitrex to use as needed.  Follow-up again in 3 to 6 months.

## 2021-04-13 NOTE — Assessment & Plan Note (Signed)
She has had cervical spine and MRI brain about 6 months ago which did not show any obvious abnormalities.  She is having sensation of weakness and difficulty walking.  Concern for possible motor neuron disease.  We will check small thoracic and lumbar spine to complete CNS imaging work-up.  Will likely need to follow back up with sports medicine or neurology depending on results.

## 2021-04-13 NOTE — Assessment & Plan Note (Signed)
She is on ferrous sulfate 65 mg every other day.  We can recheck again in 3 to 6 months.

## 2021-04-13 NOTE — Assessment & Plan Note (Signed)
On Prilosec 40 mg daily.  We will stop after her bypass surgery.

## 2021-04-22 ENCOUNTER — Other Ambulatory Visit: Payer: Self-pay

## 2021-04-22 ENCOUNTER — Ambulatory Visit (INDEPENDENT_AMBULATORY_CARE_PROVIDER_SITE_OTHER): Payer: BC Managed Care – PPO

## 2021-04-22 DIAGNOSIS — E538 Deficiency of other specified B group vitamins: Secondary | ICD-10-CM

## 2021-04-22 MED ORDER — CYANOCOBALAMIN 1000 MCG/ML IJ SOLN
1000.0000 ug | Freq: Once | INTRAMUSCULAR | Status: AC
  Administered 2021-04-22: 1000 ug via INTRAMUSCULAR

## 2021-04-22 NOTE — Progress Notes (Signed)
Debra Miller 40 yr old female presents to office today for 2nd of 4 weekly B12 injections per [name], MD. Administered CYANOCOBALAMIN 1,000 mcg IM right arm. Patient tolerated well.

## 2021-04-29 ENCOUNTER — Ambulatory Visit: Payer: BC Managed Care – PPO

## 2021-05-04 HISTORY — PX: ROUX-EN-Y GASTRIC BYPASS: SHX1104

## 2021-05-05 ENCOUNTER — Ambulatory Visit (INDEPENDENT_AMBULATORY_CARE_PROVIDER_SITE_OTHER): Payer: BC Managed Care – PPO

## 2021-05-05 ENCOUNTER — Telehealth: Payer: Self-pay

## 2021-05-05 ENCOUNTER — Other Ambulatory Visit: Payer: Self-pay

## 2021-05-05 DIAGNOSIS — E538 Deficiency of other specified B group vitamins: Secondary | ICD-10-CM

## 2021-05-05 MED ORDER — CYANOCOBALAMIN 1000 MCG/ML IJ SOLN
1000.0000 ug | Freq: Once | INTRAMUSCULAR | Status: AC
  Administered 2021-05-05: 1000 ug via INTRAMUSCULAR

## 2021-05-05 NOTE — Progress Notes (Signed)
Per orders of Dr. [name], injection of B-12 given by Tobe Sos in right deltoid. Patient tolerated injection well. Patient will make appointment for 1 week.

## 2021-05-05 NOTE — Telephone Encounter (Signed)
Patient is calling in stating that she was given a number to schedule her MRI but when she called they told [place] that they needed more information from [place] and a fax number.

## 2021-05-06 ENCOUNTER — Telehealth: Payer: Self-pay | Admitting: Gastroenterology

## 2021-05-06 ENCOUNTER — Ambulatory Visit: Payer: BC Managed Care – PPO

## 2021-05-06 NOTE — Telephone Encounter (Signed)
Thank you. Will await pt returned call after she has spoken to her surgeon. We remain available to her surgeon should they need our assistance.

## 2021-05-06 NOTE — Telephone Encounter (Signed)
Can we check with patient or imaging center what needs to be done?  Debra Miller. [name], MD 05/06/2021 7:59 AM

## 2021-05-06 NOTE — Telephone Encounter (Signed)
Patient came in to reschedule colonoscopy but patient bmi is over 36 so she needs to be scheduled at the hospital. Patient is going to have gastric bypass surgery in late November. She will contact the surgeon that will be doing her surgery to see if she can wait a few more months to have colonoscopy. Patient will contact our office once she has spoken to the surgeon

## 2021-05-06 NOTE — Telephone Encounter (Signed)
Left message to return call to our office at their convenience.  

## 2021-05-10 ENCOUNTER — Other Ambulatory Visit: Payer: Self-pay | Admitting: Family Medicine

## 2021-05-10 NOTE — Telephone Encounter (Signed)
Form faxed to [phone] today

## 2021-05-10 NOTE — Telephone Encounter (Signed)
Sacia from radiology called regarding the MRI. She stated that they are needing an update HMP form from the pt. She can be reached at [phone]. Please Advise.

## 2021-05-10 NOTE — Telephone Encounter (Signed)
Spoke with Mayford Knife, requesting [place] form to be completed and fax  Mayford Knife will faxed form today

## 2021-05-13 ENCOUNTER — Other Ambulatory Visit: Payer: Self-pay

## 2021-05-13 ENCOUNTER — Ambulatory Visit (INDEPENDENT_AMBULATORY_CARE_PROVIDER_SITE_OTHER): Payer: BC Managed Care – PPO | Admitting: *Deleted

## 2021-05-13 ENCOUNTER — Ambulatory Visit: Payer: BC Managed Care – PPO

## 2021-05-13 ENCOUNTER — Ambulatory Visit: Payer: Self-pay | Admitting: Surgery

## 2021-05-13 DIAGNOSIS — E538 Deficiency of other specified B group vitamins: Secondary | ICD-10-CM | POA: Diagnosis not present

## 2021-05-13 DIAGNOSIS — Z01818 Encounter for other preprocedural examination: Secondary | ICD-10-CM

## 2021-05-13 MED ORDER — CYANOCOBALAMIN 1000 MCG/ML IJ SOLN
1000.0000 ug | Freq: Once | INTRAMUSCULAR | Status: AC
  Administered 2021-05-13: 1000 ug via INTRAMUSCULAR

## 2021-05-13 NOTE — Progress Notes (Signed)
Per orders of Dr. [name], injection of Cyanocobalamin 1000 mcg/ml given IM by Anselmo Pickler, LPN in left deltoid. Patient tolerated injection well. Patient will make appointment for 1 month.

## 2021-05-13 NOTE — Progress Notes (Signed)
Surgery orders requested with Dr. Stechschulte's office. 

## 2021-05-13 NOTE — Patient Instructions (Addendum)
DUE TO COVID-19 ONLY ONE VISITOR IS ALLOWED TO COME WITH YOU AND STAY IN THE WAITING ROOM ONLY DURING PRE OP AND PROCEDURE.   **NO VISITORS ARE ALLOWED IN THE SHORT STAY AREA OR RECOVERY ROOM!!**  IF YOU WILL BE ADMITTED INTO THE HOSPITAL YOU ARE ALLOWED ONLY TWO SUPPORT PEOPLE DURING VISITATION HOURS ONLY (7 AM -8PM)    Up to two visitors ages 55+ are allowed at one time in a patient's room.  The visitors may rotate out with other people throughout the day.  Additionally, up to two children between the ages of 49 and 78 are allowed and do not count toward the number of allowed visitors.  Children within this age range must be accompanied by an adult visitor.  One adult visitor may remain with the patient overnight and must be in the room by 8 PM.  COVID SWAB TESTING MUST BE COMPLETED ON: Thursday 05-20-21 Between the hours of 8 and 3  **MUST PRESENT COMPLETED FORM AT TESTING SITE**    [facility] [place] (backside of the building)  You are not required to quarantine, however you are required to wear a well-fitted mask when you are out and around people not in your household.  Hand Hygiene often Do NOT share personal items Notify your provider if you are in close contact with someone who has COVID or you develop fever 100.4 or greater, new onset of sneezing, cough, sore throat, shortness of breath or body aches.    Your procedure is scheduled on:  Monday, 05-24-21   Report to [facility] Main  Entrance     Report to admitting at 9:00 AM   Call this number if you have problems the morning of surgery [phone]   Do not eat solid food :After 6:00 PM   May have liquids until 8:15 AM day of surgery  CLEAR LIQUID DIET  Foods Allowed                                                                     Foods Excluded  Water, Black Coffee (no milk/no creamer) and tea, regular and decaf                              liquids that you cannot  Plain Jell-O in any  flavor  (No red)                         see through such as: Fruit ices (not with fruit pulp)                                 milk, soups, orange juice  Iced Popsicles (No red)                                    All solid food                             Apple juices Sports drinks like  Gatorade (No red) Lightly seasoned clear broth or consume(fat free) Sugar Sample Menu Breakfast                                Lunch                                     Supper Cranberry juice                    Beef broth                            Chicken broth Jell-O                                     Grape juice                           Apple juice Coffee or tea                        Jell-O                                      Popsicle                                                Coffee or tea                        Coffee or tea      Complete one G2 drink the morning of surgery at       the day of surgery.     The day of surgery:  Drink ONE (1) Pre-Surgery G2  the morning of surgery. Drink in one sitting. Do not sip.  This drink was given to you during your hospital  pre-op appointment visit. Nothing else to drink after completing the Pre-Surgery G2.          If you have questions, please contact your surgeon's office.     Oral Hygiene is also important to reduce your risk of infection.                                    Remember - BRUSH YOUR TEETH THE MORNING OF SURGERY WITH YOUR REGULAR TOOTHPASTE   Do NOT smoke after Midnight   Take these medicines the morning of surgery with A SIP OF WATER:  Buspar, Citalopram, Gabapentin, Omeprazole.  Okay to use inhalers  How to Manage Your Diabetes Before and After Surgery  Why is it important to control my blood sugar before and after surgery? Improving blood sugar levels before and after surgery helps healing and can limit problems. A way of improving blood sugar control is eating a healthy diet by:  Eating less sugar and carbohydrates   Increasing activity/exercise  Talking with your doctor about reaching your blood sugar goals  High blood sugars (greater than 180 mg/dL) can raise your risk of infections and slow your recovery, so you will need to focus on controlling your diabetes during the weeks before surgery. Make sure that the doctor who takes care of your diabetes knows about your planned surgery including the date and location.  How do I manage my blood sugar before surgery? Check your blood sugar at least 4 times a day, starting 2 days before surgery, to make sure that the level is not too high or low. Check your blood sugar the morning of your surgery when you wake up and every 2 hours until you get to the Short Stay unit. If your blood sugar is less than 70 mg/dL, you will need to treat for low blood sugar: Do not take insulin. Treat a low blood sugar (less than 70 mg/dL) with  cup of clear juice (cranberry or apple), 4 glucose tablets, OR glucose gel. Recheck blood sugar in 15 minutes after treatment (to make sure it is greater than 70 mg/dL). If your blood sugar is not greater than 70 mg/dL on recheck, call [phone] for further instructions. Report your blood sugar to the short stay nurse when you get to Short Stay.  If you are admitted to the hospital after surgery: Your blood sugar will be checked by the staff and you will probably be given insulin after surgery (instead of oral diabetes medicines) to make sure you have good blood sugar levels. The goal for blood sugar control after surgery is 80-180 mg/dL.   WHAT DO I DO ABOUT MY DIABETES MEDICATION?  Do not take oral diabetes medicines (pills) the morning of surgery.  THE NIGHT BEFORE SURGERY: Take Metformin as prescribed.       THE MORNING OF SURGERY:  Do not take Metformin or Ozempic.    Reviewed and Endorsed by [facility] Patient Education Committee, August 2015    Stop all vitamins and herbal supplements a week before surgery   Bring CPAP  mask and tubing day of surgery             You may not have any metal on your body including hair pins, jewelry, and body piercing             Do not wear make-up, lotions, powders, perfumes or deodorant  Do not wear nail polish including gel and S&S, artificial/acrylic nails, or any other type of covering on natural nails including finger and toenails. If you have artificial nails, gel coating, etc. that needs to be removed by a nail salon please have this removed prior to surgery or surgery may need to be canceled/ delayed if the surgeon/ anesthesia feels like they are unable to be safely monitored.   Do not shave  48 hours prior to surgery.        Do not bring valuables to the hospital. [place].   Contacts, dentures or bridgework may not be worn into surgery.   Bring small overnight bag day of surgery.   Special Instructions: Bring a copy of your healthcare power of attorney and living will documents the day of surgery if you haven't scanned them in before.  Please read over the following fact sheets you were given: IF YOU HAVE QUESTIONS ABOUT YOUR PRE OP INSTRUCTIONS PLEASE CALL [phone]   [place] - Preparing for Surgery Before surgery, you can play an important role.  Because skin is not sterile, your skin needs to be as  free of germs as possible.  You can reduce the number of germs on your skin by washing with CHG (chlorahexidine gluconate) soap before surgery.  CHG is an antiseptic cleaner which kills germs and bonds with the skin to continue killing germs even after washing. Please DO NOT use if you have an allergy to CHG or antibacterial soaps.  If your skin becomes reddened/irritated stop using the CHG and inform your nurse when you arrive at Short Stay. Do not shave (including legs and underarms) for at least 48 hours prior to the first CHG shower.  You may shave your face/neck.  Please follow these instructions carefully:  1.  Shower  with CHG Soap the night before surgery and the  morning of surgery.  2.  If you choose to wash your hair, wash your hair first as usual with your normal  shampoo.  3.  After you shampoo, rinse your hair and body thoroughly to remove the shampoo.                             4.  Use CHG as you would any other liquid soap.  You can apply chg directly to the skin and wash.  Gently with a scrungie or clean washcloth.  5.  Apply the CHG Soap to your body ONLY FROM THE NECK DOWN.   Do   not use on face/ open                           Wound or open sores. Avoid contact with eyes, ears mouth and   genitals (private parts).                       Wash face,  Genitals (private parts) with your normal soap.             6.  Wash thoroughly, paying special attention to the area where your    surgery  will be performed.  7.  Thoroughly rinse your body with warm water from the neck down.  8.  DO NOT shower/wash with your normal soap after using and rinsing off the CHG Soap.                9.  Pat yourself dry with a clean towel.            10.  Wear clean pajamas.            11.  Place clean sheets on your bed the night of your first shower and do not  sleep with pets. Day of Surgery : Do not apply any lotions/deodorants the morning of surgery.  Please wear clean clothes to the hospital/surgery center.  FAILURE TO FOLLOW THESE INSTRUCTIONS MAY RESULT IN THE CANCELLATION OF YOUR SURGERY  PATIENT SIGNATURE_________________________________  NURSE SIGNATURE__________________________________  ________________________________________________________________________     Adam Phenix  An incentive spirometer is a tool that can help keep your lungs clear and active. This tool measures how well you are filling your lungs with each breath. Taking long deep breaths may help reverse or decrease the chance of developing breathing (pulmonary) problems (especially infection) following: A long period of time when  you are unable to move or be active. BEFORE THE PROCEDURE  If the spirometer includes an indicator to show your best effort, your nurse or respiratory therapist will set it to a desired goal. If possible,  sit up straight or lean slightly forward. Try not to slouch. Hold the incentive spirometer in an upright position. INSTRUCTIONS FOR USE  Sit on the edge of your bed if possible, or sit up as far as you can in bed or on a chair. Hold the incentive spirometer in an upright position. Breathe out normally. Place the mouthpiece in your mouth and seal your lips tightly around it. Breathe in slowly and as deeply as possible, raising the piston or the ball toward the top of the column. Hold your breath for 3-5 seconds or for as long as possible. Allow the piston or ball to fall to the bottom of the column. Remove the mouthpiece from your mouth and breathe out normally. Rest for a few seconds and repeat Steps 1 through 7 at least 10 times every 1-2 hours when you are awake. Take your time and take a few normal breaths between deep breaths. The spirometer may include an indicator to show your best effort. Use the indicator as a goal to work toward during each repetition. After each set of 10 deep breaths, practice coughing to be sure your lungs are clear. If you have an incision (the cut made at the time of surgery), support your incision when coughing by placing a pillow or rolled up towels firmly against it. Once you are able to get out of bed, walk around indoors and cough well. You may stop using the incentive spirometer when instructed by your caregiver.  RISKS AND COMPLICATIONS Take your time so you do not get dizzy or light-headed. If you are in pain, you may need to take or ask for pain medication before doing incentive spirometry. It is harder to take a deep breath if you are having pain. AFTER USE Rest and breathe slowly and easily. It can be helpful to keep track of a log of your progress.  Your caregiver can provide you with a simple table to help with this. If you are using the spirometer at home, follow these instructions: [place] IF:  You are having difficultly using the spirometer. You have trouble using the spirometer as often as instructed. Your pain medication is not giving enough relief while using the spirometer. You develop fever of 100.5 F (38.1 C) or higher. SEEK IMMEDIATE MEDICAL CARE IF:  You cough up bloody sputum that had not been present before. You develop fever of 102 F (38.9 C) or greater. You develop worsening pain at or near the incision site. MAKE SURE YOU:  Understand these instructions. Will watch your condition. Will get help right away if you are not doing well or get worse. Document Released: 10/31/2006 Document Revised: 09/12/2011 Document Reviewed: 01/01/2007 Exit[employer] Patient Information 2014 [employer], [place].   ________________________________________________________________________  WHAT IS A BLOOD TRANSFUSION? Blood Transfusion Information  A transfusion is the replacement of blood or some of its parts. Blood is made up of multiple cells which provide different functions. Red blood cells carry oxygen and are used for blood loss replacement. White blood cells fight against infection. Platelets control bleeding. Plasma helps clot blood. Other blood products are available for specialized needs, such as hemophilia or other clotting disorders. BEFORE THE TRANSFUSION  Who gives blood for transfusions?  Healthy volunteers who are fully evaluated to make sure their blood is safe. This is blood bank blood. Transfusion therapy is the safest it has ever been in the practice of medicine. Before blood is taken from a donor, a complete history is taken to make  sure that person has no history of diseases nor engages in risky social behavior (examples are intravenous drug use or sexual activity with multiple partners). The donor's travel  history is screened to minimize risk of transmitting infections, such as malaria. The donated blood is tested for signs of infectious diseases, such as HIV and hepatitis. The blood is then tested to be sure it is compatible with you in order to minimize the chance of a transfusion reaction. If you or a relative donates blood, this is often done in anticipation of surgery and is not appropriate for emergency situations. It takes many days to process the donated blood. RISKS AND COMPLICATIONS Although transfusion therapy is very safe and saves many lives, the main dangers of transfusion include:  Getting an infectious disease. Developing a transfusion reaction. This is an allergic reaction to something in the blood you were given. Every precaution is taken to prevent this. The decision to have a blood transfusion has been considered carefully by your caregiver before blood is given. Blood is not given unless the benefits outweigh the risks. AFTER THE TRANSFUSION Right after receiving a blood transfusion, you will usually feel much better and more energetic. This is especially true if your red blood cells have gotten low (anemic). The transfusion raises the level of the red blood cells which carry oxygen, and this usually causes an energy increase. The nurse administering the transfusion will monitor you carefully for complications. HOME CARE INSTRUCTIONS  No special instructions are needed after a transfusion. You may find your energy is better. Speak with your caregiver about any limitations on activity for underlying diseases you may have. SEEK MEDICAL CARE IF:  Your condition is not improving after your transfusion. You develop redness or irritation at the intravenous (IV) site. SEEK IMMEDIATE MEDICAL CARE IF:  Any of the following symptoms occur over the next 12 hours: Shaking chills. You have a temperature by mouth above 102 F (38.9 C), not controlled by medicine. Chest, back, or muscle  pain. People around you feel you are not acting correctly or are confused. Shortness of breath or difficulty breathing. Dizziness and fainting. You get a rash or develop hives. You have a decrease in urine output. Your urine turns a dark color or changes to pink, red, or brown. Any of the following symptoms occur over the next 10 days: You have a temperature by mouth above 102 F (38.9 C), not controlled by medicine. Shortness of breath. Weakness after normal activity. The white part of the eye turns yellow (jaundice). You have a decrease in the amount of urine or are urinating less often. Your urine turns a dark color or changes to pink, red, or brown. Document Released: 06/17/2000 Document Revised: 09/12/2011 Document Reviewed: 02/04/2008 [facility] Patient Information 2014 [place], [place].  _______________________________________________________________________

## 2021-05-14 ENCOUNTER — Other Ambulatory Visit: Payer: Self-pay | Admitting: Family Medicine

## 2021-05-14 ENCOUNTER — Encounter (HOSPITAL_COMMUNITY)
Admission: RE | Admit: 2021-05-14 | Discharge: 2021-05-14 | Disposition: A | Discharge status: Home / Self Care | Payer: BC Managed Care – PPO | Source: Ambulatory Visit | Attending: Surgery | Admitting: Surgery

## 2021-05-14 ENCOUNTER — Other Ambulatory Visit: Payer: Self-pay

## 2021-05-14 ENCOUNTER — Encounter (HOSPITAL_COMMUNITY): Payer: Self-pay

## 2021-05-14 VITALS — BP 150/95 | HR 78 | Temp 98.7°F | Resp 20 | Ht 63.5 in | Wt 280.0 lb

## 2021-05-14 DIAGNOSIS — Z01812 Encounter for preprocedural laboratory examination: Secondary | ICD-10-CM | POA: Insufficient documentation

## 2021-05-14 DIAGNOSIS — E119 Type 2 diabetes mellitus without complications: Secondary | ICD-10-CM | POA: Diagnosis not present

## 2021-05-14 DIAGNOSIS — I10 Essential (primary) hypertension: Secondary | ICD-10-CM | POA: Insufficient documentation

## 2021-05-14 DIAGNOSIS — Z01818 Encounter for other preprocedural examination: Secondary | ICD-10-CM

## 2021-05-14 LAB — COMPREHENSIVE METABOLIC PANEL
ALT: 67 U/L — ABNORMAL HIGH (ref 0–44)
AST: 58 U/L — ABNORMAL HIGH (ref 15–41)
Albumin: 4.5 g/dL (ref 3.5–5.0)
Alkaline Phosphatase: 69 U/L (ref 38–126)
Anion gap: 9 (ref 5–15)
BUN: 18 mg/dL (ref 6–20)
CO2: 22 mmol/L (ref 22–32)
Calcium: 9.2 mg/dL (ref 8.9–10.3)
Chloride: 104 mmol/L (ref 98–111)
Creatinine, Ser: 0.63 mg/dL (ref 0.44–1.00)
GFR, Estimated: >60 mL/min (ref >60)
Glucose, Bld: 118 mg/dL — ABNORMAL HIGH (ref 70–99)
Potassium: 4.3 mmol/L (ref 3.5–5.1)
Sodium: 135 mmol/L (ref 135–145)
Total Bilirubin: 0.8 mg/dL (ref 0.3–1.2)
Total Protein: 8 g/dL (ref 6.5–8.1)

## 2021-05-14 LAB — CBC WITH DIFFERENTIAL/PLATELET
Abs Immature Granulocytes: 0.03 10*3/uL (ref 0.00–0.07)
Basophils Absolute: 0.1 10*3/uL (ref 0.0–0.1)
Basophils Relative: 1 %
Eosinophils Absolute: 0.3 10*3/uL (ref 0.0–0.5)
Eosinophils Relative: 4 %
HCT: 43.7 % (ref 36.0–46.0)
Hemoglobin: 14.3 g/dL (ref 12.0–15.0)
Immature Granulocytes: 0 %
Lymphocytes Relative: 36 %
Lymphs Abs: 2.6 10*3/uL (ref 0.7–4.0)
MCH: 29.2 pg (ref 26.0–34.0)
MCHC: 32.7 g/dL (ref 30.0–36.0)
MCV: 89.2 fL (ref 80.0–100.0)
Monocytes Absolute: 0.5 10*3/uL (ref 0.1–1.0)
Monocytes Relative: 6 %
Neutro Abs: 3.8 10*3/uL (ref 1.7–7.7)
Neutrophils Relative %: 53 %
Platelets: 276 10*3/uL (ref 150–400)
RBC: 4.9 MIL/uL (ref 3.87–5.11)
RDW: 12.4 % (ref 11.5–15.5)
WBC: 7.3 10*3/uL (ref 4.0–10.5)
nRBC: 0 % (ref 0.0–0.2)

## 2021-05-14 LAB — GLUCOSE, CAPILLARY: Glucose-Capillary: 127 mg/dL — ABNORMAL HIGH (ref 70–99)

## 2021-05-14 LAB — HEMOGLOBIN A1C
Hgb A1c MFr Bld: 5.1 % (ref 4.8–5.6)
Mean Plasma Glucose: 99.67 mg/dL

## 2021-05-14 NOTE — Progress Notes (Signed)
COVID test- 05/20/21   PCP - Dr. Zack Seal Cardiologist - no  Chest x-ray - 09/02/20-epic EKG - 01/19/21-epic Stress Test - no ECHO - 02/08/21-epic Cardiac Cath - na Pacemaker/ICD device last checked:NA  Sleep Study - yes CPAP - yes  Fasting Blood Sugar - doesn't test Checks Blood Sugar _____ times a day  Blood Thinner Instructions:NA Aspirin Instructions: Last Dose:  Anesthesia review: yes  Patient denies shortness of breath, fever, cough and chest pain at PAT appointment Pr reports no SOB she uses her inhaler every day but rarely needs her rescue inhaler. Her BMI is 48.8  Patient verbalized understanding of instructions that were given to them at the PAT appointment. Patient was also instructed that they will need to review over the PAT instructions again at home before surgery. yes

## 2021-05-17 ENCOUNTER — Other Ambulatory Visit: Payer: Self-pay

## 2021-05-17 ENCOUNTER — Encounter (HOSPITAL_COMMUNITY): Payer: Self-pay

## 2021-05-17 NOTE — Progress Notes (Signed)
PCP - Dr. [name] Cardiologist - Dr. Johnsie Cancel EKG - 01/19/21 Chest x-ray - 09/02/20 ECHO - 02/08/21 Cardiac Cath -  CPAP -   Fasting Blood Sugar:  pt does not check CBG because she does not have monitor at home, per pt she only gets her CBG checked "at the doctor's office" Checks Blood Sugar:  /day  Blood Thinner Instructions:  Aspirin Instructions:   ERAS Protcol - n/a  COVID TEST- n/a  Anesthesia review: n/a  -------------  SDW INSTRUCTIONS:  Your procedure is scheduled on Tuesday 11/15. Please report to [facility] Main Entrance "A" at 0530 A.M., and check in at the Admitting office. Call this number if you have problems the morning of surgery: [phone]   Remember: Do not eat or drink after midnight the night before your surgery   Medications to take morning of surgery with a sip of water include: Inhaler --- Please bring all inhalers with you the day of surgery.  busPIRone (BUSPAR)  citalopram (CELEXA)  gabapentin (NEURONTIN)  omeprazole (PRILOSEC)  As of today, STOP taking any Aspirin (unless otherwise instructed by your surgeon), Aleve, Naproxen, Ibuprofen, Motrin, Advil, Goody's, BC's, all herbal medications, fish oil, and all vitamins.  ** PLEASE check your blood sugar the morning of your surgery when you wake up and every 2 hours until you get to the Short Stay unit.  If your blood sugar is less than 70 mg/dL, you will need to treat for low blood sugar: Do not take insulin. Treat a low blood sugar (less than 70 mg/dL) with  cup of clear juice (cranberry or apple), 4 glucose tablets, OR glucose gel. Recheck blood sugar in 15 minutes after treatment (to make sure it is greater than 70 mg/dL). If your blood sugar is not greater than 70 mg/dL on recheck, call [phone] for further instructions. No Metformin DOS   The Morning of Surgery Do not wear jewelry, make-up or nail polish. Do not wear lotions, powders, or perfumes, or deodorant  Do not bring  valuables to the hospital. [facility] is not responsible for any belongings or valuables.  If you are a smoker, DO NOT Smoke 24 hours prior to surgery  If you wear a CPAP at night please bring your mask the morning of surgery   Remember that you must have someone to transport you home after your surgery, and remain with you for 24 hours if you are discharged the same day.  Please bring cases for contacts, glasses, hearing aids, dentures or bridgework because it cannot be worn into surgery.   Patients discharged the day of surgery will not be allowed to drive home.   Please shower the NIGHT BEFORE/MORNING OF SURGERY (use antibacterial soap like DIAL soap if possible). Wear comfortable clothes the morning of surgery. Oral Hygiene is also important to reduce your risk of infection.  Remember - BRUSH YOUR TEETH THE MORNING OF SURGERY WITH YOUR REGULAR TOOTHPASTE  Patient denies shortness of breath, fever, cough and chest pain.

## 2021-05-17 NOTE — Anesthesia Preprocedure Evaluation (Addendum)
Anesthesia Evaluation  Patient identified by MRN, date of birth, ID band Patient awake    Reviewed: Allergy & Precautions, NPO status , Patient's Chart, lab work & pertinent test results  History of Anesthesia Complications Negative for: history of anesthetic complications  Airway Mallampati: III  TM Distance: >3 FB Neck ROM: Full    Dental  (+) Dental Advisory Given, Teeth Intact   Pulmonary asthma , sleep apnea and Continuous Positive Airway Pressure Ventilation ,    Pulmonary exam normal        Cardiovascular hypertension, Pt. on medications Normal cardiovascular exam     Neuro/Psych  Headaches, PSYCHIATRIC DISORDERS Anxiety Depression Bipolar Disorder    GI/Hepatic Neg liver ROS, GERD  Controlled and Medicated,  Endo/Other  diabetes, Type 2, Oral Hypoglycemic AgentsMorbid obesity  Renal/GU negative Renal ROS  Female GU complaint     Musculoskeletal negative musculoskeletal ROS (+)   Abdominal   Peds  Hematology negative hematology ROS (+)   Anesthesia Other Findings   Reproductive/Obstetrics  PCOS                             Anesthesia Physical Anesthesia Plan  ASA: 3  Anesthesia Plan: General   Post-op Pain Management:    Induction: Intravenous  PONV Risk Score and Plan: 3 and Treatment may vary due to age or medical condition, Ondansetron, Dexamethasone and Midazolam  Airway Management Planned: Oral ETT and Video Laryngoscope Planned  Additional Equipment: None  Intra-op Plan:   Post-operative Plan: Extubation in OR  Informed Consent: I have reviewed the patients History and Physical, chart, labs and discussed the procedure including the risks, benefits and alternatives for the proposed anesthesia with the patient or authorized representative who has indicated his/her understanding and acceptance.     Dental advisory given  Plan Discussed with: CRNA and  Anesthesiologist  Anesthesia Plan Comments:        Anesthesia Quick Evaluation

## 2021-05-18 ENCOUNTER — Encounter (HOSPITAL_COMMUNITY)
Admission: RE | Disposition: A | Discharge status: Home / Self Care | Payer: Self-pay | Source: Ambulatory Visit | Attending: Family Medicine

## 2021-05-18 ENCOUNTER — Encounter: Payer: BC Managed Care – PPO | Admitting: Gastroenterology

## 2021-05-18 ENCOUNTER — Other Ambulatory Visit: Payer: Self-pay

## 2021-05-18 ENCOUNTER — Ambulatory Visit (HOSPITAL_COMMUNITY): Payer: BC Managed Care – PPO

## 2021-05-18 ENCOUNTER — Ambulatory Visit (HOSPITAL_COMMUNITY)
Admission: RE | Admit: 2021-05-18 | Discharge: 2021-05-18 | Disposition: A | Discharge status: Home / Self Care | Payer: BC Managed Care – PPO | Source: Ambulatory Visit | Attending: Family Medicine | Admitting: Family Medicine

## 2021-05-18 ENCOUNTER — Encounter (HOSPITAL_COMMUNITY): Payer: Self-pay

## 2021-05-18 ENCOUNTER — Ambulatory Visit (HOSPITAL_COMMUNITY): Payer: BC Managed Care – PPO | Admitting: Anesthesiology

## 2021-05-18 DIAGNOSIS — F419 Anxiety disorder, unspecified: Secondary | ICD-10-CM | POA: Insufficient documentation

## 2021-05-18 DIAGNOSIS — Z7984 Long term (current) use of oral hypoglycemic drugs: Secondary | ICD-10-CM | POA: Diagnosis not present

## 2021-05-18 DIAGNOSIS — F319 Bipolar disorder, unspecified: Secondary | ICD-10-CM | POA: Diagnosis not present

## 2021-05-18 DIAGNOSIS — E119 Type 2 diabetes mellitus without complications: Secondary | ICD-10-CM | POA: Diagnosis not present

## 2021-05-18 DIAGNOSIS — G4730 Sleep apnea, unspecified: Secondary | ICD-10-CM | POA: Diagnosis not present

## 2021-05-18 DIAGNOSIS — E282 Polycystic ovarian syndrome: Secondary | ICD-10-CM | POA: Diagnosis not present

## 2021-05-18 DIAGNOSIS — J45909 Unspecified asthma, uncomplicated: Secondary | ICD-10-CM | POA: Insufficient documentation

## 2021-05-18 DIAGNOSIS — M47816 Spondylosis without myelopathy or radiculopathy, lumbar region: Secondary | ICD-10-CM | POA: Diagnosis not present

## 2021-05-18 DIAGNOSIS — Z6841 Body Mass Index (BMI) 40.0 and over, adult: Secondary | ICD-10-CM | POA: Diagnosis not present

## 2021-05-18 DIAGNOSIS — G1220 Motor neuron disease, unspecified: Secondary | ICD-10-CM | POA: Diagnosis present

## 2021-05-18 DIAGNOSIS — I10 Essential (primary) hypertension: Secondary | ICD-10-CM | POA: Diagnosis not present

## 2021-05-18 DIAGNOSIS — K219 Gastro-esophageal reflux disease without esophagitis: Secondary | ICD-10-CM | POA: Diagnosis not present

## 2021-05-18 HISTORY — PX: RADIOLOGY WITH ANESTHESIA: SHX6223

## 2021-05-18 HISTORY — DX: Sleep apnea, unspecified: G47.30

## 2021-05-18 LAB — GLUCOSE, CAPILLARY
Glucose-Capillary: 110 mg/dL — ABNORMAL HIGH (ref 70–99)
Glucose-Capillary: 131 mg/dL — ABNORMAL HIGH (ref 70–99)

## 2021-05-18 LAB — POCT PREGNANCY, URINE: Preg Test, Ur: NEGATIVE

## 2021-05-18 SURGERY — MRI WITH ANESTHESIA
Anesthesia: General

## 2021-05-18 MED ORDER — CHLORHEXIDINE GLUCONATE 0.12 % MT SOLN
OROMUCOSAL | Status: AC
  Administered 2021-05-18: 15 mL via OROMUCOSAL
  Filled 2021-05-18: qty 15

## 2021-05-18 MED ORDER — CHLORHEXIDINE GLUCONATE 0.12 % MT SOLN: 15.0000 mL | Freq: Once | OROMUCOSAL | Status: AC

## 2021-05-18 MED ORDER — FENTANYL CITRATE (PF) 100 MCG/2ML IJ SOLN
INTRAMUSCULAR | Status: AC
  Filled 2021-05-18: qty 2

## 2021-05-18 MED ORDER — ORAL CARE MOUTH RINSE: 15.0000 mL | Freq: Once | OROMUCOSAL | Status: AC

## 2021-05-18 MED ORDER — ROCURONIUM BROMIDE 10 MG/ML (PF) SYRINGE
PREFILLED_SYRINGE | INTRAVENOUS | Status: DC | PRN
  Administered 2021-05-18: 50 mg via INTRAVENOUS

## 2021-05-18 MED ORDER — SUGAMMADEX SODIUM 200 MG/2ML IV SOLN
INTRAVENOUS | Status: DC | PRN
  Administered 2021-05-18: 400 mg via INTRAVENOUS

## 2021-05-18 MED ORDER — PROMETHAZINE HCL 25 MG/ML IJ SOLN: 6.2500 mg | INTRAMUSCULAR | Status: DC | PRN

## 2021-05-18 MED ORDER — FENTANYL CITRATE (PF) 250 MCG/5ML IJ SOLN
INTRAMUSCULAR | Status: DC | PRN
  Administered 2021-05-18: 100 ug via INTRAVENOUS

## 2021-05-18 MED ORDER — PROPOFOL 10 MG/ML IV BOLUS
INTRAVENOUS | Status: DC | PRN
  Administered 2021-05-18: 200 mg via INTRAVENOUS

## 2021-05-18 MED ORDER — LACTATED RINGERS IV SOLN: INTRAVENOUS | Status: DC

## 2021-05-18 MED ORDER — LIDOCAINE 2% (20 MG/ML) 5 ML SYRINGE
INTRAMUSCULAR | Status: DC | PRN
  Administered 2021-05-18: 60 mg via INTRAVENOUS

## 2021-05-18 MED ORDER — SUCCINYLCHOLINE CHLORIDE 200 MG/10ML IV SOSY
PREFILLED_SYRINGE | INTRAVENOUS | Status: DC | PRN
  Administered 2021-05-18: 140 mg via INTRAVENOUS

## 2021-05-18 MED ORDER — ONDANSETRON HCL 4 MG/2ML IJ SOLN
INTRAMUSCULAR | Status: DC | PRN
  Administered 2021-05-18: 4 mg via INTRAVENOUS

## 2021-05-18 MED ORDER — MIDAZOLAM HCL 2 MG/2ML IJ SOLN
INTRAMUSCULAR | Status: DC | PRN
  Administered 2021-05-18: 2 mg via INTRAVENOUS

## 2021-05-18 MED ORDER — MIDAZOLAM HCL 2 MG/2ML IJ SOLN
INTRAMUSCULAR | Status: AC
  Filled 2021-05-18: qty 2

## 2021-05-18 NOTE — Anesthesia Procedure Notes (Signed)
Procedure Name: Intubation Date/Time: 05/18/2021 8:08 AM Performed by: Erick Colace, CRNA Pre-anesthesia Checklist: Patient identified, Emergency Drugs available, Suction available and Patient being monitored Patient Re-evaluated:Patient Re-evaluated prior to induction Oxygen Delivery Method: Circle system utilized Preoxygenation: Pre-oxygenation with 100% oxygen Induction Type: IV induction Laryngoscope Size: Glidescope and 3 Grade View: Grade I Tube type: Oral Tube size: 7.0 mm Number of attempts: 1 Airway Equipment and Method: Stylet and Oral airway Placement Confirmation: ETT inserted through vocal cords under direct vision, positive ETCO2 and breath sounds checked- equal and bilateral Secured at: 22 cm Tube secured with: Tape Dental Injury: Teeth and Oropharynx as per pre-operative assessment

## 2021-05-18 NOTE — H&P (Signed)
Anesthesia H&P Update: History and Physical Exam reviewed; patient is OK for planned anesthetic and procedure. ? ?

## 2021-05-18 NOTE — Anesthesia Postprocedure Evaluation (Signed)
Anesthesia Post Note  Patient: Debra Miller  Procedure(s) Performed: MRI WITH ANESTHESIA  THORACIC WITHOUT CONTRAST AND LUMBAR WITHOUT CONTRAST     Patient location during evaluation: PACU Anesthesia Type: General Level of consciousness: awake and alert Pain management: pain level controlled Vital Signs Assessment: post-procedure vital signs reviewed and stable Respiratory status: spontaneous breathing, nonlabored ventilation and respiratory function stable Cardiovascular status: stable and blood pressure returned to baseline Anesthetic complications: no   No notable events documented.  Last Vitals:  Vitals:   05/18/21 0925 05/18/21 0940  BP: (!) 109/54 127/83  Pulse: 91 84  Resp: 19 12  Temp:  (!) 36.2 C  SpO2: 97% 97%    Last Pain:  Vitals:   05/18/21 0940  TempSrc:   PainSc: 0-No pain                 Audry Pili

## 2021-05-18 NOTE — Progress Notes (Signed)
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 I hope that giving her B12 has helped with these sensations. Recommend she follow up with neurology if she is still having issues.

## 2021-05-18 NOTE — Transfer of Care (Signed)
Immediate Anesthesia Transfer of Care Note  Patient: Debra Miller  Procedure(s) Performed: MRI WITH ANESTHESIA  THORACIC WITHOUT CONTRAST AND LUMBAR WITHOUT CONTRAST  Patient Location: PACU  Anesthesia Type:General  Level of Consciousness: drowsy  Airway & Oxygen Therapy: Patient Spontanous Breathing and Patient connected to face mask oxygen  Post-op Assessment: Report given to RN and Post -op Vital signs reviewed and stable  Post vital signs: Reviewed and stable  Last Vitals:  Vitals Value Taken Time  BP 152/92 05/18/21 0910  Temp    Pulse 88 05/18/21 0910  Resp 22 05/18/21 0910  SpO2 92 % 05/18/21 0910  Vitals shown include unvalidated device data.  Last Pain:  Vitals:   05/18/21 0646  TempSrc:   PainSc: 6          Complications: No notable events documented.

## 2021-05-19 ENCOUNTER — Other Ambulatory Visit: Payer: Self-pay | Admitting: *Deleted

## 2021-05-19 ENCOUNTER — Encounter (HOSPITAL_COMMUNITY): Payer: Self-pay | Admitting: Radiology

## 2021-05-19 DIAGNOSIS — R200 Anesthesia of skin: Secondary | ICD-10-CM

## 2021-05-19 DIAGNOSIS — R202 Paresthesia of skin: Secondary | ICD-10-CM

## 2021-05-20 ENCOUNTER — Other Ambulatory Visit: Payer: Self-pay | Admitting: Surgery

## 2021-05-20 ENCOUNTER — Encounter: Payer: Self-pay | Admitting: Neurology

## 2021-05-20 LAB — SARS CORONAVIRUS 2 (TAT 6-24 HRS): SARS Coronavirus 2: NEGATIVE

## 2021-05-23 IMAGING — XA Imaging study
2 series · 2 of 2 positions shown · non-contrast
Comparison: none

CLINICAL DATA: Displacement the L5-S1 lumbar disc. Right lower
extremity radiculopathy.

[Series 1: ortho standard · 1 of 1 slices shown (1 of 2)]
[im 1/1]
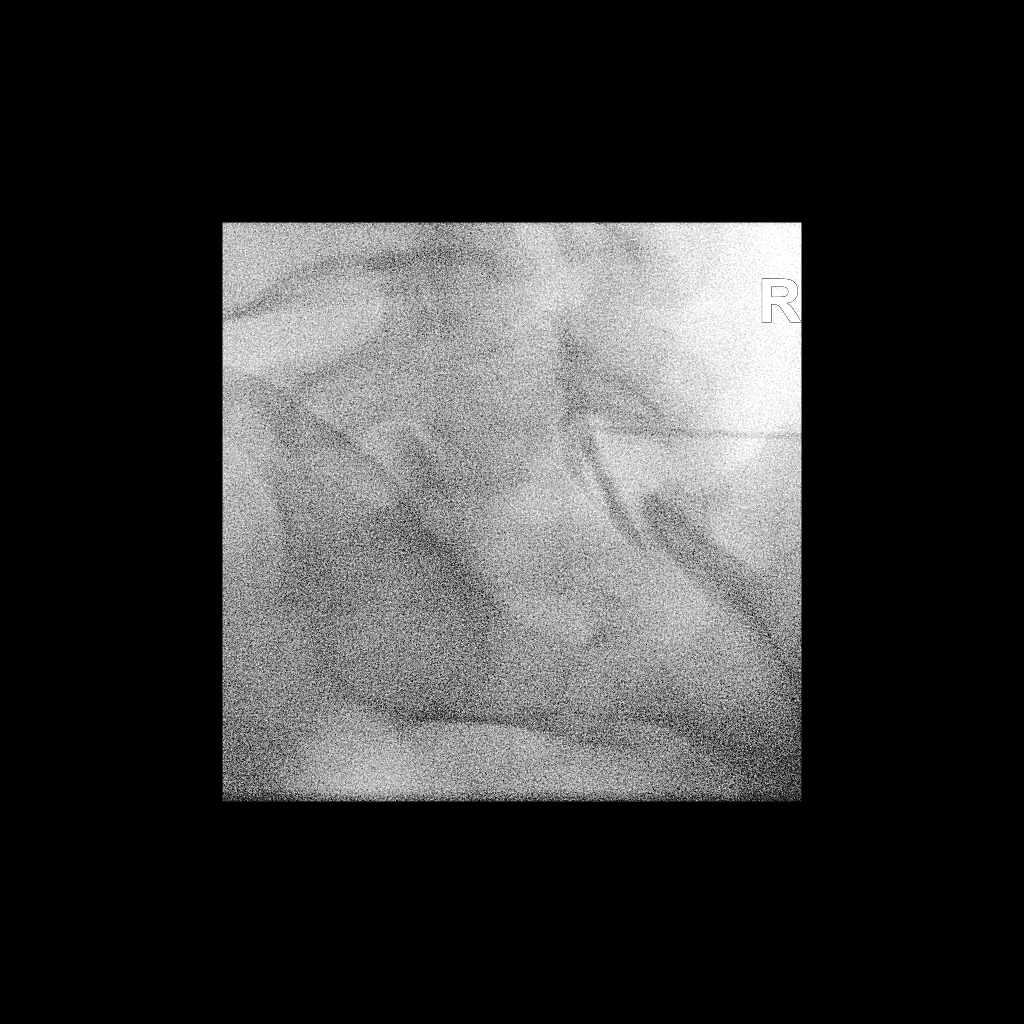

[Series 2: ortho standard · 1 of 1 slices shown (2 of 2)]
[im 1/1]
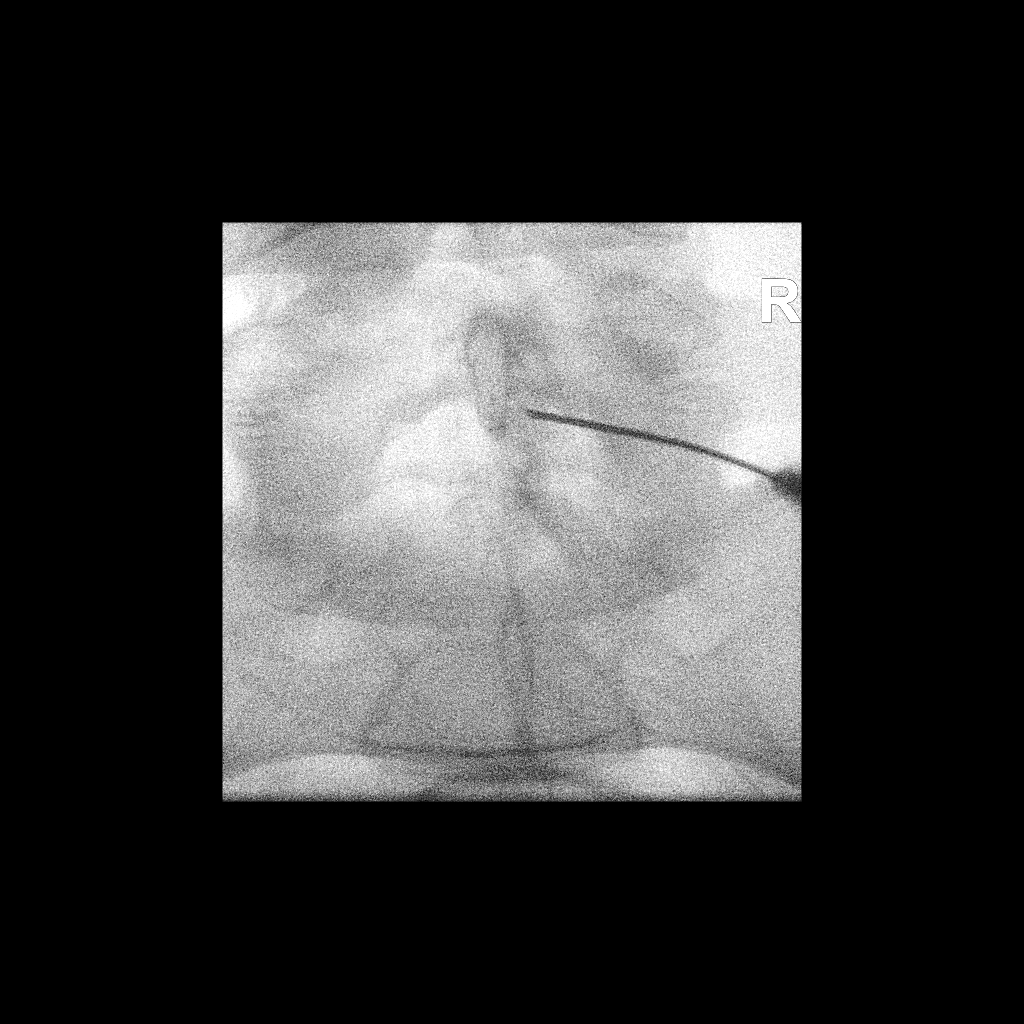

[2 of 2 positions shown; findings below may reference images not displayed]

FLUOROSCOPY TIME:  Radiation Exposure Index (as provided by the
fluoroscopic device): 17.13 uGy*m2

PROCEDURE:
The procedure, risks, benefits, and alternatives were explained to
the patient. Questions regarding the procedure were encouraged and
answered. The patient understands and consents to the procedure.

LUMBAR EPIDURAL INJECTION:

An interlaminar approach was performed on right at L5-S1. The
overlying skin was cleansed and anesthetized. A 20 gauge epidural
needle was advanced using loss-of-resistance technique.

DIAGNOSTIC EPIDURAL INJECTION:

Injection of Isovue-M 200 shows a good epidural pattern with spread
above and below the level of needle placement, primarily on the
right no vascular opacification is seen.

THERAPEUTIC EPIDURAL INJECTION:

120 mg of Depo-Medrol mixed with 3 mL 1% lidocaine were instilled.
The procedure was well-tolerated, and the patient was discharged
thirty minutes following the injection in good condition.

COMPLICATIONS:
None
IMPRESSION: Technically successful epidural injection on the right L5-S1 # 1

## 2021-05-24 ENCOUNTER — Encounter (HOSPITAL_COMMUNITY): Payer: Self-pay | Admitting: Surgery

## 2021-05-24 ENCOUNTER — Encounter (HOSPITAL_COMMUNITY)
Admission: RE | Disposition: A | Discharge status: Home / Self Care | Payer: Self-pay | Source: Home / Self Care | Attending: Surgery

## 2021-05-24 ENCOUNTER — Inpatient Hospital Stay (HOSPITAL_COMMUNITY): Payer: BC Managed Care – PPO | Admitting: Physician Assistant

## 2021-05-24 ENCOUNTER — Other Ambulatory Visit (HOSPITAL_COMMUNITY): Payer: Self-pay

## 2021-05-24 ENCOUNTER — Other Ambulatory Visit: Payer: Self-pay

## 2021-05-24 ENCOUNTER — Inpatient Hospital Stay (HOSPITAL_COMMUNITY)
Admission: RE | Admit: 2021-05-24 | Discharge: 2021-05-26 | DRG: 621 | Disposition: A | Discharge status: Home / Self Care | Payer: BC Managed Care – PPO | Attending: Surgery | Admitting: Surgery

## 2021-05-24 DIAGNOSIS — Z881 Allergy status to other antibiotic agents status: Secondary | ICD-10-CM

## 2021-05-24 DIAGNOSIS — E119 Type 2 diabetes mellitus without complications: Secondary | ICD-10-CM | POA: Diagnosis present

## 2021-05-24 DIAGNOSIS — G4733 Obstructive sleep apnea (adult) (pediatric): Secondary | ICD-10-CM | POA: Diagnosis present

## 2021-05-24 DIAGNOSIS — E282 Polycystic ovarian syndrome: Secondary | ICD-10-CM | POA: Diagnosis present

## 2021-05-24 DIAGNOSIS — F419 Anxiety disorder, unspecified: Secondary | ICD-10-CM | POA: Diagnosis present

## 2021-05-24 DIAGNOSIS — Z6841 Body Mass Index (BMI) 40.0 and over, adult: Secondary | ICD-10-CM

## 2021-05-24 DIAGNOSIS — Z833 Family history of diabetes mellitus: Secondary | ICD-10-CM | POA: Diagnosis not present

## 2021-05-24 DIAGNOSIS — Z803 Family history of malignant neoplasm of breast: Secondary | ICD-10-CM | POA: Diagnosis not present

## 2021-05-24 DIAGNOSIS — Z86001 Personal history of in-situ neoplasm of cervix uteri: Secondary | ICD-10-CM | POA: Diagnosis not present

## 2021-05-24 DIAGNOSIS — Z825 Family history of asthma and other chronic lower respiratory diseases: Secondary | ICD-10-CM | POA: Diagnosis not present

## 2021-05-24 DIAGNOSIS — F319 Bipolar disorder, unspecified: Secondary | ICD-10-CM | POA: Diagnosis present

## 2021-05-24 DIAGNOSIS — Z8349 Family history of other endocrine, nutritional and metabolic diseases: Secondary | ICD-10-CM | POA: Diagnosis not present

## 2021-05-24 DIAGNOSIS — Z79899 Other long term (current) drug therapy: Secondary | ICD-10-CM | POA: Diagnosis not present

## 2021-05-24 DIAGNOSIS — Z9884 Bariatric surgery status: Secondary | ICD-10-CM

## 2021-05-24 DIAGNOSIS — Z888 Allergy status to other drugs, medicaments and biological substances status: Secondary | ICD-10-CM

## 2021-05-24 DIAGNOSIS — Z823 Family history of stroke: Secondary | ICD-10-CM

## 2021-05-24 DIAGNOSIS — Z01818 Encounter for other preprocedural examination: Secondary | ICD-10-CM

## 2021-05-24 DIAGNOSIS — J45909 Unspecified asthma, uncomplicated: Secondary | ICD-10-CM | POA: Diagnosis present

## 2021-05-24 DIAGNOSIS — Z7984 Long term (current) use of oral hypoglycemic drugs: Secondary | ICD-10-CM | POA: Diagnosis not present

## 2021-05-24 DIAGNOSIS — R110 Nausea: Secondary | ICD-10-CM | POA: Diagnosis not present

## 2021-05-24 DIAGNOSIS — Z8371 Family history of colonic polyps: Secondary | ICD-10-CM | POA: Diagnosis not present

## 2021-05-24 DIAGNOSIS — I10 Essential (primary) hypertension: Secondary | ICD-10-CM | POA: Diagnosis present

## 2021-05-24 DIAGNOSIS — Z8261 Family history of arthritis: Secondary | ICD-10-CM | POA: Diagnosis not present

## 2021-05-24 DIAGNOSIS — Z8249 Family history of ischemic heart disease and other diseases of the circulatory system: Secondary | ICD-10-CM

## 2021-05-24 DIAGNOSIS — K219 Gastro-esophageal reflux disease without esophagitis: Secondary | ICD-10-CM | POA: Diagnosis present

## 2021-05-24 HISTORY — DX: Bariatric surgery status: Z98.84

## 2021-05-24 HISTORY — PX: ROUX-EN-Y GASTRIC BYPASS: SHX1104

## 2021-05-24 LAB — CBC
HCT: 42 % (ref 36.0–46.0)
Hemoglobin: 13.8 g/dL (ref 12.0–15.0)
MCH: 29.7 pg (ref 26.0–34.0)
MCHC: 32.9 g/dL (ref 30.0–36.0)
MCV: 90.3 fL (ref 80.0–100.0)
Platelets: 247 10*3/uL (ref 150–400)
RBC: 4.65 MIL/uL (ref 3.87–5.11)
RDW: 12.4 % (ref 11.5–15.5)
WBC: 12.8 10*3/uL — ABNORMAL HIGH (ref 4.0–10.5)
nRBC: 0 % (ref 0.0–0.2)

## 2021-05-24 LAB — CREATININE, SERUM
Creatinine, Ser: 0.59 mg/dL (ref 0.44–1.00)
GFR, Estimated: >60 mL/min (ref >60)

## 2021-05-24 LAB — TYPE AND SCREEN
ABO/RH(D): O NEG
Antibody Screen: NEGATIVE

## 2021-05-24 LAB — GLUCOSE, CAPILLARY
Glucose-Capillary: 113 mg/dL — ABNORMAL HIGH (ref 70–99)
Glucose-Capillary: 134 mg/dL — ABNORMAL HIGH (ref 70–99)
Glucose-Capillary: 146 mg/dL — ABNORMAL HIGH (ref 70–99)

## 2021-05-24 LAB — PREGNANCY, URINE: Preg Test, Ur: NEGATIVE

## 2021-05-24 SURGERY — CREATION, GASTRIC BYPASS, ROUX-EN-Y, ROBOT-ASSISTED
Anesthesia: General | Site: Abdomen

## 2021-05-24 MED ORDER — AMISULPRIDE (ANTIEMETIC) 5 MG/2ML IV SOLN
10.0000 mg | Freq: Once | INTRAVENOUS | Status: AC | PRN
  Administered 2021-05-24: 10 mg via INTRAVENOUS

## 2021-05-24 MED ORDER — CITALOPRAM HYDROBROMIDE 20 MG PO TABS
20.0000 mg | ORAL_TABLET | Freq: Every day | ORAL | Status: DC
  Administered 2021-05-25 – 2021-05-26 (×2): 20 mg via ORAL
  Filled 2021-05-24 (×4): qty 1

## 2021-05-24 MED ORDER — BUPIVACAINE LIPOSOME 1.3 % IJ SUSP: 20.0000 mL | Freq: Once | INTRAMUSCULAR | Status: DC

## 2021-05-24 MED ORDER — ROCURONIUM BROMIDE 10 MG/ML (PF) SYRINGE
PREFILLED_SYRINGE | INTRAVENOUS | Status: DC | PRN
  Administered 2021-05-24: 60 mg via INTRAVENOUS
  Administered 2021-05-24: 10 mg via INTRAVENOUS
  Administered 2021-05-24: 20 mg via INTRAVENOUS
  Administered 2021-05-24 (×2): 10 mg via INTRAVENOUS

## 2021-05-24 MED ORDER — PROPOFOL 10 MG/ML IV BOLUS
INTRAVENOUS | Status: DC | PRN
  Administered 2021-05-24: 200 mg via INTRAVENOUS

## 2021-05-24 MED ORDER — PROMETHAZINE HCL 25 MG/ML IJ SOLN
INTRAMUSCULAR | Status: AC
  Administered 2021-05-24: 6.25 mg via INTRAVENOUS
  Filled 2021-05-24: qty 1

## 2021-05-24 MED ORDER — ENSURE MAX PROTEIN PO LIQD
2.0000 [oz_av] | ORAL | Status: DC
  Administered 2021-05-25 – 2021-05-26 (×6): 2 [oz_av] via ORAL

## 2021-05-24 MED ORDER — MONTELUKAST SODIUM 10 MG PO TABS
10.0000 mg | ORAL_TABLET | Freq: Every day | ORAL | Status: DC
  Filled 2021-05-24: qty 1

## 2021-05-24 MED ORDER — SUGAMMADEX SODIUM 500 MG/5ML IV SOLN
INTRAVENOUS | Status: AC
  Filled 2021-05-24: qty 5

## 2021-05-24 MED ORDER — GABAPENTIN 300 MG PO CAPS
300.0000 mg | ORAL_CAPSULE | Freq: Three times a day (TID) | ORAL | Status: DC
  Administered 2021-05-25 – 2021-05-26 (×3): 300 mg via ORAL
  Filled 2021-05-24 (×5): qty 1

## 2021-05-24 MED ORDER — FENTANYL CITRATE PF 50 MCG/ML IJ SOSY
25.0000 ug | PREFILLED_SYRINGE | INTRAMUSCULAR | Status: DC | PRN
  Administered 2021-05-24: 50 ug via INTRAVENOUS

## 2021-05-24 MED ORDER — 0.9 % SODIUM CHLORIDE (POUR BTL) OPTIME
TOPICAL | Status: DC | PRN
  Administered 2021-05-24: 1000 mL

## 2021-05-24 MED ORDER — DEXAMETHASONE SODIUM PHOSPHATE 10 MG/ML IJ SOLN
INTRAMUSCULAR | Status: DC | PRN
  Administered 2021-05-24: 6 mg via INTRAVENOUS

## 2021-05-24 MED ORDER — APREPITANT 40 MG PO CAPS
40.0000 mg | ORAL_CAPSULE | ORAL | Status: AC
  Administered 2021-05-24: 40 mg via ORAL
  Filled 2021-05-24: qty 1

## 2021-05-24 MED ORDER — BUPIVACAINE LIPOSOME 1.3 % IJ SUSP
INTRAMUSCULAR | Status: DC | PRN
  Administered 2021-05-24: 20 mL

## 2021-05-24 MED ORDER — CHLORHEXIDINE GLUCONATE CLOTH 2 % EX PADS: 6.0000 | MEDICATED_PAD | Freq: Once | CUTANEOUS | Status: DC

## 2021-05-24 MED ORDER — LABETALOL HCL 5 MG/ML IV SOLN
INTRAVENOUS | Status: AC
  Filled 2021-05-24: qty 4

## 2021-05-24 MED ORDER — INSULIN ASPART 100 UNIT/ML IJ SOLN
0.0000 [IU] | Freq: Three times a day (TID) | INTRAMUSCULAR | Status: DC
  Administered 2021-05-24 – 2021-05-25 (×2): 2 [IU] via SUBCUTANEOUS

## 2021-05-24 MED ORDER — ONDANSETRON HCL 4 MG/2ML IJ SOLN
INTRAMUSCULAR | Status: AC
  Filled 2021-05-24: qty 2

## 2021-05-24 MED ORDER — LABETALOL HCL 5 MG/ML IV SOLN
10.0000 mg | Freq: Once | INTRAVENOUS | Status: AC
  Administered 2021-05-24: 10 mg via INTRAVENOUS

## 2021-05-24 MED ORDER — ROCURONIUM BROMIDE 10 MG/ML (PF) SYRINGE
PREFILLED_SYRINGE | INTRAVENOUS | Status: AC
  Filled 2021-05-24: qty 10

## 2021-05-24 MED ORDER — OXYCODONE HCL 5 MG/5ML PO SOLN
5.0000 mg | Freq: Four times a day (QID) | ORAL | Status: DC | PRN
  Administered 2021-05-24 – 2021-05-26 (×5): 5 mg via ORAL
  Filled 2021-05-24 (×6): qty 5

## 2021-05-24 MED ORDER — BUPIVACAINE-EPINEPHRINE (PF) 0.25% -1:200000 IJ SOLN
INTRAMUSCULAR | Status: AC
  Filled 2021-05-24: qty 30

## 2021-05-24 MED ORDER — ALBUTEROL SULFATE (2.5 MG/3ML) 0.083% IN NEBU
3.0000 mL | INHALATION_SOLUTION | Freq: Four times a day (QID) | RESPIRATORY_TRACT | Status: DC | PRN
  Filled 2021-05-24: qty 3

## 2021-05-24 MED ORDER — LIDOCAINE HCL (PF) 2 % IJ SOLN
INTRAMUSCULAR | Status: AC
  Filled 2021-05-24: qty 5

## 2021-05-24 MED ORDER — MIDAZOLAM HCL 2 MG/2ML IJ SOLN
INTRAMUSCULAR | Status: AC
  Filled 2021-05-24: qty 2

## 2021-05-24 MED ORDER — BUDESONIDE 0.25 MG/2ML IN SUSP
0.2500 mg | Freq: Two times a day (BID) | RESPIRATORY_TRACT | Status: DC
  Administered 2021-05-25 – 2021-05-26 (×3): 0.25 mg via RESPIRATORY_TRACT
  Filled 2021-05-24 (×4): qty 2

## 2021-05-24 MED ORDER — LACTATED RINGERS IV SOLN: INTRAVENOUS | Status: DC

## 2021-05-24 MED ORDER — PROPOFOL 10 MG/ML IV BOLUS
INTRAVENOUS | Status: AC
  Filled 2021-05-24: qty 20

## 2021-05-24 MED ORDER — MORPHINE SULFATE (PF) 2 MG/ML IV SOLN: 1.0000 mg | INTRAVENOUS | Status: DC | PRN

## 2021-05-24 MED ORDER — DEXAMETHASONE SODIUM PHOSPHATE 10 MG/ML IJ SOLN
INTRAMUSCULAR | Status: AC
  Filled 2021-05-24: qty 1

## 2021-05-24 MED ORDER — STERILE WATER FOR IRRIGATION IR SOLN
Status: DC | PRN
  Administered 2021-05-24: 2000 mL

## 2021-05-24 MED ORDER — ENOXAPARIN SODIUM 40 MG/0.4ML IJ SOSY
40.0000 mg | PREFILLED_SYRINGE | INTRAMUSCULAR | Status: AC
  Administered 2021-05-24: 40 mg via SUBCUTANEOUS
  Filled 2021-05-24: qty 0.4

## 2021-05-24 MED ORDER — CITALOPRAM HYDROBROMIDE 20 MG PO TABS
40.0000 mg | ORAL_TABLET | Freq: Every day | ORAL | Status: DC
Start: 2021-05-24 — End: 2021-05-24

## 2021-05-24 MED ORDER — PROMETHAZINE HCL 25 MG/ML IJ SOLN: 6.2500 mg | Freq: Once | INTRAMUSCULAR | Status: AC

## 2021-05-24 MED ORDER — LIDOCAINE 2% (20 MG/ML) 5 ML SYRINGE
INTRAMUSCULAR | Status: DC | PRN
  Administered 2021-05-24: 1.5 mg/kg/h via INTRAVENOUS

## 2021-05-24 MED ORDER — ACETAMINOPHEN 500 MG PO TABS: 1000.0000 mg | ORAL_TABLET | ORAL | Status: DC

## 2021-05-24 MED ORDER — SIMETHICONE 80 MG PO CHEW: 80.0000 mg | CHEWABLE_TABLET | Freq: Four times a day (QID) | ORAL | Status: DC | PRN

## 2021-05-24 MED ORDER — GABAPENTIN 300 MG PO CAPS
300.0000 mg | ORAL_CAPSULE | ORAL | Status: DC
  Filled 2021-05-24: qty 1

## 2021-05-24 MED ORDER — BUPIVACAINE-EPINEPHRINE 0.25% -1:200000 IJ SOLN
INTRAMUSCULAR | Status: DC | PRN
  Administered 2021-05-24: 30 mL

## 2021-05-24 MED ORDER — INSULIN ASPART 100 UNIT/ML IJ SOLN: 0.0000 [IU] | Freq: Every day | INTRAMUSCULAR | Status: DC

## 2021-05-24 MED ORDER — LEVOFLOXACIN IN D5W 750 MG/150ML IV SOLN
750.0000 mg | INTRAVENOUS | Status: AC
  Administered 2021-05-24: 750 mg via INTRAVENOUS
  Filled 2021-05-24: qty 150

## 2021-05-24 MED ORDER — ACETAMINOPHEN 160 MG/5ML PO SOLN: 1000.0000 mg | Freq: Three times a day (TID) | ORAL | Status: DC

## 2021-05-24 MED ORDER — FENTANYL CITRATE PF 50 MCG/ML IJ SOSY
PREFILLED_SYRINGE | INTRAMUSCULAR | Status: AC
  Administered 2021-05-24: 25 ug via INTRAVENOUS
  Filled 2021-05-24: qty 3

## 2021-05-24 MED ORDER — BUPIVACAINE LIPOSOME 1.3 % IJ SUSP
INTRAMUSCULAR | Status: AC
  Filled 2021-05-24: qty 20

## 2021-05-24 MED ORDER — ACETAMINOPHEN 500 MG PO TABS
1000.0000 mg | ORAL_TABLET | Freq: Three times a day (TID) | ORAL | Status: DC
  Administered 2021-05-25 – 2021-05-26 (×2): 1000 mg via ORAL
  Filled 2021-05-24 (×5): qty 2

## 2021-05-24 MED ORDER — CHLORHEXIDINE GLUCONATE 0.12 % MT SOLN
15.0000 mL | Freq: Once | OROMUCOSAL | Status: AC
  Administered 2021-05-24: 15 mL via OROMUCOSAL

## 2021-05-24 MED ORDER — ACETAMINOPHEN 500 MG PO TABS
1000.0000 mg | ORAL_TABLET | Freq: Once | ORAL | Status: AC
  Administered 2021-05-24: 1000 mg via ORAL
  Filled 2021-05-24: qty 2

## 2021-05-24 MED ORDER — AMISULPRIDE (ANTIEMETIC) 5 MG/2ML IV SOLN
INTRAVENOUS | Status: AC
  Filled 2021-05-24: qty 4

## 2021-05-24 MED ORDER — BUSPIRONE HCL 5 MG PO TABS
10.0000 mg | ORAL_TABLET | Freq: Two times a day (BID) | ORAL | Status: DC
  Administered 2021-05-25 – 2021-05-26 (×2): 10 mg via ORAL
  Filled 2021-05-24 (×3): qty 2

## 2021-05-24 MED ORDER — FENTANYL CITRATE (PF) 250 MCG/5ML IJ SOLN
INTRAMUSCULAR | Status: AC
  Filled 2021-05-24: qty 5

## 2021-05-24 MED ORDER — KETAMINE HCL 10 MG/ML IJ SOLN
INTRAMUSCULAR | Status: DC | PRN
  Administered 2021-05-24: 30 mg via INTRAVENOUS

## 2021-05-24 MED ORDER — ONDANSETRON HCL 4 MG/2ML IJ SOLN
4.0000 mg | INTRAMUSCULAR | Status: DC | PRN
  Administered 2021-05-24 – 2021-05-26 (×5): 4 mg via INTRAVENOUS
  Filled 2021-05-24 (×5): qty 2

## 2021-05-24 MED ORDER — ENOXAPARIN SODIUM 30 MG/0.3ML IJ SOSY
30.0000 mg | PREFILLED_SYRINGE | Freq: Two times a day (BID) | INTRAMUSCULAR | Status: DC
  Administered 2021-05-24 – 2021-05-26 (×4): 30 mg via SUBCUTANEOUS
  Filled 2021-05-24 (×4): qty 0.3

## 2021-05-24 MED ORDER — SUGAMMADEX SODIUM 200 MG/2ML IV SOLN
INTRAVENOUS | Status: DC | PRN
  Administered 2021-05-24: 300 mg via INTRAVENOUS

## 2021-05-24 MED ORDER — LIDOCAINE 2% (20 MG/ML) 5 ML SYRINGE
INTRAMUSCULAR | Status: DC | PRN
Start: 2021-05-24 — End: 2021-05-24
  Administered 2021-05-24: 80 mg via INTRAVENOUS

## 2021-05-24 MED ORDER — ONDANSETRON HCL 4 MG/2ML IJ SOLN
INTRAMUSCULAR | Status: DC | PRN
  Administered 2021-05-24: 4 mg via INTRAVENOUS

## 2021-05-24 MED ORDER — FENTANYL CITRATE (PF) 250 MCG/5ML IJ SOLN
INTRAMUSCULAR | Status: DC | PRN
  Administered 2021-05-24: 100 ug via INTRAVENOUS
  Administered 2021-05-24 (×2): 50 ug via INTRAVENOUS

## 2021-05-24 MED ORDER — SUMATRIPTAN SUCCINATE 50 MG PO TABS
50.0000 mg | ORAL_TABLET | ORAL | Status: DC | PRN
  Filled 2021-05-24: qty 1

## 2021-05-24 MED ORDER — LACTATED RINGERS IR SOLN
Status: DC | PRN
  Administered 2021-05-24: 1000 mL

## 2021-05-24 MED ORDER — PANTOPRAZOLE SODIUM 40 MG IV SOLR
40.0000 mg | Freq: Every day | INTRAVENOUS | Status: DC
  Administered 2021-05-24 – 2021-05-25 (×2): 40 mg via INTRAVENOUS
  Filled 2021-05-24 (×2): qty 40

## 2021-05-24 MED ORDER — ORAL CARE MOUTH RINSE: 15.0000 mL | Freq: Once | OROMUCOSAL | Status: AC

## 2021-05-24 MED ORDER — MIDAZOLAM HCL 5 MG/5ML IJ SOLN
INTRAMUSCULAR | Status: DC | PRN
  Administered 2021-05-24: 2 mg via INTRAVENOUS

## 2021-05-24 MED ORDER — SCOPOLAMINE 1 MG/3DAYS TD PT72
1.0000 | MEDICATED_PATCH | TRANSDERMAL | Status: DC
  Filled 2021-05-24: qty 1

## 2021-05-24 SURGICAL SUPPLY — 77 items
ADH SKN CLS APL DERMABOND .7 (GAUZE/BANDAGES/DRESSINGS) ×1
APL PRP STRL LF DISP 70% ISPRP (MISCELLANEOUS) ×1
APPLIER CLIP 5 13 M/L LIGAMAX5 (MISCELLANEOUS)
APPLIER CLIP ROT 10 11.4 M/L (STAPLE)
APR CLP MED LRG 11.4X10 (STAPLE)
APR CLP MED LRG 5 ANG JAW (MISCELLANEOUS)
BLADE SURG SZ11 CARB STEEL (BLADE) ×2 IMPLANT
CANNULA REDUC XI 12-8 STAPL (CANNULA) ×2
CANNULA REDUCER 12-8 DVNC XI (CANNULA) ×1 IMPLANT
CHLORAPREP W/TINT 26 (MISCELLANEOUS) ×2 IMPLANT
CLIP APPLIE 5 13 M/L LIGAMAX5 (MISCELLANEOUS) IMPLANT
CLIP APPLIE ROT 10 11.4 M/L (STAPLE) IMPLANT
COVER SURGICAL LIGHT HANDLE (MISCELLANEOUS) ×2 IMPLANT
COVER TIP SHEARS 8 DVNC (MISCELLANEOUS) ×1 IMPLANT
COVER TIP SHEARS 8MM DA VINCI (MISCELLANEOUS) ×2
DECANTER SPIKE VIAL GLASS SM (MISCELLANEOUS) ×2 IMPLANT
DERMABOND ADVANCED (GAUZE/BANDAGES/DRESSINGS) ×1
DERMABOND ADVANCED .7 DNX12 (GAUZE/BANDAGES/DRESSINGS) ×1 IMPLANT
DRAPE ARM DVNC X/XI (DISPOSABLE) ×4 IMPLANT
DRAPE COLUMN DVNC XI (DISPOSABLE) ×1 IMPLANT
DRAPE DA VINCI XI ARM (DISPOSABLE) ×8
DRAPE DA VINCI XI COLUMN (DISPOSABLE) ×2
ELECT REM PT RETURN 15FT ADLT (MISCELLANEOUS) ×2 IMPLANT
GAUZE 4X4 16PLY ~~LOC~~+RFID DBL (SPONGE) ×2 IMPLANT
GLOVE SURG ENC MOIS LTX SZ7.5 (GLOVE) ×4 IMPLANT
GLOVE SURG UNDER LTX SZ8 (GLOVE) ×4 IMPLANT
GOWN STRL REUS W/TWL XL LVL3 (GOWN DISPOSABLE) ×6 IMPLANT
GRASPER SUT TROCAR 14GX15 (MISCELLANEOUS) ×2 IMPLANT
IRRIG SUCT STRYKERFLOW 2 WTIP (MISCELLANEOUS) ×2
IRRIGATION SUCT STRKRFLW 2 WTP (MISCELLANEOUS) ×1 IMPLANT
KIT BASIN OR (CUSTOM PROCEDURE TRAY) ×2 IMPLANT
KIT GASTRIC LAVAGE 34FR ADT (SET/KITS/TRAYS/PACK) ×2 IMPLANT
KIT TURNOVER KIT A (KITS) ×1 IMPLANT
LUBRICANT JELLY K Y 4OZ (MISCELLANEOUS) IMPLANT
MARKER SKIN DUAL TIP RULER LAB (MISCELLANEOUS) ×2 IMPLANT
MAT PREVALON FULL STRYKER (MISCELLANEOUS) ×2 IMPLANT
NDL SPNL 18GX3.5 QUINCKE PK (NEEDLE) ×1 IMPLANT
NEEDLE SPNL 18GX3.5 QUINCKE PK (NEEDLE) ×2 IMPLANT
OBTURATOR OPTICAL STANDARD 8MM (TROCAR) ×2
OBTURATOR OPTICAL STND 8 DVNC (TROCAR) ×1
OBTURATOR OPTICALSTD 8 DVNC (TROCAR) ×1 IMPLANT
PACK CARDIOVASCULAR III (CUSTOM PROCEDURE TRAY) ×2 IMPLANT
RELOAD STAPLE 60 2.5 WHT DVNC (STAPLE) ×3 IMPLANT
RELOAD STAPLE 60 3.5 BLU DVNC (STAPLE) IMPLANT
RELOAD STAPLER 2.5X60 WHT DVNC (STAPLE) ×7 IMPLANT
RELOAD STAPLER 3.5X60 BLU DVNC (STAPLE) IMPLANT
SEAL CANN UNIV 5-8 DVNC XI (MISCELLANEOUS) ×3 IMPLANT
SEAL XI 5MM-8MM UNIVERSAL (MISCELLANEOUS) ×6
SEALER SYNCHRO 8 IS4000 DV (MISCELLANEOUS) ×2
SEALER SYNCHRO 8 IS4000 DVNC (MISCELLANEOUS) ×1 IMPLANT
SOL ANTI FOG 6CC (MISCELLANEOUS) ×1 IMPLANT
SOLUTION ANTI FOG 6CC (MISCELLANEOUS) ×1
SOLUTION ELECTROLUBE (MISCELLANEOUS) ×2 IMPLANT
STAPLER 60 DA VINCI SURE FORM (STAPLE) ×2
STAPLER 60 SUREFORM DVNC (STAPLE) ×1 IMPLANT
STAPLER CANNULA SEAL DVNC XI (STAPLE) ×1 IMPLANT
STAPLER CANNULA SEAL XI (STAPLE) ×2
STAPLER RELOAD 2.5X60 WHITE (STAPLE) ×14
STAPLER RELOAD 2.5X60 WHT DVNC (STAPLE) ×7
STAPLER RELOAD 3.5X60 BLU DVNC (STAPLE)
STAPLER RELOAD 3.5X60 BLUE (STAPLE)
SUT ETHIBOND 0 (SUTURE) ×2 IMPLANT
SUT MNCRL AB 4-0 PS2 18 (SUTURE) ×2 IMPLANT
SUT V-LOC BARB 180 2/0GR6 GS22 (SUTURE) ×4
SUT VIC AB 2-0 SH 27 (SUTURE) ×2
SUT VIC AB 2-0 SH 27XBRD (SUTURE) IMPLANT
SUT VICRYL 0 TIES 12 18 (SUTURE) ×2 IMPLANT
SUT VLOC BARB 180 ABS3/0GR12 (SUTURE) ×4
SUTURE V-LC BRB 180 2/0GR6GS22 (SUTURE) ×2 IMPLANT
SUTURE VLOC BRB 180 ABS3/0GR12 (SUTURE) ×2 IMPLANT
SYR 20ML LL LF (SYRINGE) ×2 IMPLANT
TOWEL OR 17X26 10 PK STRL BLUE (TOWEL DISPOSABLE) ×2 IMPLANT
TRAY FOLEY MTR SLVR 16FR STAT (SET/KITS/TRAYS/PACK) IMPLANT
TROCAR ADV FIXATION 12X100MM (TROCAR) ×2 IMPLANT
TROCAR Z-THREAD FIOS 5X100MM (TROCAR) ×2 IMPLANT
TUBE CALIBRATION LAPBAND (TUBING) IMPLANT
TUBING INSUFFLATION 10FT LAP (TUBING) ×2 IMPLANT

## 2021-05-24 NOTE — Anesthesia Procedure Notes (Signed)
Procedure Name: Intubation Date/Time: 05/24/2021 11:11 AM Performed by: Maxwell Caul, CRNA Pre-anesthesia Checklist: Patient identified, Emergency Drugs available, Suction available and Patient being monitored Patient Re-evaluated:Patient Re-evaluated prior to induction Oxygen Delivery Method: Circle system utilized Preoxygenation: Pre-oxygenation with 100% oxygen Induction Type: IV induction Ventilation: Mask ventilation without difficulty Laryngoscope Size: Glidescope and 4 Grade View: Grade I Tube type: Oral Tube size: 7.0 mm Number of attempts: 1 Airway Equipment and Method: Stylet Placement Confirmation: ETT inserted through vocal cords under direct vision, positive ETCO2 and breath sounds checked- equal and bilateral Secured at: 21 cm Tube secured with: Tape Dental Injury: Teeth and Oropharynx as per pre-operative assessment  Difficulty Due To: Difficulty was anticipated and Difficult Airway- due to anterior larynx Comments: Elective Glidescope intubation due to prior history. DL x 1 with Glidescope and ETT 7.0 gently placed.

## 2021-05-24 NOTE — Progress Notes (Signed)
PHARMACY CONSULT FOR:  Risk Assessment for Post-Discharge VTE Following Bariatric Surgery  Post-Discharge VTE Risk Assessment: This patient's probability of 30-day post-discharge VTE is increased due to the factors marked:   Female    Age >/=60 years    BMI >/=50 kg/m2    CHF    Dyspnea at Rest    Paraplegia   X Non-gastric-band surgery    Operation Time >/=3 hr    Return to OR     Length of Stay >/= 3 d   Hx of VTE   Hypercoagulable condition   Significant venous stasis    Predicted probability of 30-day post-discharge VTE: 0.16%  Other patient-specific factors to consider: N/A  Recommendation for Discharge: No pharmacologic prophylaxis post-discharge   Debra Miller is a 40 y.o. female who underwent gastric bypass on 05/24/2021.   Allergies  Allergen Reactions   Ceclor [Cefaclor] Hives   Pediapred [Prednisolone Sodium Phosphate] Hives    Patient Measurements: Height: 5' 3.5" (161.3 cm) Weight: (P) 128.5 kg (283 lb 6.4 oz) IBW/kg (Calculated) : 53.55 Body mass index is 49.41 kg/m (pended).  No results for input(s): WBC, HGB, HCT, PLT, APTT, CREATININE, LABCREA, CREATININE, CREAT24HRUR, MG, PHOS, ALBUMIN, PROT, ALBUMIN, AST, ALT, ALKPHOS, BILITOT, BILIDIR, IBILI in the last 72 hours.  Estimated Creatinine Clearance: 125.8 mL/min (by C-G formula based on SCr of 0.63 mg/dL).   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   Depression    Diabetes mellitus without complication (HCC)    type II   Dizziness    Dysmenorrhea    Eczema    Elevated triglycerides with high cholesterol    Frequent headaches    GERD (gastroesophageal reflux disease)    H/O cold sores    High cholesterol    Hypertension    high blood pressure readings    Migraines    with aura   Obesity    PCOS (polycystic ovarian  syndrome)    Seasonal allergies    Sleep apnea    Urine incontinence    UTI (urinary tract infection)     Medications Prior to Admission  Medication Sig Dispense Refill Last Dose   albuterol (VENTOLIN HFA) 108 (90 Base) MCG/ACT inhaler TAKE 2 PUFFS BY MOUTH EVERY 6 HOURS AS NEEDED FOR WHEEZE OR SHORTNESS OF BREATH 20.1 each 1 Past Month   busPIRone (BUSPAR) 10 MG tablet TAKE 1 TABLET BY MOUTH TWICE A DAY 180 tablet 1 05/23/2021   citalopram (CELEXA) 20 MG tablet Take 20 mg by mouth daily.   05/23/2021   citalopram (CELEXA) 40 MG tablet Take 1 tablet (40 mg total) by mouth daily. 90 tablet 3 05/23/2021   Cyanocobalamin (B-12 COMPLIANCE INJECTION IJ) Inject 1 Dose as directed every 30 (thirty) days.   Past Month   FLOVENT HFA 110 MCG/ACT inhaler INHALE 2 PUFFS INTO THE LUNGS TWICE A DAY 36 each 3 05/23/2021   gabapentin (NEURONTIN) 300 MG capsule TAKE 3 CAPSULES (900 MG TOTAL) BY MOUTH 3 (THREE) TIMES DAILY. 810 capsule 3 05/24/2021 at 0730   metFORMIN (GLUCOPHAGE-XR) 750 MG 24 hr tablet TAKE 2 TABLETS (1,500 MG TOTAL) BY MOUTH DAILY WITH BREAKFAST. 180 tablet 1 05/23/2021   montelukast (SINGULAIR) 10 MG tablet TAKE 1 TABLET BY MOUTH EVERYDAY AT BEDTIME 90 tablet 1 05/23/2021  omeprazole (PRILOSEC) 40 MG capsule TAKE 1 CAPSULE (40 MG TOTAL) BY MOUTH DAILY. 90 capsule 2 05/23/2021   PEG-KCl-NaCl-NaSulf-Na Asc-C (PLENVU) 140 g SOLR Take 140 g by mouth as directed. Manufacturer's coupon Universal coupon code:BIN: P2366821; GROUP: [id number]; PCN: [id number]; ID: [id number]; PAY NO MORE $50 1 each 0 Past Month   Probiotic Product (PROBIOTIC PO) Take 1 capsule by mouth daily.   05/23/2021   Semaglutide, 1 MG/DOSE, (OZEMPIC, 1 MG/DOSE,) 4 MG/3ML SOPN Inject 1 mg into the skin once a week. 9 mL 3 05/23/2021   spironolactone (ALDACTONE) 100 MG tablet TAKE 2 TABLETS BY MOUTH EVERY DAY 180 tablet 3 05/23/2021   Vitamin D, Ergocalciferol, (DRISDOL) 1.25 MG (50000 UNIT) CAPS capsule TAKE 1 CAPSULE (50,000 UNITS  TOTAL) BY MOUTH EVERY 7 (SEVEN) DAYS 12 capsule 0 05/23/2021   dicyclomine (BENTYL) 10 MG capsule TAKE 2 CAPSULES (20 MG TOTAL) BY MOUTH IN THE MORNING, AT NOON, IN THE EVENING, AND AT BEDTIME. 240 capsule 1    Galcanezumab-gnlm (EMGALITY) 120 MG/ML SOAJ Inject 62ml monthly 4 mL 3 More than a month   SUMAtriptan (IMITREX) 50 MG tablet TAKE 1 TABLET (50 MG TOTAL) BY MOUTH EVERY 2 (TWO) HOURS AS NEEDED FOR MIGRAINE. MAY REPEAT IN 2 HOURS IF HEADACHE PERSISTS OR RECURS. 10 tablet 0 More than a month    Tanay Misuraca A Jennings Corado 05/24/2021,1:32 PM

## 2021-05-24 NOTE — Anesthesia Postprocedure Evaluation (Signed)
Anesthesia Post Note  Patient: Debra Miller  Procedure(s) Performed: ROBOTIC GASTRIC BYPASS ROUX EN Y WITH UPPER ENDOSCOPY (Abdomen)     Patient location during evaluation: PACU Anesthesia Type: General Level of consciousness: awake and alert Pain management: pain level controlled Vital Signs Assessment: post-procedure vital signs reviewed and stable Respiratory status: spontaneous breathing, nonlabored ventilation, respiratory function stable and patient connected to nasal cannula oxygen Cardiovascular status: blood pressure returned to baseline and stable Postop Assessment: no apparent nausea or vomiting Anesthetic complications: no   No notable events documented.  Last Vitals:  Vitals:   05/24/21 1515 05/24/21 1548  BP: (!) 173/90 (!) 170/88  Pulse: 68 66  Resp: 18 18  Temp:  36.9 C  SpO2: 98% 96%    Last Pain:  Vitals:   05/24/21 1605  TempSrc:   PainSc: 6                  Tiajuana Amass

## 2021-05-24 NOTE — Op Note (Signed)
Patient: Debra Miller (07-26-1980, [id number])  Date of Surgery: 05/24/2021   Preoperative Diagnosis: Morbid obesity   Postoperative Diagnosis: Morbid obesity   Surgical Procedure: ROBOTIC GASTRIC BYPASS ROUX EN Y WITH UPPER ENDOSCOPY:    Operative Team Members:  Surgeon(s) and Role:    * Wende Longstreth, Nickola Major, MD - Primary    * Clovis Riley, MD - Assisting   Anesthesiologist: Suzette Battiest, MD CRNA: Gean Maidens, CRNA; Claudia Desanctis, CRNA; Maxwell Caul, CRNA   Anesthesia: General   Fluids:  Total I/O In: 1150 [I.V.:1000; IV [id number]:195] Out: -   Complications: None  Drains:  none   Specimen: None  Disposition:  PACU - hemodynamically stable.  Plan of Care: Admit to inpatient     Indications for Procedure:   Debra Miller is a 40 y.o. female who was seen as an office consultation at the request of Dr. [name] for bariatric surgery consultation.  The patient has morbid obesity with a BMI of Body mass index is 49 kg/m and the following conditions related to obesity: Gastroesophageal reflux disease, hypertension, hyperlipidemia, type 2 diabetes, obstructive sleep apnea, and polycystic ovarian syndrome.   We discussed the surgical options to treat obesity and its associated comorbidity.  After discussing the available procedures in the region, we discussed in great detail the surgeries I offer: robotic sleeve gastrectomy and robotic roux-en-y gastric bypass.  We discussed the procedures themselves as well as their risks, benefits and alternatives.  I entered the patient's basic information into the [facility] Metabolic Surgery Risk/Benefit Calculator to facilitate this discussion.     After a full discussion and all questions answered, the patient is interested in pursuing a robotic gastric bypass.   She has completed the preoperative workup including the following: - Bloodwork - Dietician consult - Chest x-ray 09/02/20 - No radiographic evidence of  acute cardiopulmonary disease. - EKG - 01/19/21 - Cardiology evaluation prior to surgery as she states she deals with some chest pain and shortness of breath on a regular basis. Completed with Dr. Johnsie Cancel on 01/19/21.  Echo 02/08/21.  CT Cardiac 02/08/21 - calcium score of 0.  Considered low cardiac risk for bariatric surgery - Psychology evaluation - completed with Dr. Ardath Sax   She already had an upper endoscopy recently and she is already on a CPAP machine for sleep apnea.   Upper Endoscopy with Dr. Tarri Glenn 10/30/20: - LA Grade A reflux esophagitis with no bleeding. Biopsied. - Erosive gastropathy with no bleeding and no stigmata of recent bleeding. Biopsied. - Normal examined duodenum. Biopsied. - The examination was otherwise normal. Biopsies with mild reflux esophagitis. Negative for H. Pylori   With the above workup complete, it appears she is a good candidate for a robotic gastric bypass. We discussed the risks, benefits and alternatives again today and I provided the [facility] risk/benefit calculator to the patient. All questions answered. Will proceed with surgery on 05/24/21.        Findings: Normal anatomy  Infection status: Patient: Private Patient Elective Case Case: Elective Infection Present At Time Of Surgery (PATOS): Some spillage of foregut  and jejunal contents while creating anastomoses   Description of Procedure:   On the date stated above, the patient was taken to the operating room suite and placed in supine positioning.  General endotracheal anesthesia was induced.  A timeout was completed verifying the correct patient, procedure, positioning and equipment needed for the case.  The patient's abdomen was prepped and draped in the usual  sterile fashion.  A 5 mm trocar was inserted 22 cm down from the xiphoid and 4-6 cm to the left of midline using the optical technique.  There was no trauma to the underlying viscera with initial trocar placement.  Four robotic trocars were  placed across the abdomen at this level.  The robotic stapling trocar was placed in the right most position.  The 5 mm trocar was replaced at this level with a 8 mm robotic trocar.  A 12 mm balloon assistant trocar was placed in the left lower quadrant tunneling through the rectus muscle.  The [facility] liver retractor was placed through the subxiphoid region and under the left lobe of the liver and was connected to the rail of the bed.  A TAP block was placed using marcaine and Exparel under direct vision of the laparoscope.  The [place] was docked and we transitioned to robotic surgery.  Using the tip up grasper, fenestrated bipolar, 30 degree camera and Synchroseal from the patient's right to left, we began by dissecting the angle of His off the left crus of the diaphragm.  The adhesions between the stomach, spleen and diaphragm were divided using the Synchroseal to define the angle of His.  I then started ~6 cm down on the lesser curve of the stomach and created a defect in the gastrohepatic ligament tracking behind the lesser curve of the stomach to enter the lesser sac.  I then used multiple white loads of the robotic 60 mm Sureform linear stapler to form the gastric pouch.  I then directed my attention to the lower abdomen.  The omentum was divided with the Synchroseal and I identified the ligament of treitz.  The jejunum was run to a point 50 cm from the ligament of Treitz.  This loop of bowel was then brought into the left upper quadrant, over the transverse colon, between the split omentum.  A 2-0 v-loc suture was used to create the posterior outer row of the gastrojejunal anastomosis.  A window was made in the jejunal mesentery and the jejunum was divided just proximal to the gastrojejunal anastomosis using a white load of the robotic 60 mm Sureform linear stapler to divide the roux limb from the hepatobiliary limb.  I then continued to create the gastrojejunal anastomosis.  An  approximately 2 cm gastrotomy was made in the pouch and a matching 2 cm enterotomy was created in the roux limb.  Then, 3-0 v-loc was used to create a posterior, inner, full thickness layer of the anastomosis.  A second 3-0 v-loc was used to create an anterior, inner, full thickness layer of the anastomosis.  While sewing the anterior inner layer, the Ewald tube was passed through the anastomosis to ensure patency.  The outer, anterior layer was then created using 2-0 v-loc suture.  At this point the gastrojejunal anastomosis was complete and the Ewald tube was removed.  The roux limb was then run 100 cm from the gastrojejunal anastomosis.  This loop of bowel was brought into the left upper quadrant for anastomosis to the hepatobiliary limb.  A vicryl stay suture was placed from the antimesenteric border of the hepatobiliary limb to the roux limb.  Enterotomies were created in both limbs using the monopolar scissors.  A robotic 60 mm white load on the Sureform linear stapler was introduced into both limbs and fired to create the jejunojejunostomy.  A second 60 mm white load was fired to connect the roux limb to the  common channel creating a W shaped anastomosis.  The anastomosis was inspected, then the common enterotomy was closed using a 60 mm white load of the stapler. The jejunojejunostomy mesenteric defect was closed using running 0-Ethibond suture.  The retro-roux defect was closed, closing the roux limb mesentery to the transverse mesocolon mesentery using a running 0-Ethibond suture.   I ran the roux limb from the gastrojejunal anastomosis to the jejunojejunostomy and found the anatomy as expected without any twisting of the mesentery.   I then transitioned to endoscopic portion of the procedure.  The adult upper endoscope was inserted into the pouch.  The pouch appeared appropriately sized.  There was good intra-luminal hemostasis.  The endoscope was inserted through the anastomosis into the roux limb.   The anastomosis appeared well formed without any stricture.  The anastomosis was submerged in irrigation in the left upper quadrant and there was no leakage of air bubbles with endoscopic insufflation suggesting a negative leak test and an air tight anastomosis.    The foregut was decompressed with the endoscope and the endoscope was removed.  The small bowel was released by the tip-up grasper and then the robotic instruments were removed and the robot was undocked.  The stapler port in the right abdomen was closed at the fascial level using 0-vicryl on a PMI suture passer.  The 12 mm assistant port was not closed as it was tunneled through the abdominal wall.  The liver retractor was removed under direct vision.  The pneumoperitoneum was evacuated.  The skin was closed using 4-0 Monocryl and Dermabond.  All sponge and needle counts were correct at the end of the case.    Louanna Raw, MD General, Bariatric, & Minimally Invasive Surgery [facility], [place]

## 2021-05-24 NOTE — Anesthesia Preprocedure Evaluation (Signed)
Anesthesia Evaluation  Patient identified by MRN, date of birth, ID band Patient awake    Reviewed: Allergy & Precautions, NPO status , Patient's Chart, lab work & pertinent test results  History of Anesthesia Complications Negative for: history of anesthetic complications  Airway Mallampati: III  TM Distance: >3 FB Neck ROM: Full    Dental  (+) Dental Advisory Given, Teeth Intact   Pulmonary asthma , sleep apnea and Continuous Positive Airway Pressure Ventilation ,    Pulmonary exam normal        Cardiovascular hypertension, Pt. on medications Normal cardiovascular exam     Neuro/Psych  Headaches, PSYCHIATRIC DISORDERS Anxiety Depression Bipolar Disorder    GI/Hepatic Neg liver ROS, GERD  Controlled and Medicated,  Endo/Other  diabetes, Type 2, Oral Hypoglycemic AgentsMorbid obesity  Renal/GU negative Renal ROS     Musculoskeletal negative musculoskeletal ROS (+)   Abdominal   Peds  Hematology negative hematology ROS (+)   Anesthesia Other Findings   Reproductive/Obstetrics  PCOS                              Lab Results  Component Value Date   WBC 7.3 05/14/2021   HGB 14.3 05/14/2021   HCT 43.7 05/14/2021   MCV 89.2 05/14/2021   PLT 276 05/14/2021   Lab Results  Component Value Date   CREATININE 0.63 05/14/2021   BUN 18 05/14/2021   NA 135 05/14/2021   K 4.3 05/14/2021   CL 104 05/14/2021   CO2 22 05/14/2021    Anesthesia Physical  Anesthesia Plan  ASA: 3  Anesthesia Plan: General   Post-op Pain Management:    Induction: Intravenous  PONV Risk Score and Plan: 3 and Treatment may vary due to age or medical condition, Ondansetron, Dexamethasone and Midazolam  Airway Management Planned: Oral ETT and Video Laryngoscope Planned  Additional Equipment: None  Intra-op Plan:   Post-operative Plan: Extubation in OR  Informed Consent: I have reviewed the patients  History and Physical, chart, labs and discussed the procedure including the risks, benefits and alternatives for the proposed anesthesia with the patient or authorized representative who has indicated his/her understanding and acceptance.     Dental advisory given  Plan Discussed with: CRNA and Anesthesiologist  Anesthesia Plan Comments:         Anesthesia Quick Evaluation

## 2021-05-24 NOTE — Transfer of Care (Signed)
Immediate Anesthesia Transfer of Care Note  Patient: Debra Miller  Procedure(s) Performed: ROBOTIC GASTRIC BYPASS ROUX EN Y WITH UPPER ENDOSCOPY (Abdomen)  Patient Location: PACU  Anesthesia Type:General  Level of Consciousness: awake, alert  and oriented  Airway & Oxygen Therapy: Patient Spontanous Breathing and Patient connected to face mask oxygen  Post-op Assessment: Report given to RN and Post -op Vital signs reviewed and stable  Post vital signs: Reviewed and stable  Last Vitals:  Vitals Value Taken Time  BP 182/103 05/24/21 1331  Temp    Pulse    Resp 17 05/24/21 1333  SpO2    Vitals shown include unvalidated device data.  Last Pain:  Vitals:   05/24/21 0931  TempSrc:   PainSc: 6       Patients Stated Pain Goal: 5 (70/76/15 1834)  Complications: No notable events documented.

## 2021-05-24 NOTE — Progress Notes (Signed)

## 2021-05-24 NOTE — Progress Notes (Signed)
Started water at 1700 but the patient has not drank any at this time as she is sleeping.

## 2021-05-24 NOTE — H&P (Signed)
Admitting Physician: [place]  Service: Bariatric surgery  CC: Morbid obesity  Subjective   HPI: Debra Miller is an 40 y.o. female who is here for elective robotic gastric bypass.  Past Medical History:  Diagnosis Date   Anxiety    Asthma    Atypical mole    Bipolar disorder ([place])    Chicken pox    CIN III (cervical intraepithelial neoplasia grade III) with severe dysplasia 10/2004   Depression    Diabetes mellitus without complication (HCC)    type II   Dizziness    Dysmenorrhea    Eczema    Elevated triglycerides with high cholesterol    Frequent headaches    GERD (gastroesophageal reflux disease)    H/O cold sores    High cholesterol    Hypertension    high blood pressure readings    Migraines    with aura   Obesity    PCOS (polycystic ovarian syndrome)    Seasonal allergies    Sleep apnea    Urine incontinence    UTI (urinary tract infection)     Past Surgical History:  Procedure Laterality Date   KNEE SURGERY Left 2018   RADIOLOGY WITH ANESTHESIA N/A 09/22/2020   Procedure: MRI WITH ANESTHESIA CERVICAL SPINE WITHOUT AND BRAIN WITHOUT;  Surgeon: Radiologist, Medication, MD;  Location: [facility];  Service: Radiology;  Laterality: N/A;   RADIOLOGY WITH ANESTHESIA N/A 05/18/2021   Procedure: MRI WITH ANESTHESIA  THORACIC WITHOUT CONTRAST AND LUMBAR WITHOUT CONTRAST;  Surgeon: Radiologist, Medication, MD;  Location: [facility] [address];  Service: Radiology;  Laterality: N/A;   TONSILLECTOMY AND ADENOIDECTOMY     TUBAL LIGATION  2008    Family History  Problem Relation Age of Onset   Arthritis Mother    High Cholesterol Mother    Hypertension Mother    Diabetes Mother    Rheum arthritis Mother    Asthma Mother    Hyperlipidemia Mother    Migraines Mother    Thyroid disease Mother    Colon polyps Mother    Arthritis Maternal Grandmother    Breast cancer Maternal Grandmother    High Cholesterol Maternal Grandmother    Stroke Maternal Grandmother     Hypertension Maternal Grandmother    Migraines Maternal Grandmother    Colon polyps Sister    Colon polyps Maternal Aunt    Colon cancer Neg Hx    Esophageal cancer Neg Hx    Rectal cancer Neg Hx    Stomach cancer Neg Hx     Social:  reports that she has never smoked. She has never used smokeless tobacco. She reports that she does not drink alcohol and does not use drugs.  Allergies:  Allergies  Allergen Reactions   Ceclor [Cefaclor] Hives   Pediapred [Prednisolone Sodium Phosphate] Hives    Medications: Current Outpatient Medications  Medication Instructions   albuterol (VENTOLIN HFA) 108 (90 Base) MCG/ACT inhaler TAKE 2 PUFFS BY MOUTH EVERY 6 HOURS AS NEEDED FOR WHEEZE OR SHORTNESS OF BREATH   busPIRone (BUSPAR) 10 MG tablet TAKE 1 TABLET BY MOUTH TWICE A DAY   citalopram (CELEXA) 40 mg, Oral, Daily   citalopram (CELEXA) 20 mg, Oral, Daily   Cyanocobalamin (B-12 COMPLIANCE INJECTION IJ) 1 Dose, Injection, Every 30 days   dicyclomine (BENTYL) 20 mg, Oral, 4 times daily   FLOVENT HFA 110 MCG/ACT inhaler INHALE 2 PUFFS INTO THE LUNGS TWICE A DAY   gabapentin (NEURONTIN) 300 MG capsule TAKE 3 CAPSULES (900  MG TOTAL) BY MOUTH 3 (THREE) TIMES DAILY.   Galcanezumab-gnlm (EMGALITY) 120 MG/ML SOAJ Inject 43ml monthly   metFORMIN (GLUCOPHAGE-XR) 1,500 mg, Oral, Daily with breakfast   montelukast (SINGULAIR) 10 MG tablet TAKE 1 TABLET BY MOUTH EVERYDAY AT BEDTIME   omeprazole (PRILOSEC) 40 mg, Oral, Daily   Ozempic (1 MG/DOSE) 1 mg, Subcutaneous, Weekly   PEG-KCl-NaCl-NaSulf-Na Asc-C (PLENVU) 140 g SOLR 140 g, Oral, As directed, Manufacturer's coupon Universal coupon code:BIN: 732202; GROUP: [id number]; PCN: [id number]; ID: [id number]; PAY NO MORE $50   Probiotic Product (PROBIOTIC PO) 1 capsule, Oral, Daily   spironolactone (ALDACTONE) 100 MG tablet TAKE 2 TABLETS BY MOUTH EVERY DAY   SUMAtriptan (IMITREX) 50 mg, Oral, Every 2 hours PRN, May repeat in 2 hours if headache persists or recurs.    Vitamin D (Ergocalciferol) (DRISDOL) 50,000 Units, Oral, Every 7 days    ROS - all of the below systems have been reviewed with the patient and positives are indicated with bold text General: chills, fever or night sweats Eyes: blurry vision or double vision ENT: epistaxis or sore throat Allergy/Immunology: itchy/watery eyes or nasal congestion Hematologic/Lymphatic: bleeding problems, blood clots or swollen lymph nodes Endocrine: temperature intolerance or unexpected weight changes Breast: new or changing breast lumps or nipple discharge Resp: cough, shortness of breath, or wheezing CV: chest pain or dyspnea on exertion GI: as per HPI GU: dysuria, trouble voiding, or hematuria MSK: joint pain or joint stiffness Neuro: TIA or stroke symptoms Derm: pruritus and skin lesion changes Psych: anxiety and depression  Objective   PE Last menstrual period 04/29/2021. Constitutional: NAD; conversant; no deformities Eyes: Moist conjunctiva; no lid lag; anicteric; PERRL Neck: Trachea midline; no thyromegaly Lungs: Normal respiratory effort; no tactile fremitus CV: RRR; no palpable thrills; no pitting edema GI: Abd Soft, non-tender; no palpable hepatosplenomegaly MSK: Normal range of motion of extremities; no clubbing/cyanosis Psychiatric: Appropriate affect; alert and oriented x3 Lymphatic: No palpable cervical or axillary lymphadenopathy  No results found for this or any previous visit (from the past 24 hour(s)).  Imaging Orders  No imaging studies ordered today     Assessment and Plan  Morbid obesity with body mass index (BMI) of 45-50 in adult (CMS-HCC)  Diabetes mellitus type 2 in obese (CMS-HCC)  Hyperlipidemia, unspecified hyperlipidemia type  Hypertension, unspecified type  Gastroesophageal reflux disease with esophagitis without hemorrhage  Obstructive sleep apnea  PCOS (polycystic ovarian syndrome)  Debra Miller is a 40 y.o. female who was seen as an office  consultation at the request of Dr. [name] for bariatric surgery consultation.  The patient has morbid obesity with a BMI of Body mass index is 49 kg/m and the following conditions related to obesity: Gastroesophageal reflux disease, hypertension, hyperlipidemia, type 2 diabetes, obstructive sleep apnea, and polycystic ovarian syndrome.   We discussed the surgical options to treat obesity and its associated comorbidity.  After discussing the available procedures in the region, we discussed in great detail the surgeries I offer: robotic sleeve gastrectomy and robotic roux-en-y gastric bypass.  We discussed the procedures themselves as well as their risks, benefits and alternatives.  I entered the patient's basic information into the [facility] [employer] [facility] Metabolic Surgery Risk/Benefit Calculator to facilitate this discussion.     After a full discussion and all questions answered, the patient is interested in pursuing a robotic gastric bypass.   She has completed the preoperative workup including the following: - Bloodwork - Dietician consult - Chest x-ray 09/02/20 - No radiographic evidence of acute cardiopulmonary disease. -  EKG - 01/19/21 - Cardiology evaluation prior to surgery as she states she deals with some chest pain and shortness of breath on a regular basis. Completed with Dr. Johnsie Cancel on 01/19/21.  Echo 02/08/21.  CT Cardiac 02/08/21 - calcium score of 0.  Considered low cardiac risk for bariatric surgery - Psychology evaluation - completed with Dr. Ardath Sax   She already had an upper endoscopy recently and she is already on a CPAP machine for sleep apnea.   Upper Endoscopy with Dr. Tarri Glenn 10/30/20: - LA Grade A reflux esophagitis with no bleeding. Biopsied. - Erosive gastropathy with no bleeding and no stigmata of recent bleeding. Biopsied. - Normal examined duodenum. Biopsied. - The examination was otherwise normal. Biopsies with mild reflux esophagitis. Negative for H. Pylori   With the above workup  complete, it appears she is a good candidate for a robotic gastric bypass. We discussed the risks, benefits and alternatives again today and I provided the [facility] risk/benefit calculator to the patient. All questions answered. Will proceed with surgery on 05/24/21.    Felicie Morn, MD  [facility] Use [email or web address] to contact on call provider

## 2021-05-24 NOTE — Discharge Instructions (Signed)

## 2021-05-25 ENCOUNTER — Other Ambulatory Visit (HOSPITAL_COMMUNITY): Payer: Self-pay

## 2021-05-25 LAB — CBC WITH DIFFERENTIAL/PLATELET
Abs Immature Granulocytes: 0.05 10*3/uL (ref 0.00–0.07)
Basophils Absolute: 0 10*3/uL (ref 0.0–0.1)
Basophils Relative: 0 %
Eosinophils Absolute: 0 10*3/uL (ref 0.0–0.5)
Eosinophils Relative: 0 %
HCT: 38.9 % (ref 36.0–46.0)
Hemoglobin: 13 g/dL (ref 12.0–15.0)
Immature Granulocytes: 1 %
Lymphocytes Relative: 15 %
Lymphs Abs: 1.3 10*3/uL (ref 0.7–4.0)
MCH: 29.7 pg (ref 26.0–34.0)
MCHC: 33.4 g/dL (ref 30.0–36.0)
MCV: 89 fL (ref 80.0–100.0)
Monocytes Absolute: 0.6 10*3/uL (ref 0.1–1.0)
Monocytes Relative: 7 %
Neutro Abs: 7.1 10*3/uL (ref 1.7–7.7)
Neutrophils Relative %: 77 %
Platelets: 249 10*3/uL (ref 150–400)
RBC: 4.37 MIL/uL (ref 3.87–5.11)
RDW: 12.4 % (ref 11.5–15.5)
WBC: 9.1 10*3/uL (ref 4.0–10.5)
nRBC: 0 % (ref 0.0–0.2)

## 2021-05-25 LAB — GLUCOSE, CAPILLARY
Glucose-Capillary: 141 mg/dL — ABNORMAL HIGH (ref 70–99)
Glucose-Capillary: 114 mg/dL — ABNORMAL HIGH (ref 70–99)
Glucose-Capillary: 117 mg/dL — ABNORMAL HIGH (ref 70–99)
Glucose-Capillary: 98 mg/dL (ref 70–99)

## 2021-05-25 MED ORDER — ACETAMINOPHEN 500 MG PO TABS: 1000.0000 mg | ORAL_TABLET | Freq: Three times a day (TID) | ORAL | 0 refills | Status: AC

## 2021-05-25 MED ORDER — ONDANSETRON 4 MG PO TBDP
4.0000 mg | ORAL_TABLET | Freq: Four times a day (QID) | ORAL | 2 refills | Status: DC | PRN
  Filled 2021-05-25: qty 20, 5d supply, fill #0

## 2021-05-25 MED ORDER — OXYCODONE HCL 5 MG PO TABS
5.0000 mg | ORAL_TABLET | Freq: Four times a day (QID) | ORAL | 0 refills | Status: DC | PRN
  Filled 2021-05-25: qty 10, 3d supply, fill #0

## 2021-05-25 NOTE — Progress Notes (Signed)
Patient alert and oriented, pain is controlled. Patient is still working on water cups, plan to advance to protein shake today. Reviewed Gastric Bypass discharge instructions with patient and patient is able to articulate understanding. Provided information on BELT program, Support Group and [facility] outpatient pharmacy. All questions answered. Pt plans to walk and sit up in chair this morning.  Will continue to encourage PO intake. Will continue to monitor.

## 2021-05-25 NOTE — Progress Notes (Signed)
Pt is attempting to take sips of water, but has c/o nausea throughout shift. Given Zofran, some relief. Patient currently on 3rd cup of water.

## 2021-05-25 NOTE — Progress Notes (Signed)
Progress Note: General Surgery Service   Chief Complaint/Subjective: Nausea and incisional pain since surgery.  Objective: Vital signs in last 24 hours: Temp:  [98.5 F (36.9 C)-99.1 F (37.3 C)] 99.1 F (37.3 C) (11/22 0202) Pulse Rate:  [57-95] 62 (11/22 0520) Resp:  [9-19] 18 (11/22 0520) BP: (134-189)/(67-103) 146/72 (11/22 0520) SpO2:  [91 %-100 %] 97 % (11/22 0520) Weight:  [128.5 kg-129.6 kg] 129.6 kg (11/22 0520)    Intake/Output from previous day: 11/21 0701 - 11/22 0700 In: 2496.2 [P.O.:180; I.V.:2166.2; IV Piggyback:150] Out: 2420 [Urine:2400; Blood:20] Intake/Output this shift: No intake/output data recorded.  Constitutional: NAD; conversant; no deformities Eyes: Moist conjunctiva; no lid lag; anicteric; PERRL Neck: Trachea midline; no thyromegaly Lungs: Normal respiratory effort; no tactile fremitus CV: RRR; no palpable thrills; no pitting edema GI: Abd Incisions c/d/I w/ glue; no palpable hepatosplenomegaly MSK: Normal range of motion of extremities; no clubbing/cyanosis Psychiatric: Appropriate affect; alert and oriented x3 Lymphatic: No palpable cervical or axillary lymphadenopathy  Lab Results: CBC  Recent Labs    05/24/21 1704 05/25/21 0355  WBC 12.8* 9.1  HGB 13.8 13.0  HCT 42.0 38.9  PLT 247 249   BMET Recent Labs    05/24/21 1704  CREATININE 0.59   PT/INR No results for input(s): LABPROT, INR in the last 72 hours. ABG No results for input(s): PHART, HCO3 in the last 72 hours.  Invalid input(s): PCO2, PO2  Anti-infectives: Anti-infectives (From admission, onward)    Start     Dose/Rate Route Frequency Ordered Stop   05/24/21 0915  levofloxacin (LEVAQUIN) IVPB 750 mg        750 mg 100 mL/hr over 90 Minutes Intravenous On call to O.R. 05/24/21 0910 05/24/21 1112       Medications: Scheduled Meds:  acetaminophen  1,000 mg Oral Q8H   Or   acetaminophen (TYLENOL) oral liquid 160 mg/5 mL  1,000 mg Oral Q8H   budesonide  0.25 mg  Nebulization BID   busPIRone  10 mg Oral BID   citalopram  20 mg Oral Daily   enoxaparin (LOVENOX) injection  30 mg Subcutaneous Q12H   gabapentin  300 mg Oral TID   insulin aspart  0-15 Units Subcutaneous TID WC   insulin aspart  0-5 Units Subcutaneous QHS   montelukast  10 mg Oral QHS   pantoprazole (PROTONIX) IV  40 mg Intravenous QHS   Ensure Max Protein  2 oz Oral Q2H   Continuous Infusions:  lactated ringers 75 mL/hr at 05/24/21 1822   PRN Meds:.albuterol, morphine injection, ondansetron (ZOFRAN) IV, oxyCODONE, simethicone, SUMAtriptan  Assessment/Plan: s/p Procedure(s): ROBOTIC GASTRIC BYPASS ROUX EN Y WITH UPPER ENDOSCOPY 05/24/2021  Continue bariatric surgery protocol. Once nausea passes and PO intake improves consider discharge - looking like tomorrow.   LOS: 1 day   FEN: IVF ID: None VTE: SCDs, Lovenox (pharmacy recommends no additional coverage at discharge) Foley: None Dispo: Continued care on floor.    Felicie Morn, MD  [facility] Use [email or web address] to contact on call provider

## 2021-05-26 LAB — CBC WITH DIFFERENTIAL/PLATELET
Abs Immature Granulocytes: 0.02 10*3/uL (ref 0.00–0.07)
Basophils Absolute: 0 10*3/uL (ref 0.0–0.1)
Basophils Relative: 0 %
Eosinophils Absolute: 0.2 10*3/uL (ref 0.0–0.5)
Eosinophils Relative: 2 %
HCT: 36.6 % (ref 36.0–46.0)
Hemoglobin: 12.1 g/dL (ref 12.0–15.0)
Immature Granulocytes: 0 %
Lymphocytes Relative: 42 %
Lymphs Abs: 4.1 10*3/uL — ABNORMAL HIGH (ref 0.7–4.0)
MCH: 30.4 pg (ref 26.0–34.0)
MCHC: 33.1 g/dL (ref 30.0–36.0)
MCV: 92 fL (ref 80.0–100.0)
Monocytes Absolute: 0.6 10*3/uL (ref 0.1–1.0)
Monocytes Relative: 6 %
Neutro Abs: 5 10*3/uL (ref 1.7–7.7)
Neutrophils Relative %: 50 %
Platelets: 233 10*3/uL (ref 150–400)
RBC: 3.98 MIL/uL (ref 3.87–5.11)
RDW: 12.7 % (ref 11.5–15.5)
WBC: 9.9 10*3/uL (ref 4.0–10.5)
nRBC: 0 % (ref 0.0–0.2)

## 2021-05-26 LAB — GLUCOSE, CAPILLARY
Glucose-Capillary: 97 mg/dL (ref 70–99)
Glucose-Capillary: 98 mg/dL (ref 70–99)

## 2021-05-26 NOTE — Progress Notes (Signed)
Patient alert and oriented, Post op day 2.  Provided support and encouragement.  Encouraged pulmonary toilet, ambulation and small sips of liquids.  All questions answered.  Will continue to monitor. 

## 2021-05-26 NOTE — Progress Notes (Signed)
24hr fluid recall: 468mL.  Per dehydration protocol, will call pt to f/u within one week post op.

## 2021-05-26 NOTE — Discharge Summary (Signed)
Patient ID: Debra Miller [id number] 40 y.o. 12/23/1980   05/24/2021   Discharge date and time: 05/26/2021   Admitting Physician: [place]   Discharge Physician: [place]   Admission Diagnoses: Morbid obesity with BMI of 45.0-49.9, adult ([place]) [E66.01, Z68.42]     Patient Active Problem List    Diagnosis Date Noted   Morbid obesity with BMI of 45.0-49.9, adult ([place]) 05/24/2021   Iron deficiency 04/13/2021   B12 deficiency 04/13/2021   GERD (gastroesophageal reflux disease) 04/13/2021   Dysesthesia affecting both sides of body 10/26/2020   Paresthesias 10/08/2020   Dysphagia 10/08/2020   Insulin resistance 04/22/2020   Anxiety 04/22/2020   RLS (restless legs syndrome) 01/04/2020   OSA (obstructive sleep apnea) 10/22/2019   Low vitamin D level 08/02/2019   PCOS (polycystic ovarian syndrome) 08/02/2019   Lumbar radiculopathy 08/02/2019   Essential hypertension 08/02/2019   Asthma 07/23/2019   Bipolar disorder, unspecified ([place]) 01/28/2013   Migraines 01/28/2013   Class 3 severe obesity without serious comorbidity with body mass index (BMI) of 45.0 to 49.9 in adult [facility]) 01/28/2013        Discharge Diagnoses: Morbid obesity     Patient Active Problem List    Diagnosis Date Noted   Morbid obesity with BMI of 45.0-49.9, adult ([place]) 05/24/2021   Iron deficiency 04/13/2021   B12 deficiency 04/13/2021   GERD (gastroesophageal reflux disease) 04/13/2021   Dysesthesia affecting both sides of body 10/26/2020   Paresthesias 10/08/2020   Dysphagia 10/08/2020   Insulin resistance 04/22/2020   Anxiety 04/22/2020   RLS (restless legs syndrome) 01/04/2020   OSA (obstructive sleep apnea) 10/22/2019   Low vitamin D level 08/02/2019   PCOS (polycystic ovarian syndrome) 08/02/2019   Lumbar radiculopathy 08/02/2019   Essential hypertension 08/02/2019   Asthma 07/23/2019   Bipolar disorder, unspecified ([place]) 01/28/2013   Migraines 01/28/2013   Class 3 severe  obesity without serious comorbidity with body mass index (BMI) of 45.0 to 49.9 in adult [facility]) 01/28/2013      Operations: Procedure(s): ROBOTIC GASTRIC BYPASS ROUX EN Y WITH UPPER ENDOSCOPY   Admission Condition: good   Discharged Condition: good   Indication for Admission: Morbid obesity   Hospital Course: Ms. Spikes presented for roux en y gastric bypass.  She recovered per protocol and was discharged.   Consults: None   Significant Diagnostic Studies: None   Treatments: surgery: as above   Disposition: Home   Patient Instructions:  Allergies as of 05/26/2021         Reactions    Ceclor [cefaclor] Hives    Pediapred [prednisolone Sodium Phosphate] Hives            Medication List       TAKE these medications     acetaminophen 500 MG tablet Commonly known as: TYLENOL Take 2 tablets (1,000 mg total) by mouth every 8 (eight) hours for 5 days.    albuterol 108 (90 Base) MCG/ACT inhaler Commonly known as: VENTOLIN HFA TAKE 2 PUFFS BY MOUTH EVERY 6 HOURS AS NEEDED FOR WHEEZE OR SHORTNESS OF BREATH    B-12 COMPLIANCE INJECTION IJ Inject 1 Dose as directed every 30 (thirty) days.    busPIRone 10 MG tablet Commonly known as: BUSPAR TAKE 1 TABLET BY MOUTH TWICE A DAY    citalopram 40 MG tablet Commonly known as: CELEXA Take 1 tablet (40 mg total) by mouth daily.    citalopram 20 MG tablet Commonly known as: CELEXA Take 20 mg by  mouth daily.    dicyclomine 10 MG capsule Commonly known as: BENTYL TAKE 2 CAPSULES (20 MG TOTAL) BY MOUTH IN THE MORNING, AT NOON, IN THE EVENING, AND AT BEDTIME.    Emgality 120 MG/ML Soaj Generic drug: Galcanezumab-gnlm Inject 14ml monthly    Flovent HFA 110 MCG/ACT inhaler Generic drug: fluticasone INHALE 2 PUFFS INTO THE LUNGS TWICE A DAY    gabapentin 300 MG capsule Commonly known as: NEURONTIN TAKE 3 CAPSULES (900 MG TOTAL) BY MOUTH 3 (THREE) TIMES DAILY.    metFORMIN 750 MG 24 hr tablet Commonly known as:  GLUCOPHAGE-XR TAKE 2 TABLETS (1,500 MG TOTAL) BY MOUTH DAILY WITH BREAKFAST. Notes to patient: Monitor Blood Sugar Frequently and keep a log for primary care physician, you may need to adjust medication dosage with rapid weight loss.      montelukast 10 MG tablet Commonly known as: SINGULAIR TAKE 1 TABLET BY MOUTH EVERYDAY AT BEDTIME    omeprazole 40 MG capsule Commonly known as: PRILOSEC TAKE 1 CAPSULE (40 MG TOTAL) BY MOUTH DAILY.    ondansetron 4 MG disintegrating tablet Commonly known as: ZOFRAN-ODT Dissolve 1 tablet (4 mg total) by mouth every 6 (six) hours as needed for nausea or vomiting.    oxyCODONE 5 MG immediate release tablet Commonly known as: Oxy IR/ROXICODONE Take 1 tablet (5 mg total) by mouth every 6 (six) hours as needed for severe pain.    Ozempic (1 MG/DOSE) 4 MG/3ML Sopn Generic drug: Semaglutide (1 MG/DOSE) Inject 1 mg into the skin once a week. Notes to patient: Monitor Blood Sugar Frequently and keep a log for primary care physician, you may need to adjust medication dosage with rapid weight loss.      Plenvu 140 g Solr Generic drug: PEG-KCl-NaCl-NaSulf-Na Asc-C Take 140 g by mouth as directed. Manufacturer's coupon Universal coupon code:BIN: P2366821; GROUP: [id number]; PCN: [id number]; ID: [id number]; PAY NO MORE $50    PROBIOTIC PO Take 1 capsule by mouth daily.    spironolactone 100 MG tablet Commonly known as: ALDACTONE TAKE 2 TABLETS BY MOUTH EVERY DAY Notes to patient: Monitor Blood Pressure Daily and keep a log for primary care physician.  Monitor for symptoms of dehydration.  You may need to make changes to your medications with rapid weight loss.      SUMAtriptan 50 MG tablet Commonly known as: IMITREX TAKE 1 TABLET (50 MG TOTAL) BY MOUTH EVERY 2 (TWO) HOURS AS NEEDED FOR MIGRAINE. MAY REPEAT IN 2 HOURS IF HEADACHE PERSISTS OR RECURS.    Vitamin D (Ergocalciferol) 1.25 MG (50000 UNIT) Caps capsule Commonly known as: DRISDOL TAKE 1 CAPSULE (50,000  UNITS TOTAL) BY MOUTH EVERY 7 (SEVEN) DAYS             Activity: no heavy lifting for 4 weeks Diet:  bariatric diet protocol Wound Care: keep wound clean and dry   Follow-up:  With Dr. Thermon Leyland per bariatric follow up protocol.   Signed: [place], [facility], [place]

## 2021-05-26 NOTE — Hospital Discharge Follow-Up (Deleted)
Patient ID: Debra Miller [id number] 40 y.o. 1981/06/07  05/24/2021  Discharge date and time: 05/26/2021  Admitting Physician: [place]  Discharge Physician: [place]  Admission Diagnoses: Morbid obesity with BMI of 45.0-49.9, adult ([place]) [E66.01, Z68.42] Patient Active Problem List   Diagnosis Date Noted   Morbid obesity with BMI of 45.0-49.9, adult ([place]) 05/24/2021   Iron deficiency 04/13/2021   B12 deficiency 04/13/2021   GERD (gastroesophageal reflux disease) 04/13/2021   Dysesthesia affecting both sides of body 10/26/2020   Paresthesias 10/08/2020   Dysphagia 10/08/2020   Insulin resistance 04/22/2020   Anxiety 04/22/2020   RLS (restless legs syndrome) 01/04/2020   OSA (obstructive sleep apnea) 10/22/2019   Low vitamin D level 08/02/2019   PCOS (polycystic ovarian syndrome) 08/02/2019   Lumbar radiculopathy 08/02/2019   Essential hypertension 08/02/2019   Asthma 07/23/2019   Bipolar disorder, unspecified ([place]) 01/28/2013   Migraines 01/28/2013   Class 3 severe obesity without serious comorbidity with body mass index (BMI) of 45.0 to 49.9 in adult [facility]) 01/28/2013     Discharge Diagnoses: Morbid obesity Patient Active Problem List   Diagnosis Date Noted   Morbid obesity with BMI of 45.0-49.9, adult ([place]) 05/24/2021   Iron deficiency 04/13/2021   B12 deficiency 04/13/2021   GERD (gastroesophageal reflux disease) 04/13/2021   Dysesthesia affecting both sides of body 10/26/2020   Paresthesias 10/08/2020   Dysphagia 10/08/2020   Insulin resistance 04/22/2020   Anxiety 04/22/2020   RLS (restless legs syndrome) 01/04/2020   OSA (obstructive sleep apnea) 10/22/2019   Low vitamin D level 08/02/2019   PCOS (polycystic ovarian syndrome) 08/02/2019   Lumbar radiculopathy 08/02/2019   Essential hypertension 08/02/2019   Asthma 07/23/2019   Bipolar disorder, unspecified ([place]) 01/28/2013   Migraines 01/28/2013   Class 3 severe obesity without  serious comorbidity with body mass index (BMI) of 45.0 to 49.9 in adult [facility]) 01/28/2013    Operations: Procedure(s): ROBOTIC GASTRIC BYPASS ROUX EN Y WITH UPPER ENDOSCOPY  Admission Condition: good  Discharged Condition: good  Indication for Admission: Morbid obesity  Hospital Course: Ms. Baray presented for roux en y gastric bypass.  She recovered per protocol and was discharged.  Consults: None  Significant Diagnostic Studies: None  Treatments: surgery: as above  Disposition: Home  Patient Instructions:  Allergies as of 05/26/2021       Reactions   Ceclor [cefaclor] Hives   Pediapred [prednisolone Sodium Phosphate] Hives        Medication List     TAKE these medications    acetaminophen 500 MG tablet Commonly known as: TYLENOL Take 2 tablets (1,000 mg total) by mouth every 8 (eight) hours for 5 days.   albuterol 108 (90 Base) MCG/ACT inhaler Commonly known as: VENTOLIN HFA TAKE 2 PUFFS BY MOUTH EVERY 6 HOURS AS NEEDED FOR WHEEZE OR SHORTNESS OF BREATH   B-12 COMPLIANCE INJECTION IJ Inject 1 Dose as directed every 30 (thirty) days.   busPIRone 10 MG tablet Commonly known as: BUSPAR TAKE 1 TABLET BY MOUTH TWICE A DAY   citalopram 40 MG tablet Commonly known as: CELEXA Take 1 tablet (40 mg total) by mouth daily.   citalopram 20 MG tablet Commonly known as: CELEXA Take 20 mg by mouth daily.   dicyclomine 10 MG capsule Commonly known as: BENTYL TAKE 2 CAPSULES (20 MG TOTAL) BY MOUTH IN THE MORNING, AT NOON, IN THE EVENING, AND AT BEDTIME.   Emgality 120 MG/ML Soaj Generic drug: Galcanezumab-gnlm Inject 31ml monthly  Flovent HFA 110 MCG/ACT inhaler Generic drug: fluticasone INHALE 2 PUFFS INTO THE LUNGS TWICE A DAY   gabapentin 300 MG capsule Commonly known as: NEURONTIN TAKE 3 CAPSULES (900 MG TOTAL) BY MOUTH 3 (THREE) TIMES DAILY.   metFORMIN 750 MG 24 hr tablet Commonly known as: GLUCOPHAGE-XR TAKE 2 TABLETS (1,500 MG TOTAL) BY MOUTH DAILY  WITH BREAKFAST. Notes to patient: Monitor Blood Sugar Frequently and keep a log for primary care physician, you may need to adjust medication dosage with rapid weight loss.     montelukast 10 MG tablet Commonly known as: SINGULAIR TAKE 1 TABLET BY MOUTH EVERYDAY AT BEDTIME   omeprazole 40 MG capsule Commonly known as: PRILOSEC TAKE 1 CAPSULE (40 MG TOTAL) BY MOUTH DAILY.   ondansetron 4 MG disintegrating tablet Commonly known as: ZOFRAN-ODT Dissolve 1 tablet (4 mg total) by mouth every 6 (six) hours as needed for nausea or vomiting.   oxyCODONE 5 MG immediate release tablet Commonly known as: Oxy IR/ROXICODONE Take 1 tablet (5 mg total) by mouth every 6 (six) hours as needed for severe pain.   Ozempic (1 MG/DOSE) 4 MG/3ML Sopn Generic drug: Semaglutide (1 MG/DOSE) Inject 1 mg into the skin once a week. Notes to patient: Monitor Blood Sugar Frequently and keep a log for primary care physician, you may need to adjust medication dosage with rapid weight loss.     Plenvu 140 g Solr Generic drug: PEG-KCl-NaCl-NaSulf-Na Asc-C Take 140 g by mouth as directed. Manufacturer's coupon Universal coupon code:BIN: P2366821; GROUP: [id number]; PCN: [id number]; ID: [id number]; PAY NO MORE $50   PROBIOTIC PO Take 1 capsule by mouth daily.   spironolactone 100 MG tablet Commonly known as: ALDACTONE TAKE 2 TABLETS BY MOUTH EVERY DAY Notes to patient: Monitor Blood Pressure Daily and keep a log for primary care physician.  Monitor for symptoms of dehydration.  You may need to make changes to your medications with rapid weight loss.     SUMAtriptan 50 MG tablet Commonly known as: IMITREX TAKE 1 TABLET (50 MG TOTAL) BY MOUTH EVERY 2 (TWO) HOURS AS NEEDED FOR MIGRAINE. MAY REPEAT IN 2 HOURS IF HEADACHE PERSISTS OR RECURS.   Vitamin D (Ergocalciferol) 1.25 MG (50000 UNIT) Caps capsule Commonly known as: DRISDOL TAKE 1 CAPSULE (50,000 UNITS TOTAL) BY MOUTH EVERY 7 (SEVEN) DAYS        Activity: no  heavy lifting for 4 weeks Diet:  bariatric diet protocol Wound Care: keep wound clean and dry  Follow-up:  With Dr. Thermon Leyland per bariatric follow up protocol.  Signed: Nickola Major Cherise Fedder General, Bariatric, & Minimally Invasive Surgery [facility], [place]   05/26/2021, 9:03 AM

## 2021-05-26 NOTE — Plan of Care (Signed)
Instructions were reviewed with patient. All questions were answered. Patient was transported to main entrance by wheelchair. ° °

## 2021-05-31 ENCOUNTER — Telehealth (HOSPITAL_COMMUNITY): Payer: Self-pay | Admitting: *Deleted

## 2021-05-31 NOTE — Telephone Encounter (Signed)
1.  Tell me about your pain and pain management? Pt c/o some intermittent "burning" pain, but does not need medication to alleviate symptoms.  Only takes a pain pill at night to help with sleep.  2.  Let's talk about fluid intake.  How much total fluid are you taking in? Pt states that she is getting in more than 64oz of fluid including protein shakes and bottled water.  Pt averages 83oz of fluid daily Pt instructed to assess status and suggestions daily utilizing Hydration Action Plan on discharge folder and to call [facility] if in the "red zone".   3.  How much protein have you taken in the last 2 days? Pt states she is meeting her goal of 60g of protein each day with the protein shakes.  4.  Have you had nausea?  Tell me about when have experienced nausea and what you did to help? Pt c/o intermittent nausea that is alleviated with medication.  Pt encouraged to decrease fluid intake by one 1/2 to 1 water bottle to see if that helps with nausea.   5.  Has the frequency or color changed with your urine? Pt states that she is urinating "fine" with no changes in frequency or urgency.     6.  Tell me what your incisions look like? "Incisions look fine". Pt denies a fever, chills.  Pt states incisions are not swollen, open, or draining.  Pt encouraged to call [facility] if incisions change.   7.  Have you been passing gas? BM? Pt states that she is having BMs. Last BM 05/29/21.  Pt states that she did have some diarrhea on 11/26, but has not had a BM since Saturday. Pt to call surgeon's office if develops diarrhea again.   8.  If a problem or question were to arise who would you call?  Do you know contact numbers for [place], [facility], and [facility]? Pt c/o fatigue with activity.  Pt can describe s/sx of dehydration.  Pt knows to call [facility] for surgical, [facility] for nutrition, and [employer] for non-urgent questions or concerns.   9.  How has the walking going? Pt states she is walking around and able to be active.   10. Are you  still using your incentive spirometer?  If so, how often? Pt states that she is still doing I.S. during the day. Pt encouraged to use incentive spirometer, at least 10x every hour while awake until she sees the surgeon.  11.  How are your vitamins and calcium going?  How are you taking them? Pt states that she is taking his/her supplements and vitamins without difficulty, just some nausea with the multivitamin.  Pt states that "everyone in her home is sick with COVID and the flu".  Pt states that she is self-isolating herself in her room.  Reminded patient that the first 30 days post-operatively are important for successful recovery.  Practice good hand hygiene, wearing a mask when having to interact with other family members, and to call the office if any symptoms occur and/or worsen.

## 2021-06-08 ENCOUNTER — Encounter: Payer: BC Managed Care – PPO | Attending: Surgery | Admitting: Dietician

## 2021-06-08 ENCOUNTER — Other Ambulatory Visit: Payer: Self-pay

## 2021-06-08 VITALS — Wt 267.8 lb

## 2021-06-08 DIAGNOSIS — E669 Obesity, unspecified: Secondary | ICD-10-CM | POA: Diagnosis not present

## 2021-06-08 NOTE — Progress Notes (Signed)
Post-Operative Nutrition Class  2 weeks post-op RnY Gastric Bypass Surgery  Appt start time: 1100 end time:  1130.  Anthropometrics:  Date 04/12/21 06/08/21  BMI 51.18 46.7  Weight (lbs) 288.9 267.8  Skeletal muscle (lbs)  32.0  % body fat  46.9    Instruction:   Diet advancement to include soft, solid protein foods. Importance of slow eating, small bites, chewing foods thoroughly. Avoiding fluids 15 minutes before, during, and 30 minutes after eating. Allowing 30 minutes for each meal. Importance of eating a meal or snack every 3-4 hours during the day.  Gradually reducing protein shakes as intake of solids increases. Advised use of tracking tool to monitor protein and fluid intake.  Reviewed vitamin and mineral supplementation. Gradually increasing light physical activity as tolerated and cleared by surgeon.   Teaching Method Utilized:  Visual Auditory Hands on   Materials provided: Phase 3 Food Plan (Protein only) Protein meal and snack ideas     Plan: Patient to return for 2 month post-op MNT 07/20/21

## 2021-06-10 ENCOUNTER — Other Ambulatory Visit: Payer: Self-pay

## 2021-06-10 ENCOUNTER — Ambulatory Visit (INDEPENDENT_AMBULATORY_CARE_PROVIDER_SITE_OTHER): Payer: BC Managed Care – PPO

## 2021-06-10 DIAGNOSIS — E538 Deficiency of other specified B group vitamins: Secondary | ICD-10-CM

## 2021-06-10 MED ORDER — CYANOCOBALAMIN 1000 MCG/ML IJ SOLN
1000.0000 ug | Freq: Once | INTRAMUSCULAR | Status: AC
  Administered 2021-06-10: 1000 ug via INTRAMUSCULAR

## 2021-06-10 NOTE — Progress Notes (Signed)
Debra Miller, 40 year old female presents in office today for b12 injection per [name], MD. Tolerated injection well, comes monthly.

## 2021-06-12 ENCOUNTER — Other Ambulatory Visit: Payer: Self-pay | Admitting: Family Medicine

## 2021-06-14 ENCOUNTER — Telehealth: Payer: Self-pay | Admitting: Skilled Nursing Facility1

## 2021-06-14 NOTE — Telephone Encounter (Signed)
RD called pt to verify fluid intake once starting soft, solid proteins 2 week post-bariatric surgery.   Daily Fluid intake:  Daily Protein intake: Bowel Habits:   Concerns/issues:    LVM 

## 2021-07-12 ENCOUNTER — Other Ambulatory Visit: Payer: Self-pay | Admitting: Family Medicine

## 2021-07-13 ENCOUNTER — Ambulatory Visit: Payer: BC Managed Care – PPO

## 2021-07-14 ENCOUNTER — Encounter: Payer: Self-pay | Admitting: Family Medicine

## 2021-07-14 ENCOUNTER — Other Ambulatory Visit: Payer: Self-pay

## 2021-07-14 ENCOUNTER — Telehealth: Payer: Self-pay

## 2021-07-14 ENCOUNTER — Ambulatory Visit (INDEPENDENT_AMBULATORY_CARE_PROVIDER_SITE_OTHER): Payer: BC Managed Care – PPO | Admitting: Family Medicine

## 2021-07-14 VITALS — BP 137/92 | HR 66 | Temp 98.4°F | Ht 63.5 in | Wt 252.6 lb

## 2021-07-14 DIAGNOSIS — R202 Paresthesia of skin: Secondary | ICD-10-CM

## 2021-07-14 DIAGNOSIS — F419 Anxiety disorder, unspecified: Secondary | ICD-10-CM

## 2021-07-14 DIAGNOSIS — Z6841 Body Mass Index (BMI) 40.0 and over, adult: Secondary | ICD-10-CM | POA: Diagnosis not present

## 2021-07-14 DIAGNOSIS — G43809 Other migraine, not intractable, without status migrainosus: Secondary | ICD-10-CM | POA: Diagnosis not present

## 2021-07-14 DIAGNOSIS — K219 Gastro-esophageal reflux disease without esophagitis: Secondary | ICD-10-CM

## 2021-07-14 DIAGNOSIS — E611 Iron deficiency: Secondary | ICD-10-CM | POA: Diagnosis not present

## 2021-07-14 DIAGNOSIS — Z9884 Bariatric surgery status: Secondary | ICD-10-CM | POA: Diagnosis not present

## 2021-07-14 DIAGNOSIS — R7989 Other specified abnormal findings of blood chemistry: Secondary | ICD-10-CM

## 2021-07-14 DIAGNOSIS — E538 Deficiency of other specified B group vitamins: Secondary | ICD-10-CM

## 2021-07-14 DIAGNOSIS — E282 Polycystic ovarian syndrome: Secondary | ICD-10-CM

## 2021-07-14 DIAGNOSIS — G4733 Obstructive sleep apnea (adult) (pediatric): Secondary | ICD-10-CM | POA: Diagnosis not present

## 2021-07-14 DIAGNOSIS — I10 Essential (primary) hypertension: Secondary | ICD-10-CM

## 2021-07-14 DIAGNOSIS — R21 Rash and other nonspecific skin eruption: Secondary | ICD-10-CM

## 2021-07-14 DIAGNOSIS — B001 Herpesviral vesicular dermatitis: Secondary | ICD-10-CM

## 2021-07-14 LAB — COMPREHENSIVE METABOLIC PANEL
ALT: 70 U/L — ABNORMAL HIGH (ref 0–35)
AST: 35 U/L (ref 0–37)
Albumin: 4.5 g/dL (ref 3.5–5.2)
Alkaline Phosphatase: 70 U/L (ref 39–117)
BUN: 16 mg/dL (ref 6–23)
CO2: 26 mEq/L (ref 19–32)
Calcium: 9.5 mg/dL (ref 8.4–10.5)
Chloride: 104 mEq/L (ref 96–112)
Creatinine, Ser: 0.65 mg/dL (ref 0.40–1.20)
GFR: 111.13 mL/min (ref >60.00)
Glucose, Bld: 98 mg/dL (ref 70–99)
Potassium: 3.6 mEq/L (ref 3.5–5.1)
Sodium: 138 mEq/L (ref 135–145)
Total Bilirubin: 0.5 mg/dL (ref 0.2–1.2)
Total Protein: 7 g/dL (ref 6.0–8.3)

## 2021-07-14 LAB — IBC + FERRITIN
Ferritin: 14.6 ng/mL (ref 10.0–291.0)
Iron: 67 ug/dL (ref 42–145)
Saturation Ratios: 14.3 % — ABNORMAL LOW (ref 20.0–50.0)
TIBC: 469 ug/dL — ABNORMAL HIGH (ref 250.0–450.0)
Transferrin: 335 mg/dL (ref 212.0–360.0)

## 2021-07-14 LAB — CBC
HCT: 42.8 % (ref 36.0–46.0)
Hemoglobin: 13.9 g/dL (ref 12.0–15.0)
MCHC: 32.3 g/dL (ref 30.0–36.0)
MCV: 87.3 fl (ref 78.0–100.0)
Platelets: 180 10*3/uL (ref 150.0–400.0)
RBC: 4.91 Mil/uL (ref 3.87–5.11)
RDW: 14 % (ref 11.5–15.5)
WBC: 7 10*3/uL (ref 4.0–10.5)

## 2021-07-14 LAB — LIPID PANEL
Cholesterol: 179 mg/dL (ref 0–200)
HDL: 29.6 mg/dL — ABNORMAL LOW (ref >39.00)
LDL Cholesterol: 110 mg/dL — ABNORMAL HIGH (ref 0–99)
NonHDL: 149.51
Total CHOL/HDL Ratio: 6
Triglycerides: 196 mg/dL — ABNORMAL HIGH (ref 0.0–149.0)
VLDL: 39.2 mg/dL (ref 0.0–40.0)

## 2021-07-14 LAB — TSH: TSH: 2.4 u[IU]/mL (ref 0.35–5.50)

## 2021-07-14 LAB — VITAMIN D 25 HYDROXY (VIT D DEFICIENCY, FRACTURES): VITD: 31.01 ng/mL (ref 30.00–100.00)

## 2021-07-14 LAB — VITAMIN B12: Vitamin B-12: >1550 pg/mL — ABNORMAL HIGH (ref 211–911)

## 2021-07-14 LAB — HEMOGLOBIN A1C: Hgb A1c MFr Bld: 5.3 % (ref 4.6–6.5)

## 2021-07-14 LAB — TESTOSTERONE: Testosterone: 46.71 ng/dL — ABNORMAL HIGH (ref 15.00–40.00)

## 2021-07-14 MED ORDER — KETOCONAZOLE 2 % EX CREA: 1.0000 "application " | TOPICAL_CREAM | Freq: Two times a day (BID) | CUTANEOUS | 0 refills | Status: DC

## 2021-07-14 MED ORDER — CYANOCOBALAMIN 1000 MCG/ML IJ SOLN
1000.0000 ug | Freq: Once | INTRAMUSCULAR | Status: AC
  Administered 2021-07-14: 1000 ug via INTRAMUSCULAR

## 2021-07-14 MED ORDER — VALACYCLOVIR HCL 1 G PO TABS: 1000.0000 mg | ORAL_TABLET | Freq: Every day | ORAL | 3 refills | Status: DC

## 2021-07-14 MED ORDER — BLOOD GLUCOSE MONITOR KIT: PACK | 0 refills | Status: DC

## 2021-07-14 NOTE — Assessment & Plan Note (Signed)
Her neurologic work-up thus far has been negative including imaging and nerve conduction studies.  She will follow-up with neurology soon.

## 2021-07-14 NOTE — Assessment & Plan Note (Signed)
She is down 30 pounds since bypass surgery 2 months ago.  We will stop Ozempic today due to side effects.  Continue metformin for now.  We will check labs.  She will continue working on diet and exercise.

## 2021-07-14 NOTE — Assessment & Plan Note (Signed)
We will continue current dose of spironolactone 200 mg daily and metformin 1500 mg daily.  We can check labs today.  She will follow-up with me in 3 months.  Hopefully she continues to lose weight we will be able to wean off some of her medications.

## 2021-07-14 NOTE — Telephone Encounter (Signed)
Spoke with pharmacy pharmacy stated  Patient advised to call insurance to check on coverage per pharmacy information  Patient aware, will call pharmacy for information

## 2021-07-14 NOTE — Assessment & Plan Note (Signed)
Continue CPAP.  

## 2021-07-14 NOTE — Assessment & Plan Note (Signed)
B12 injection given today.  We will check B12 level.

## 2021-07-14 NOTE — Patient Instructions (Signed)
It was very nice to see you today!  We will check blood work today.  Please take the Valtrex 1 pill daily to help prevent cold sores.  Please try using the ketoconazole cream to help with the rash.  I am glad that you are doing well after surgery.  We will take the omeprazole and Ozempic out of your medication list.  No other medication changes today.  We will see back in 3 months.  Please come back sooner if needed.  Take care, Dr [name]  PLEASE NOTE:  If you had any lab tests please let us know if you have not heard back within a few days. You may see your results on mychart before we have a chance to review them but we will give you a call once they are reviewed by [place]. If we ordered any referrals today, please let us know if you have not heard from their office within the next week.   Please try these tips to maintain a healthy lifestyle:  Eat at least 3 REAL meals and 1-2 snacks per day.  Aim for no more than 5 hours between eating.  If you eat breakfast, please do so within one hour of getting up.   Each meal should contain half fruits/vegetables, one quarter protein, and one quarter carbs (no bigger than a computer mouse)  Cut down on sweet beverages. This includes juice, soda, and sweet tea.   Drink at least 1 glass of water with each meal and aim for at least 8 glasses per day  Exercise at least 150 minutes every week.

## 2021-07-14 NOTE — Progress Notes (Signed)
Debra Miller is a 41 y.o. female who presents today for an office visit.  Assessment/Plan:  New/Acute Problems: Rash Likely candidal intertrigo.  We will start ketoconazole.  She can use cortisone cream if needed as well.  Discussed reasons to return to care.  Chronic Problems Addressed Today: Class 3 severe obesity without serious comorbidity with body mass index (BMI) of 45.0 to 49.9 in adult Debra Miller) She is down 30 pounds since bypass surgery 2 months ago.  We will stop Ozempic today due to side effects.  Continue metformin for now.  We will check labs.  She will continue working on diet and exercise.  Migraines Doing a lot better with Emgality.  We will need to avoid NSAIDs going forward due to her recent gastric bypass surgery.  We can follow-up in 3 to 6 months.  She can use Imitrex as needed.  S/P gastric bypass We will recheck labs.  She is doing well postoperatively.  Down about 30 pounds.  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  She would like to have suppressive therapy.  We will start Valtrex 1 g daily.  Check labs today.  Recent renal function was normal.  GERD (gastroesophageal reflux disease) No longer having issues after bypass.  We will stop omeprazole.  B12 deficiency B12 injection given today.  We will check B12 level.  Iron deficiency Taking supplement.  Check iron panel.  Anxiety Doing well with Celexa 40 mg daily.  She is also on buspar 5 mg twice daily which seems to be working well.  OSA (obstructive sleep apnea) Continue CPAP.   Essential hypertension At goal today.  She is down about 30 pounds since her bypass surgery.  She still has quite a bit of fluid retention.  We will continue current regimen of spironolactone 200 mg daily for now given her concurrent edema and PCOS.  We will follow-up in 3 months.  We will try to wean down on dose of spironolactone as tolerated.  PCOS (polycystic ovarian syndrome) We will  continue current dose of spironolactone 200 mg daily and metformin 1500 mg daily.  We can check labs today.  She will follow-up with me in 3 months.  Hopefully she continues to lose weight we will be able to wean off some of her medications.  Low vitamin D level Check vitamin D.  Paresthesias Her neurologic work-up thus far has been negative including imaging and nerve conduction studies.  She will follow-up with neurology soon.     Subjective:  HPI:  Patient here for follow-up.  Last seen 3 months ago.  She had gastric bypass surgery 2 months ago.  She had a bit of postoperative constipation and difficulty with a liquid diet but is now doing well.  She has lost about 30 pounds over the last couple of months.  She is no longer taking the omeprazole.  She has been compliant with her other medications.  She is concerned that the [place] may be causing her to have nausea.  She would like to have blood work done today.  Energy levels seem to be good.  She is very happy with her results thus far.  She also has longstanding issue with recurrent cold sores.  She will sometimes go several years without cold sores however the last several months she has had frequent outbreaks.  She has been having issues since she was a child.  She has developed a rash underneath her belly.  This comes and goes.  She has tried several over-the-counter products with no improvement.  See A/p for status of chronic conditions.        Objective:  Physical Exam: BP (!) 137/92    Pulse 66    Temp 98.4 F (36.9 C) (Temporal)    Ht 5' 3.5" (1.613 m)    Wt 252 lb 9.6 oz (114.6 kg)    LMP 06/29/2021 (Exact Date)    SpO2 97%    BMI 44.04 kg/m   Gen: No acute distress, resting comfortably CV: Regular rate and rhythm with no murmurs appreciated Pulm: Normal work of breathing, clear to auscultation bilaterally with no crackles, wheezes, or rhonchi Neuro: Grossly normal, moves all extremities Psych: Normal affect and thought  content  Time Spent: 45 minutes of total time was spent on the date of the encounter performing the following actions: chart review prior to seeing the patient including her recent surgery and specialist visits, obtaining history, performing a medically necessary exam, counseling on the treatment plan, placing orders, and documenting in our EHR.         Algis Greenhouse. [name], MD 07/14/2021 9:44 AM

## 2021-07-14 NOTE — Telephone Encounter (Signed)
Patient states she was just into the office to see Dr. [name].  States Dr. [name] wrote a script for a glucose monitor.    [facility] on [address] instructed patient to reach back out to our office stating the script was missing information.  States pharmacy is requesting call back with:  Name of glucose monitor How often patient needs to test

## 2021-07-14 NOTE — Assessment & Plan Note (Signed)
Doing a lot better with Emgality.  We will need to avoid NSAIDs going forward due to her recent gastric bypass surgery.  We can follow-up in 3 to 6 months.  She can use Imitrex as needed.

## 2021-07-14 NOTE — Assessment & Plan Note (Signed)
Discussed preventative versus as needed treatment.  She would like to have suppressive therapy.  We will start Valtrex 1 g daily.  Check labs today.  Recent renal function was normal.

## 2021-07-14 NOTE — Assessment & Plan Note (Signed)
Taking supplement.  Check iron panel.

## 2021-07-14 NOTE — Assessment & Plan Note (Signed)
At goal today.  She is down about 30 pounds since her bypass surgery.  She still has quite a bit of fluid retention.  We will continue current regimen of spironolactone 200 mg daily for now given her concurrent edema and PCOS.  We will follow-up in 3 months.  We will try to wean down on dose of spironolactone as tolerated.

## 2021-07-14 NOTE — Assessment & Plan Note (Signed)
Check vitamin D. 

## 2021-07-14 NOTE — Assessment & Plan Note (Signed)
We will recheck labs.  She is doing well postoperatively.  Down about 30 pounds.

## 2021-07-14 NOTE — Assessment & Plan Note (Signed)
Doing well with Celexa 40 mg daily.  She is also on buspar 5 mg twice daily which seems to be working well.

## 2021-07-14 NOTE — Assessment & Plan Note (Signed)
No longer having issues after bypass.  We will stop omeprazole.

## 2021-07-16 ENCOUNTER — Other Ambulatory Visit: Payer: Self-pay | Admitting: *Deleted

## 2021-07-16 MED ORDER — GLUCOSE BLOOD VI STRP: ORAL_STRIP | 12 refills | Status: DC

## 2021-07-19 ENCOUNTER — Other Ambulatory Visit: Payer: Self-pay | Admitting: *Deleted

## 2021-07-19 MED ORDER — ONETOUCH DELICA LANCETS 33G MISC: 1 refills | Status: DC

## 2021-07-19 MED ORDER — GLUCOSE BLOOD VI STRP: ORAL_STRIP | 12 refills | Status: DC

## 2021-07-19 NOTE — Progress Notes (Signed)
Please inform patient of the following:  We are still waiting on one of her levels to come back - can we check with lab regarding her estrogen level?  Her cholesterol levels are improving and her testosterone level is down a bit.   Her iron, B12 and vitamin D levels are good.  Do not need to make any changes to her treatment plan at this time. We can recheck in 3-6 months.

## 2021-07-20 ENCOUNTER — Other Ambulatory Visit: Payer: Self-pay

## 2021-07-20 ENCOUNTER — Encounter: Payer: BC Managed Care – PPO | Attending: Surgery | Admitting: Skilled Nursing Facility1

## 2021-07-20 DIAGNOSIS — E119 Type 2 diabetes mellitus without complications: Secondary | ICD-10-CM | POA: Insufficient documentation

## 2021-07-20 DIAGNOSIS — Z6841 Body Mass Index (BMI) 40.0 and over, adult: Secondary | ICD-10-CM | POA: Diagnosis present

## 2021-07-20 NOTE — Progress Notes (Signed)
Bariatric Nutrition Follow-Up Visit Medical Nutrition Therapy    NUTRITION ASSESSMENT   Surgery RYGB 05/24/2021  Anthropometrics  Start weight at [facility]: 283 lbs (date: 02/24/2021) Today's weight: 250.1 lbs  Body Composition Scale 07/20/2021  Weight  lbs 250.1  Total Body Fat  % 45.1     Visceral Fat 15  Fat-Free Mass  % 54.8     Total Body Water  % 41.9     Muscle-Mass  lbs 32.1  BMI 43.4  Body Fat Displacement ---        Torso  lbs 69.9        Left Leg  lbs 13.9        Right Leg  lbs 13.9        Left Arm  lbs 6.9        Right Arm  lbs 6.9   Clinical  Medical hx: Type 2 diabetes, hypercholesterolemia, HTN, asthma, sleep apnea, depression, GERD, anxiety, PCOS, Bipolar, asthma Medications: off heart burn medicine, off ozempic, still taking metformin   Labs: testosterone 46.71, vitamin D 31.01, iron saturation ratios 14.3, TIBC 469, B12 1550 (did take her multi that day), A1C 5.3, triglycerides 196, ALT 70 Notable signs/symptoms: migraines 4 a week if not taking shots, diarrhea/constipation daily, acid reflux, constant Bloat, nausea Any previous deficiencies? No   Lifestyle & Dietary Hx  Pt states she does not check her blood sugars.   Pt states her back legs and arms go numb sometimes stating this was occurring since before surgery pt states currently it has continued to be numb when before it would just come and go stating this Friday she has an appt with a  neurologist.  Pt states her migraines have not been as frequent since taking migraine shots.  Pt state she feels tired mostly due to constant numbness in her body and also sleep disturbances due to these issues.  Pt states she walks or journals to deal with stress.  Pt states she has had some bloat lately. Pt states she has had some chest pressure.   Estimated daily fluid intake: 80+ oz Estimated daily protein intake: 70+ g Supplements: celebrate multi and calcium Current average weekly physical activity: walking  thinking about getting a [employer] membership   24-Hr Dietary Recall First Meal: egg + ham Snack: protein drink + coffee  Second Meal: chicken or steak + ranch or hot sauce or A1 Snack: string cheese  Third Meal: chili or other meat or eggs Snack:  Beverages: water, lemon water, water + flavorings   Post-Op Goals/ Signs/ Symptoms Using straws: no Drinking while eating: no Chewing/swallowing difficulties: no Changes in vision: no Changes to mood/headaches: no Hair loss/changes to skin/nails: no Difficulty focusing/concentrating: no Sweating: no Limb weakness: no Dizziness/lightheadedness: no Palpitations: no  Carbonated/caffeinated beverages: no N/V/D/C/Gas: constipation  Abdominal pain: no Dumping syndrome: no    NUTRITION DIAGNOSIS  Overweight/obesity ([place]-3.3) related to past poor dietary habits and physical inactivity as evidenced by completed bariatric surgery and following dietary guidelines for continued weight loss and healthy nutrition status.     NUTRITION INTERVENTION Nutrition counseling (C-1) and education (E-2) to facilitate bariatric surgery goals, including: Diet advancement to the next phase (phase 4) now including non starchy vegetables The importance of consuming adequate calories as well as certain nutrients daily due to the body's need for essential vitamins, minerals, and fats The importance of daily physical activity and to reach a goal of at least 150 minutes of moderate to vigorous physical activity weekly (  or as directed by their physician) due to benefits such as increased musculature and improved lab values The importance of intuitive eating specifically learning hunger-satiety cues and understanding the importance of learning a new body: The importance of mindful eating to avoid grazing behaviors  Importance of vegetables To have an overall healthy diet, adult men and women are recommended to consume anywhere from 2-3 cups of vegetables daily. Vegetables  provide a wide range of vitamins and minerals such as vitamin A, vitamin C, potassium, and folic acid. According to the [employer] [facility], including fruit and vegetables daily may reduce the risk of cardiovascular disease, certain cancers, and other non-communicable diseases. When should you have a bowel movement?: Everyone has their own bowel regimen, some people have a few bowel movements a day whereas other may go every 2-3 days. According to the [employer], most people have 3 bowel movements a week and anything less can be alerting. When referring to the [facility] Stool Chart; type 1-2 can indicate constipation, 3-4 are ideal, and 5-7 may indicate diarrhea.  https://aboutconstipation.org/stool-form-guide.html, http://www.murphy-norris.com/.pdf  Fiber recommendations: Adults need anywhere from 20-30 grams of fiber daily to ensure adequate intake. Both kinds of fiber, insoluble and soluble, provide health benefits to the body. Insoluble fiber does not dissolve in water and thus will help move digested food through the GI tract, promoting regular bowel movements and preventing constipation. Soluble fiber does dissolve in water and will help lower blood glucose and cholesterol levels. An increased intake of fiber may decrease the risk for cardiovascular disease and will help people with type 2 diabetes control their blood glucose levels.  NationalDirectors.dk, [employer]oLenders.fr   Goals: -Continue to aim for a minimum of 64 fluid ounces 7 days a week with at least 30 ounces being plain water  -Eat non-starchy vegetables 2 times a day 7 days a week  -Start out with soft cooked vegetables today and tomorrow; if tolerated begin to eat raw vegetables or cooked including salads  -Eat your 3 ounces of  protein first then start in on your non-starchy vegetables; once you understand how much of your meal leads to satisfaction and not full while still eating 3 ounces of protein and non-starchy vegetables you can eat them in any order   -Continue to aim for 30 minutes of activity at least 5 times a week  -Do NOT cook with/add to your food: alfredo sauce, cheese sauce, barbeque sauce, ketchup, fat back, butter, bacon grease, grease, Crisco, OR SUGAR   Handouts Provided Include  Phase 4  Learning Style & Readiness for Change Teaching method utilized: Visual & Auditory  Demonstrated degree of understanding via: Teach Back  Readiness Level: action Barriers to learning/adherence to lifestyle change: none identified   RD's Notes for Next Visit Assess adherence to pt chosen goals   MONITORING & EVALUATION Dietary intake, weekly physical activity, body weight,  Next Steps Patient is to follow-up in 3-4 months

## 2021-07-22 LAB — ESTROGENS, TOTAL: Estrogen: 536.3 pg/mL

## 2021-07-22 NOTE — Progress Notes (Signed)
[facility] Clinic Note - Initial Visit   Date: 07/23/21  Debra Miller MRN: [id number] DOB: May 09, 1981   Dear Dr.Parker:  Thank you for your kind referral of Debra Miller for consultation of paresthesias. Although her history is well known to you, please allow [place] to reiterate it for the purpose of our medical record. The patient was accompanied to the clinic by husband who also provides collateral information.     History of Present Illness: Debra Miller is a 41 y.o. right-handed female with bipolar disorder, anxiety, hypertension, migraines, diabetes mellitus, and asthma presenting for evaluation of generalized numbness/tingling. For the past 10 years, she has numbness/tingling involving the legs, arms, hands, feet, back, chest, and neck. Symptoms are constant and worse in the right leg. It can move around from one limb to another.  She denies any specific triggers.  Her husband massages her legswhich used to provide some relief. She takes gabapentin 960m three times daily which provides relief and allows her to function.   The only time she that she did not feel her symptoms was when she had compression stockings for her gastric bypass surgery.  She has lost 50lb since November 2022.  She has anxiety and reports that her anxiety is well-controlled, but numbness/tingling is getting worse. [facility] brain, cervical spine, thoracic spine, lumbar spine and NCS/EMG of the legs, which has all been unremarkable.   She does not work. Nonsmoker, does not drink alcohol.   Out-side paper records, electronic medical record, and images have been reviewed where available and summarized as:  NCS/EMG of the legs 02/04/3020:  Normal  MRI thoracic spine 05/18/2021:  1. Minimal degenerative changes with no significant spinal canal or neural foraminal stenosis. 2. No cord signal abnormality.   MR LUMBAR SPINE  05/18/2021: 1. Normal appearance of the conus and cauda equina nerve roots. 2. Mild multilevel degenerative changes as above without significant spinal canal or neural foraminal stenosis. 3. Mild multilevel facet arthropathy, most advanced at L4-L5.   MRI cervical spine 09/22/2020: Small anterior left paracentral protrusion at the C6-7 level abutting the proximal esophagus.   No significant spinal canal or neural foraminal narrowing. No cord lesions.  MRI brain wo contrast 09/22/2020: Left cerebral subcortical white matter hyperintensities are nonspecific. Differential includes chronic migraines, post infectious/inflammatory sequela, early chronic microvascular ischemic changes and demyelination.    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   Depression    Diabetes mellitus without complication (HCC)    type II   Dizziness    Dysmenorrhea    Eczema    Elevated triglycerides with high cholesterol    Frequent headaches    GERD (gastroesophageal reflux disease)    H/O cold sores    High cholesterol    Hypertension    high blood pressure readings    Migraines    with aura   Obesity  PCOS (polycystic ovarian syndrome)    Seasonal allergies    Sleep apnea    Urine incontinence    UTI (urinary tract infection)     Past Surgical History:  Procedure Laterality Date   KNEE SURGERY Left 2018   RADIOLOGY WITH ANESTHESIA N/A 09/22/2020   Procedure: MRI WITH ANESTHESIA CERVICAL SPINE WITHOUT AND BRAIN WITHOUT;  Surgeon: Radiologist, Medication, MD;  Location:  [facility];  Service: Radiology;  Laterality: N/A;   RADIOLOGY WITH ANESTHESIA N/A 05/18/2021   Procedure: MRI WITH ANESTHESIA  THORACIC WITHOUT CONTRAST AND LUMBAR WITHOUT CONTRAST;  Surgeon: Radiologist, Medication, MD;  Location: [facility];  Service: Radiology;  Laterality: N/A;   TONSILLECTOMY AND ADENOIDECTOMY     TUBAL LIGATION  2008     Medications:  Outpatient Encounter Medications as of 07/23/2021  Medication Sig Note   albuterol (VENTOLIN HFA) 108 (90 Base) MCG/ACT inhaler TAKE 2 PUFFS BY MOUTH EVERY 6 HOURS AS NEEDED FOR WHEEZE OR SHORTNESS OF BREATH    blood glucose meter kit and supplies KIT Dispense based on patient and insurance preference. Use up to four times daily as directed.    busPIRone (BUSPAR) 10 MG tablet TAKE 1 TABLET BY MOUTH TWICE A DAY    citalopram (CELEXA) 40 MG tablet Take 1 tablet (40 mg total) by mouth daily. 05/19/2021: Filled 04/08/21. Prescribed by Dr. [name]   Cyanocobalamin (B-12 COMPLIANCE INJECTION IJ) Inject 1 Dose as directed every 30 (thirty) days.    FLOVENT HFA 110 MCG/ACT inhaler INHALE 2 PUFFS INTO THE LUNGS TWICE A DAY    gabapentin (NEURONTIN) 300 MG capsule TAKE 3 CAPSULES (900 MG TOTAL) BY MOUTH 3 (THREE) TIMES DAILY.    Galcanezumab-gnlm (EMGALITY) 120 MG/ML SOAJ Inject 29m monthly    glucose blood test strip Use as instructed E11.9    glucose blood test strip Use as instructed E11.9    ketoconazole (NIZORAL) 2 % cream Apply 1 application topically 2 (two) times daily.    metFORMIN (GLUCOPHAGE-XR) 750 MG 24 hr tablet TAKE 2 TABLETS (1,500 MG TOTAL) BY MOUTH DAILY WITH BREAKFAST.    montelukast (SINGULAIR) 10 MG tablet TAKE 1 TABLET BY MOUTH EVERYDAY AT BEDTIME    ondansetron (ZOFRAN-ODT) 4 MG disintegrating tablet Dissolve 1 tablet (4 mg total) by mouth every 6 (six) hours as needed for nausea or vomiting.    OneTouch Delica Lancets 329NMISC Use TID as needed for glucose check E11.9    Probiotic Product (PROBIOTIC PO) Take 1 capsule by mouth  daily.    spironolactone (ALDACTONE) 100 MG tablet TAKE 2 TABLETS BY MOUTH EVERY DAY    SUMAtriptan (IMITREX) 50 MG tablet TAKE 1 TABLET (50 MG TOTAL) BY MOUTH EVERY 2 (TWO) HOURS AS NEEDED FOR MIGRAINE. MAY REPEAT IN 2 HOURS IF HEADACHE PERSISTS OR RECURS    valACYclovir (VALTREX) 1000 MG tablet Take 1 tablet (1,000 mg total) by mouth daily.    Vitamin D, Ergocalciferol, (DRISDOL) 1.25 MG (50000 UNIT) CAPS capsule TAKE 1 CAPSULE (50,000 UNITS TOTAL) BY MOUTH EVERY 7 (SEVEN) DAYS    [DISCONTINUED] dicyclomine (BENTYL) 10 MG capsule TAKE 2 CAPSULES (20 MG TOTAL) BY MOUTH IN THE MORNING, AT NOON, IN THE EVENING, AND AT BEDTIME. (Patient not taking: Reported on 07/23/2021)    No facility-administered encounter medications on file as of 07/23/2021.    Allergies:  Allergies  Allergen Reactions   Ceclor [Cefaclor] Hives   Pediapred [Prednisolone Sodium Phosphate] Hives    Family History: Family History  Problem Relation Age  of Onset   Arthritis Mother    High Cholesterol Mother    Hypertension Mother    Diabetes Mother    Rheum arthritis Mother    Asthma Mother    Hyperlipidemia Mother    Migraines Mother    Thyroid disease Mother    Colon polyps Mother    Arthritis Maternal Grandmother    Breast cancer Maternal Grandmother    High Cholesterol Maternal Grandmother    Stroke Maternal Grandmother    Hypertension Maternal Grandmother    Migraines Maternal Grandmother    Colon polyps Sister    Colon polyps Maternal Aunt    Colon cancer Neg Hx    Esophageal cancer Neg Hx    Rectal cancer Neg Hx    Stomach cancer Neg Hx     Social History: Social History   Tobacco Use   Smoking status: Never   Smokeless tobacco: Never  Vaping Use   Vaping Use: Never used  Substance Use Topics   Alcohol use: No    Alcohol/week: 0.0 standard drinks   Drug use: No   Social History   Social History Narrative   Work or School: homemakerHome Situation: lives with husband and 5 kids (ages 6-14  in 2014)Spiritual Beliefs: ChristianLifestyle: no regular exercise, poor diet, lots of soda      Lives in a 3 story home    Right handed    Vital Signs:  BP (!) 139/93    Pulse 71    Ht 5' 3.5" (1.613 m)    Wt 247 lb (112 kg)    LMP 06/29/2021 (Exact Date)    SpO2 99%    BMI 43.07 kg/m    Neurological Exam: MENTAL STATUS including orientation to time, place, person, recent and remote memory, attention span and concentration, language, and fund of knowledge is normal.  Speech is not dysarthric.  CRANIAL NERVES: II:  No visual field defects.   III-IV-VI: Pupils equal round and reactive to light.  Normal conjugate, extra-ocular eye movements in all directions of gaze.  No nystagmus.  No ptosis.   V:  Normal facial sensation.    VII:  Normal facial symmetry and movements.   VIII:  Normal hearing and vestibular function.   IX-X:  Normal palatal movement.   XI:  Normal shoulder shrug and head rotation.   XII:  Normal tongue strength and range of motion, no deviation or fasciculation.  MOTOR:  No atrophy, fasciculations or abnormal movements.  No pronator drift.   Upper Extremity:  Right  Left  Deltoid  5/5   5/5   Biceps  5/5   5/5   Triceps  5/5   5/5   Infraspinatus 5/5  5/5  Medial pectoralis 5/5  5/5  Wrist extensors  5/5   5/5   Wrist flexors  5/5   5/5   Finger extensors  5/5   5/5   Finger flexors  5/5   5/5   Dorsal interossei  5/5   5/5   Abductor pollicis  5/5   5/5   Tone (Ashworth scale)  0  0   Lower Extremity:  Right  Left  Hip flexors  5/5   5/5   Hip extensors  5/5   5/5   Adductor 5/5  5/5  Abductor 5/5  5/5  Knee flexors  5/5   5/5   Knee extensors  5/5   5/5   Dorsiflexors  5/5   5/5   Plantarflexors  5/5  5/5   Toe extensors  5/5   5/5   Toe flexors  5/5   5/5   Tone (Ashworth scale)  0  0   MSRs:  Right        Left                  brachioradialis 2+  2+  biceps 2+  2+  triceps 2+  2+  patellar 2+  2+  ankle jerk 2+  2+  Hoffman no  no   plantar response down  down   SENSORY:  Normal and symmetric perception of light touch, vibration, and proprioception, except reduced pinprick in the lower legs and feet.  Romberg's sign absent.   COORDINATION/GAIT: Normal finger-to- nose-finger.  Intact rapid alternating movements bilaterally.  Gait narrow based and stable. Tandem and stressed gait intact.    IMPRESSION: Migratory paresthesias. Neurological testing has been extensive and personally reviewed.  MRI of the entire neuroaxis is essentially unremarkable. No evidence of demyelinating disease or compressive pathology. EMG bilateral legs from 2021 is also normal, specifically no evidence of neuropathy.  Her exam today is largely intact except for subject loss of pinprick in the lower legs. I tried to reassure patient that her neurological testing does not reveal primary nerve pathology as a cause of her symptoms. Stress/anxiety can certainly manifesting with these symptoms, which she reports is well-controlled.  I offered repeat NCS/EMG of the right side, which she will think about. My overall suspicion that this will result in meaningful answers is low.   Thank you for allowing me to participate in patient's care.  If I can answer any additional questions, I would be pleased to do so.    Sincerely,    Janeah Kovacich K. Posey Pronto, DO

## 2021-07-23 ENCOUNTER — Ambulatory Visit: Payer: BC Managed Care – PPO | Admitting: Neurology

## 2021-07-23 ENCOUNTER — Encounter: Payer: Self-pay | Admitting: Neurology

## 2021-07-23 ENCOUNTER — Other Ambulatory Visit: Payer: Self-pay

## 2021-07-23 VITALS — BP 139/93 | HR 71 | Ht 63.5 in | Wt 247.0 lb

## 2021-07-23 DIAGNOSIS — R202 Paresthesia of skin: Secondary | ICD-10-CM | POA: Diagnosis not present

## 2021-07-23 NOTE — Progress Notes (Signed)
Please inform patient of the following:  Estrogen level is normal.  [place] M. [name], MD 07/23/2021 12:03 PM

## 2021-08-10 ENCOUNTER — Other Ambulatory Visit: Payer: Self-pay | Admitting: Family Medicine

## 2021-08-19 ENCOUNTER — Other Ambulatory Visit: Payer: Self-pay | Admitting: Family Medicine

## 2021-08-26 ENCOUNTER — Other Ambulatory Visit: Payer: Self-pay | Admitting: Family Medicine

## 2021-08-31 ENCOUNTER — Encounter: Payer: BC Managed Care – PPO | Admitting: Neurology

## 2021-09-24 ENCOUNTER — Other Ambulatory Visit: Payer: Self-pay | Admitting: Family Medicine

## 2021-10-12 ENCOUNTER — Encounter: Payer: BC Managed Care – PPO | Attending: Surgery | Admitting: Skilled Nursing Facility1

## 2021-10-12 ENCOUNTER — Ambulatory Visit: Payer: BC Managed Care – PPO | Admitting: Family Medicine

## 2021-10-12 DIAGNOSIS — K219 Gastro-esophageal reflux disease without esophagitis: Secondary | ICD-10-CM | POA: Diagnosis not present

## 2021-10-12 DIAGNOSIS — Z6841 Body Mass Index (BMI) 40.0 and over, adult: Secondary | ICD-10-CM | POA: Diagnosis not present

## 2021-10-12 DIAGNOSIS — E785 Hyperlipidemia, unspecified: Secondary | ICD-10-CM | POA: Diagnosis not present

## 2021-10-12 DIAGNOSIS — Z713 Dietary counseling and surveillance: Secondary | ICD-10-CM | POA: Insufficient documentation

## 2021-10-12 DIAGNOSIS — I10 Essential (primary) hypertension: Secondary | ICD-10-CM | POA: Insufficient documentation

## 2021-10-13 NOTE — Progress Notes (Signed)
Follow-up visit:  Post-Operative 10/12/2021 Surgery ? ?Medical Nutrition Therapy:  Appt start time: 6:00pm end time:  7:00pm ? ?Primary concerns today: Post-operative Bariatric Surgery Nutrition Management 6 Month Post-Op Class ? ? ?Surgery RYGB 05/24/2021 ? ?Anthropometrics  ?Start weight at [facility]: 283 lbs (date: 02/24/2021) ?Today's weight: 223.3 lbs ? ?Body Composition Scale 07/20/2021 10/12/2021  ?Weight  lbs 250.1 223.3  ?Total Body Fat  % 45.1 42.4  ?   Visceral Fat 15 13  ?Fat-Free Mass  % 54.8 57.5  ?   Total Body Water  % 41.9 43.2  ?   Muscle-Mass  lbs 32.1 31.5  ?BMI 43.4 39.2  ?Body Fat Displacement ---   ?      Torso  lbs 69.9 58.7  ?      Left Leg  lbs 13.9 11.7  ?      Right Leg  lbs 13.9 11.7  ?      Left Arm  lbs 6.9 5.8  ?      Right Arm  lbs 6.9 5.8  ? ? ? ?Information Reviewed/ Discussed During Appointment: ?-Review of composition scale numbers ?-Fluid requirements (64-100 ounces) ?-Protein requirements (60-80g) ?-Strategies for tolerating diet ?-Advancement of diet to include Starchy vegetables ?-Barriers to inclusion of new foods ?-Inclusion of appropriate multivitamin and calcium supplements  ?-Exercise recommendations  ? ?Fluid intake: adequate  ? ?Medications: See List ?Supplementation: appropriate  ? ?CBG monitoring: every day ?Average CBG per patient: 120 to 150 ?Last patient reported A1c:  ? ?Using straws: no ?Drinking while eating: no ?Having you been chewing well: yes ?Chewing/swallowing difficulties: no ?Changes in vision: no ?Changes to mood/headaches: no ?Hair loss/Cahnges to skin/Changes to nails: no ?Any difficulty focusing or concentrating: no ?Sweating: no ?Dizziness/Lightheaded: no ?Palpitations: no  ?Carbonated beverages: no ?N/V/D/C/GAS: no ?Abdominal Pain: no ?Dumping syndrome: no ? ?Recent physical activity:  ADL's ? ?Progress Towards Goal(s):  In Progress ?Teaching method utilized: Visual & Auditory  ?Demonstrated degree of understanding via: Teach Back  ?Readiness Level:  Action ?Barriers to learning/adherence to lifestyle change: none identified ? ?Handouts given during visit include: ?Phase V diet Progression  ?Goals Sheet ?The Benefits of Exercise are endless..... ?Support Group Topics ? ? ? ?Teaching Method Utilized:  ?Visual ?Auditory ?Hands on ? ?Demonstrated degree of understanding via:  Teach Back  ? ?Monitoring/Evaluation:  Dietary intake, exercise, and body weight. Follow up in 3 months for 9 month post-op visit.  ?

## 2021-11-20 ENCOUNTER — Other Ambulatory Visit: Payer: Self-pay | Admitting: Family Medicine

## 2021-11-24 ENCOUNTER — Other Ambulatory Visit: Payer: Self-pay | Admitting: Family Medicine

## 2022-01-03 ENCOUNTER — Encounter: Payer: Self-pay | Admitting: Family Medicine

## 2022-01-05 NOTE — Telephone Encounter (Signed)
Ok to send in flagyl '500mg'$  bid x 7 days but she needs visit if still not improving.  Algis Greenhouse. [name], MD 01/05/2022 11:50 AM

## 2022-01-06 ENCOUNTER — Other Ambulatory Visit: Payer: Self-pay | Admitting: *Deleted

## 2022-01-06 MED ORDER — METRONIDAZOLE 500 MG PO TABS: 500.0000 mg | ORAL_TABLET | Freq: Two times a day (BID) | ORAL | 0 refills | Status: DC

## 2022-01-10 ENCOUNTER — Ambulatory Visit: Payer: BC Managed Care – PPO | Admitting: Family Medicine

## 2022-01-10 ENCOUNTER — Encounter: Payer: Self-pay | Admitting: Family Medicine

## 2022-01-10 ENCOUNTER — Other Ambulatory Visit (HOSPITAL_COMMUNITY)
Admission: RE | Admit: 2022-01-10 | Discharge: 2022-01-10 | Disposition: A | Discharge status: Home / Self Care | Payer: BC Managed Care – PPO | Source: Ambulatory Visit | Attending: Family Medicine | Admitting: Family Medicine

## 2022-01-10 VITALS — BP 122/74 | HR 70 | Temp 98.2°F | Ht 63.0 in | Wt 211.4 lb

## 2022-01-10 DIAGNOSIS — I10 Essential (primary) hypertension: Secondary | ICD-10-CM

## 2022-01-10 DIAGNOSIS — J309 Allergic rhinitis, unspecified: Secondary | ICD-10-CM | POA: Insufficient documentation

## 2022-01-10 DIAGNOSIS — E282 Polycystic ovarian syndrome: Secondary | ICD-10-CM | POA: Diagnosis not present

## 2022-01-10 DIAGNOSIS — E559 Vitamin D deficiency, unspecified: Secondary | ICD-10-CM

## 2022-01-10 DIAGNOSIS — E538 Deficiency of other specified B group vitamins: Secondary | ICD-10-CM

## 2022-01-10 DIAGNOSIS — N760 Acute vaginitis: Secondary | ICD-10-CM | POA: Insufficient documentation

## 2022-01-10 DIAGNOSIS — E611 Iron deficiency: Secondary | ICD-10-CM | POA: Diagnosis not present

## 2022-01-10 MED ORDER — AZELASTINE HCL 0.1 % NA SOLN: 2.0000 | Freq: Two times a day (BID) | NASAL | 12 refills | Status: DC

## 2022-01-10 NOTE — Assessment & Plan Note (Signed)
She is on Singulair.  We will add on Astelin to see if this helps.  This continues to be an issue will need referral to ENT.

## 2022-01-10 NOTE — Progress Notes (Signed)
   Debra Miller is a 41 y.o. female who presents today for an office visit.  Assessment/Plan:  New/Acute Problems: Acute vaginitis Was recently started on empiric Flagyl for presumed BV.  We will check swab today to confirm.  She has noticed improvement since starting her Flagyl.  Chronic Problems Addressed Today: Allergic rhinitis She is on Singulair.  We will add on Astelin to see if this helps.  This continues to be an issue will need referral to ENT.  B12 deficiency Check B12.  Iron deficiency Check iron panel.  PCOS (polycystic ovarian syndrome) Still has worsening PCOS symptoms despite weight loss.  We will stop her metformin as it does not seem to be giving much benefit at this point.  We will check her labs today.  She will continue spironolactone 200 mg daily.  May consider referral to endocrinology if this continues to be an issue.  Essential hypertension At goal today on spironolactone 200 mg daily.  She has lost quite a bit of weight over the last several months.  We will continue current regimen for now however may build to wean down the spironolactone soon.     Subjective:  HPI:  Patient is here for follow-up.  She was last seen about 6 months ago.  She was doing reasonably well at that time.  She has continued to lose weight since her bypass surgery late last year.  She is down about 85 pounds over the last 8 months.  She feels like her hormones are balanced.  Since her bypass surgery she has had worsening acne and body hair growth.  She is also noticed worsening myalgias and joint pains.  She also feels tired all the time.  She feels like the more weight she loses worse her symptoms are getting.  She is not sleeping well.  She has been compliant with her CPAP.  She has been compliant with her vitamins.  She also has had an episode of BV started about a week ago.  She was started on empiric Flagyl.  Her symptoms seem to be improving.  No reported fevers or  chills.        Objective:  Physical Exam: BP 122/74   Pulse 70   Temp 98.2 F (36.8 C) (Temporal)   Ht '5\' 3"'$  (1.6 m)   Wt 211 lb 6.4 oz (95.9 kg)   LMP 01/04/2022   SpO2 97%   BMI 37.45 kg/m   Wt Readings from Last 3 Encounters:  01/10/22 211 lb 6.4 oz (95.9 kg)  10/13/21 223 lb 4.8 oz (101.3 kg)  07/23/21 247 lb (112 kg)    Gen: No acute distress, resting comfortably CV: Regular rate and rhythm with no murmurs appreciated Pulm: Normal work of breathing, clear to auscultation bilaterally with no crackles, wheezes, or rhonchi Neuro: Grossly normal, moves all extremities Psych: Normal affect and thought content      Marney Treloar M. [name], MD 01/10/2022 3:46 PM

## 2022-01-10 NOTE — Assessment & Plan Note (Signed)
Still has worsening PCOS symptoms despite weight loss.  We will stop her metformin as it does not seem to be giving much benefit at this point.  We will check her labs today.  She will continue spironolactone 200 mg daily.  May consider referral to endocrinology if this continues to be an issue.

## 2022-01-10 NOTE — Assessment & Plan Note (Signed)
Check B12 

## 2022-01-10 NOTE — Assessment & Plan Note (Signed)
At goal today on spironolactone 200 mg daily.  She has lost quite a bit of weight over the last several months.  We will continue current regimen for now however may build to wean down the spironolactone soon.

## 2022-01-10 NOTE — Patient Instructions (Signed)
It was very nice to see you today!  We will check blood work.  We may need to send you a gynecologist depending on results.  Please try the Astelin nasal spray.  Take care, Dr [name]  PLEASE NOTE:  If you had any lab tests please let us know if you have not heard back within a few days. You may see your results on mychart before we have a chance to review them but we will give you a call once they are reviewed by [place]. If we ordered any referrals today, please let us know if you have not heard from their office within the next week.   Please try these tips to maintain a healthy lifestyle:  Eat at least 3 REAL meals and 1-2 snacks per day.  Aim for no more than 5 hours between eating.  If you eat breakfast, please do so within one hour of getting up.   Each meal should contain half fruits/vegetables, one quarter protein, and one quarter carbs (no bigger than a computer mouse)  Cut down on sweet beverages. This includes juice, soda, and sweet tea.   Drink at least 1 glass of water with each meal and aim for at least 8 glasses per day  Exercise at least 150 minutes every week.

## 2022-01-10 NOTE — Assessment & Plan Note (Signed)
Check iron panel. 

## 2022-01-11 ENCOUNTER — Encounter: Payer: BC Managed Care – PPO | Attending: Surgery | Admitting: Skilled Nursing Facility1

## 2022-01-11 ENCOUNTER — Encounter: Payer: Self-pay | Admitting: Skilled Nursing Facility1

## 2022-01-11 DIAGNOSIS — G4733 Obstructive sleep apnea (adult) (pediatric): Secondary | ICD-10-CM | POA: Insufficient documentation

## 2022-01-11 DIAGNOSIS — E785 Hyperlipidemia, unspecified: Secondary | ICD-10-CM | POA: Diagnosis not present

## 2022-01-11 DIAGNOSIS — E282 Polycystic ovarian syndrome: Secondary | ICD-10-CM | POA: Insufficient documentation

## 2022-01-11 DIAGNOSIS — I10 Essential (primary) hypertension: Secondary | ICD-10-CM | POA: Diagnosis not present

## 2022-01-11 DIAGNOSIS — Z713 Dietary counseling and surveillance: Secondary | ICD-10-CM | POA: Diagnosis not present

## 2022-01-11 DIAGNOSIS — E669 Obesity, unspecified: Secondary | ICD-10-CM | POA: Diagnosis present

## 2022-01-11 DIAGNOSIS — E1169 Type 2 diabetes mellitus with other specified complication: Secondary | ICD-10-CM | POA: Diagnosis not present

## 2022-01-11 LAB — HEMOGLOBIN A1C: Hgb A1c MFr Bld: 5.1 % (ref 4.6–6.5)

## 2022-01-11 LAB — COMPREHENSIVE METABOLIC PANEL
ALT: 20 U/L (ref 0–35)
AST: 17 U/L (ref 0–37)
Albumin: 4.7 g/dL (ref 3.5–5.2)
Alkaline Phosphatase: 75 U/L (ref 39–117)
BUN: 22 mg/dL (ref 6–23)
CO2: 27 mEq/L (ref 19–32)
Calcium: 9.8 mg/dL (ref 8.4–10.5)
Chloride: 108 mEq/L (ref 96–112)
Creatinine, Ser: 0.69 mg/dL (ref 0.40–1.20)
GFR: 109.16 mL/min (ref >60.00)
Glucose, Bld: 83 mg/dL (ref 70–99)
Potassium: 4.7 mEq/L (ref 3.5–5.1)
Sodium: 145 mEq/L (ref 135–145)
Total Bilirubin: 0.4 mg/dL (ref 0.2–1.2)
Total Protein: 7.1 g/dL (ref 6.0–8.3)

## 2022-01-11 LAB — URINALYSIS, ROUTINE W REFLEX MICROSCOPIC
Bilirubin Urine: NEGATIVE
Hgb urine dipstick: NEGATIVE
Ketones, ur: NEGATIVE
Leukocytes,Ua: NEGATIVE
Nitrite: NEGATIVE
RBC / HPF: NONE SEEN (ref 0–?)
Specific Gravity, Urine: <=1.005 — AB (ref 1.000–1.030)
Total Protein, Urine: NEGATIVE
Urine Glucose: NEGATIVE
Urobilinogen, UA: 0.2 (ref 0.0–1.0)
pH: 6 (ref 5.0–8.0)

## 2022-01-11 LAB — CBC
HCT: 40.4 % (ref 36.0–46.0)
Hemoglobin: 13.6 g/dL (ref 12.0–15.0)
MCHC: 33.7 g/dL (ref 30.0–36.0)
MCV: 89.8 fl (ref 78.0–100.0)
Platelets: 215 10*3/uL (ref 150.0–400.0)
RBC: 4.5 Mil/uL (ref 3.87–5.11)
RDW: 12.9 % (ref 11.5–15.5)
WBC: 5.7 10*3/uL (ref 4.0–10.5)

## 2022-01-11 LAB — IBC + FERRITIN
Ferritin: 8.9 ng/mL — ABNORMAL LOW (ref 10.0–291.0)
Iron: 73 ug/dL (ref 42–145)
Saturation Ratios: 13.4 % — ABNORMAL LOW (ref 20.0–50.0)
TIBC: 546 ug/dL — ABNORMAL HIGH (ref 250.0–450.0)
Transferrin: 390 mg/dL — ABNORMAL HIGH (ref 212.0–360.0)

## 2022-01-11 LAB — TESTOSTERONE: Testosterone: 33.14 ng/dL (ref 15.00–40.00)

## 2022-01-11 LAB — LIPID PANEL
Cholesterol: 172 mg/dL (ref 0–200)
HDL: 37.1 mg/dL — ABNORMAL LOW (ref >39.00)
LDL Cholesterol: 96 mg/dL (ref 0–99)
NonHDL: 134.6
Total CHOL/HDL Ratio: 5
Triglycerides: 195 mg/dL — ABNORMAL HIGH (ref 0.0–149.0)
VLDL: 39 mg/dL (ref 0.0–40.0)

## 2022-01-11 LAB — TSH: TSH: 1.1 u[IU]/mL (ref 0.35–5.50)

## 2022-01-11 LAB — VITAMIN D 25 HYDROXY (VIT D DEFICIENCY, FRACTURES): VITD: 24.64 ng/mL — ABNORMAL LOW (ref 30.00–100.00)

## 2022-01-11 NOTE — Telephone Encounter (Signed)
Please advise 

## 2022-01-11 NOTE — Progress Notes (Signed)
Follow-up visit:  Post-Operative 10/12/2021 Surgery  Surgery RYGB 05/24/2021  Anthropometrics  Start weight at [facility]: 283 lbs (date: 02/24/2021) Today's weight: 210.3 lbs  Body Composition Scale 07/20/2021 10/12/2021 01/11/2022  Weight  lbs 250.1 223.3 210.8  Total Body Fat  % 45.1 42.4 40.8     Visceral Fat '15 13 12  '$ Fat-Free Mass  % 54.8 57.5 59.1     Total Body Water  % 41.9 43.2 44     Muscle-Mass  lbs 32.1 31.5 31.4  BMI 43.4 39.2 36.9  Body Fat Displacement ---          Torso  lbs 69.9 58.7 53.3        Left Leg  lbs 13.9 11.7 10.6        Right Leg  lbs 13.9 11.7 10.6        Left Arm  lbs 6.9 5.8 5.3        Right Arm  lbs 6.9 5.8 5.3    Pt states she has been tired and fatigued stating her testosterone. Pt states she has been having anger spells and hair growing in places   Pt states she has not been active due to having no energy.   Pt states all the diet and surgery stuff has ben easy but these symptoms have been terrible.  24 hr recall:  Breakfast: eggs + chicken sausage or yogurt Snack: Lunch: [place] wrap with tortilla + mustard Snack:  Dinner: hamburger patty + mustard + cauliflower rice  Beverages: water, lemon water   Fluid intake: adequate   Medications: See List Supplementation: appropriate   CBG monitoring: every day Average CBG per patient: 102-150 something Last patient reported A1c:   Using straws: no Drinking while eating: no Having you been chewing well: yes Chewing/swallowing difficulties: no Changes in vision: no Changes to mood/headaches: no Hair loss/Cahnges to skin/Changes to nails: no Any difficulty focusing or concentrating: no Sweating: no Dizziness/Lightheaded: no Palpitations: no  Carbonated beverages: no N/V/D/C/GAS: no Abdominal Pain: no Dumping syndrome: no  Recent physical activity:  ADL's  Progress Towards Goal(s):  In Progress Teaching method utilized: Environmental health practitioner & Auditory  Demonstrated degree of understanding via:  Teach Back  Readiness Level: Action Barriers to learning/adherence to lifestyle change: none identified   Teaching Method Utilized:  Visual Auditory Hands on  Demonstrated degree of understanding via:  Teach Back   Monitoring/Evaluation:  Dietary intake, exercise, and body weight.  Return in 6 weeks

## 2022-01-12 LAB — CERVICOVAGINAL ANCILLARY ONLY
Bacterial Vaginitis (gardnerella): NEGATIVE
Candida Glabrata: NEGATIVE
Candida Vaginitis: NEGATIVE
Chlamydia: NEGATIVE
Comment: NEGATIVE
Comment: NEGATIVE
Comment: NEGATIVE
Comment: NEGATIVE
Comment: NEGATIVE
Comment: NORMAL
Neisseria Gonorrhea: NEGATIVE
Trichomonas: NEGATIVE

## 2022-01-12 NOTE — Telephone Encounter (Signed)
[facility] with referral.  Algis Greenhouse. [name], MD 01/12/2022 11:50 AM

## 2022-01-15 ENCOUNTER — Other Ambulatory Visit: Payer: Self-pay | Admitting: Family Medicine

## 2022-01-15 ENCOUNTER — Other Ambulatory Visit: Payer: Self-pay | Admitting: Pulmonary Disease

## 2022-01-15 LAB — ESTROGENS, TOTAL: Estrogen: 189.1 pg/mL

## 2022-01-16 ENCOUNTER — Other Ambulatory Visit: Payer: Self-pay | Admitting: Family Medicine

## 2022-01-17 ENCOUNTER — Other Ambulatory Visit: Payer: Self-pay | Admitting: *Deleted

## 2022-01-17 DIAGNOSIS — E538 Deficiency of other specified B group vitamins: Secondary | ICD-10-CM

## 2022-01-17 NOTE — Progress Notes (Signed)
Please inform patient of the following:  Cholesterol levels are stable. Vitamin D is low.  Recommend lose 4000 5000 IUs daily and we can recheck in 6 months. Iron counts are low.  This could explain some of her symptoms also.  Please make sure that she is taking iron supplementation ferrous sulfate 65 mg every other day with vitamin C.  The rest of her labs including hormone levels are normal.  CAn we see if we can add on a B12? If not I would like for her to come back to have this done if she is okay with this.  Algis Greenhouse. [name], MD 01/17/2022 12:45 PM

## 2022-01-17 NOTE — Telephone Encounter (Signed)
Referral placed.

## 2022-01-18 ENCOUNTER — Ambulatory Visit: Payer: BC Managed Care – PPO | Admitting: Family Medicine

## 2022-01-18 ENCOUNTER — Other Ambulatory Visit: Payer: BC Managed Care – PPO

## 2022-01-20 ENCOUNTER — Other Ambulatory Visit (INDEPENDENT_AMBULATORY_CARE_PROVIDER_SITE_OTHER): Payer: BC Managed Care – PPO

## 2022-01-20 DIAGNOSIS — E538 Deficiency of other specified B group vitamins: Secondary | ICD-10-CM | POA: Diagnosis not present

## 2022-01-20 LAB — VITAMIN B12: Vitamin B-12: 335 pg/mL (ref 211–911)

## 2022-01-21 NOTE — Progress Notes (Signed)
Please inform patient of the following:  B12 level is normal.  Yasheka Fossett M. [name], MD 01/21/2022 1:13 PM

## 2022-02-05 NOTE — Telephone Encounter (Signed)
Send message to [facility] Referral coordinator for referral information

## 2022-02-25 ENCOUNTER — Other Ambulatory Visit (HOSPITAL_COMMUNITY): Payer: Self-pay

## 2022-03-22 ENCOUNTER — Encounter: Payer: BC Managed Care – PPO | Attending: Surgery | Admitting: Skilled Nursing Facility1

## 2022-03-22 ENCOUNTER — Encounter: Payer: Self-pay | Admitting: Skilled Nursing Facility1

## 2022-03-22 DIAGNOSIS — E669 Obesity, unspecified: Secondary | ICD-10-CM | POA: Insufficient documentation

## 2022-03-22 NOTE — Progress Notes (Signed)
Follow-up visit:  Post-Operative 10/12/2021 Surgery  Surgery R[facility]GB 05/24/2021  Anthropometrics  Start weight at [facility]: 283 lbs (date: 02/24/2021) Today's weight: 210.3 lbs  Body Composition Scale 10/12/2021 01/11/2022 03/22/2022  Weight  lbs 223.3 210.8 210.3  Total Body Fat  % 42.4 40.8 40.5     Visceral Fat '13 12 11  '$ Fat-Free Mass  % 57.5 59.1 59.4     Total Body Water  % 43.2 44 44.2     Muscle-Mass  lbs 31.5 31.4 31.7  BMI 39.2 36.9 36.4  Body Fat Displacement           Torso  lbs 58.7 53.3 52.7        Left Leg  lbs 11.7 10.6 10.5        Right Leg  lbs 11.7 10.6 10.5        Left Arm  lbs 5.8 5.3 5.2        Right Arm  lbs 5.8 5.3 5.2    Pt states she is feeling really low energy and her whole body hurts stating it is a muscle ache feeling as if someone is beating her up every day.  Pt states she struggles with constipation.  Pt states she does not sleep well at night and sleeps throughout the day. Pt states most of the days she is laying down most of the day.  Pt states her doctor referred her to a endocrinologist but has not seen them yet.  Pt states her stomach will randomly cramp throughout the week upon further questions she is not sure if it is coming from her uterus or her stomach only happening right before her menstrual cycle is to start.  24 hr recall: Breakfast: skipped Snack: Lunch:protein shake + coffee Snack: grapes and banana and pretzel Dinner 4pm: [place] sandwich  Beverages: water, lemon water, juice, coffee   Fluid intake: adequate   Medications: See List Supplementation: multi and calcium  CBG monitoring: every day Average CBG per patient: 102-150 something Last patient reported A1c:   Using straws: no Drinking while eating: no Having you been chewing well: yes Chewing/swallowing difficulties: no Changes in vision: no Changes to mood/headaches: no Hair loss/Cahnges to skin/Changes to nails: no Any difficulty focusing or concentrating:  no Sweating: no Dizziness/Lightheaded: no Palpitations: no  Carbonated beverages: no N/V/D/C/GAS: no Abdominal Pain: no Dumping syndrome: no  Recent physical activity:  ADL's  Progress Towards Goal(s):  In Progress Teaching method utilized: Environmental health practitioner & Auditory  Demonstrated degree of understanding via: Teach Back  Readiness Level: Action Barriers to learning/adherence to lifestyle change: none identified   Teaching Method Utilized:  Visual Auditory Hands on  Demonstrated degree of understanding via:  Teach Back   Monitoring/Evaluation:  Dietary intake, exercise, and body weight.  Return when you have had your doctor visits.

## 2022-03-28 ENCOUNTER — Encounter: Payer: Self-pay | Admitting: *Deleted

## 2022-06-02 ENCOUNTER — Other Ambulatory Visit: Payer: Self-pay | Admitting: Family Medicine

## 2022-06-16 ENCOUNTER — Encounter: Payer: Self-pay | Admitting: *Deleted

## 2022-07-13 ENCOUNTER — Other Ambulatory Visit (HOSPITAL_COMMUNITY): Payer: Self-pay

## 2022-08-19 ENCOUNTER — Encounter: Payer: Self-pay | Admitting: Family

## 2022-08-19 ENCOUNTER — Ambulatory Visit (INDEPENDENT_AMBULATORY_CARE_PROVIDER_SITE_OTHER): Payer: BC Managed Care – PPO | Admitting: Family

## 2022-08-19 VITALS — BP 123/84 | HR 70 | Temp 97.0°F | Ht 63.5 in | Wt 206.4 lb

## 2022-08-19 DIAGNOSIS — K0889 Other specified disorders of teeth and supporting structures: Secondary | ICD-10-CM | POA: Diagnosis not present

## 2022-08-19 DIAGNOSIS — S0993XA Unspecified injury of face, initial encounter: Secondary | ICD-10-CM

## 2022-08-19 MED ORDER — HYDROCODONE-ACETAMINOPHEN 5-325 MG PO TABS: 1.0000 | ORAL_TABLET | Freq: Four times a day (QID) | ORAL | 0 refills | Status: AC | PRN

## 2022-08-19 NOTE — Progress Notes (Signed)
Patient ID: Debra Miller, female    DOB: 12/10/1980, 42 y.o.   MRN: [id number]  Chief Complaint  Patient presents with   Dental Pain    Pt c/o dental pain on right side for 4 days. Surgery scheduled next week.     HPI:      Dental pain:  has a broken tooth bottom rear molar, due for root canal, had to be rescheduled, seen by dentist, hoping for next week. States they gave her antibiotic but told her to take NSAIDs for pain but she has had gastric bypass and this is contraindicated.      Assessment & Plan:  1. Pain due to dental trauma sending hydrocodone 52m-325mg, advised on use & SE, controlled substance, do not drive while taking, use with extra strength tylenol or alternate dosing.  - HYDROcodone-acetaminophen (NORCO/VICODIN) 5-325 MG tablet; Take 1 tablet by mouth every 6 (six) hours as needed for up to 5 days for moderate pain or severe pain.  Dispense: 15 tablet; Refill: 0  Subjective:    Outpatient Medications Prior to Visit  Medication Sig Dispense Refill   albuterol (VENTOLIN HFA) 108 (90 Base) MCG/ACT inhaler TAKE 2 PUFFS BY MOUTH EVERY 6 HOURS AS NEEDED FOR WHEEZE OR SHORTNESS OF BREATH 20.1 each 1   azelastine (ASTELIN) 0.1 % nasal spray Place 2 sprays into both nostrils 2 (two) times daily. 30 mL 12   blood glucose meter kit and supplies KIT Dispense based on patient and insurance preference. Use up to four times daily as directed. 1 each 0   busPIRone (BUSPAR) 10 MG tablet TAKE 1 TABLET BY MOUTH TWICE A DAY 180 tablet 1   citalopram (CELEXA) 40 MG tablet TAKE 1 TABLET BY MOUTH EVERY DAY 90 tablet 3   FLOVENT HFA 110 MCG/ACT inhaler INHALE 2 PUFFS INTO THE LUNGS TWICE A DAY 36 each 3   Galcanezumab-gnlm (EMGALITY) 120 MG/ML SOAJ Inject 142mmonthly 4 mL 3   glucose blood test strip Use as instructed E11.9 100 each 12   glucose blood test strip Use as instructed E11.9 100 each 12   ketoconazole (NIZORAL) 2 % cream APPLY TO AFFECTED AREA TWICE A DAY 60 g 0   montelukast  (SINGULAIR) 10 MG tablet TAKE 1 TABLET BY MOUTH EVERYDAY AT BEDTIME 90 tablet 1   OneTouch Delica Lancets [id number] Use TID as needed for glucose check E11.9 100 each 1   spironolactone (ALDACTONE) 100 MG tablet TAKE 2 TABLETS BY MOUTH EVERY DAY 180 tablet 3   SUMAtriptan (IMITREX) 50 MG tablet TAKE 1 TABLET (50 MG TOTAL) BY MOUTH EVERY 2 (TWO) HOURS AS NEEDED FOR MIGRAINE. MAY REPEAT IN 2 HOURS IF HEADACHE PERSISTS OR RECURS 10 tablet 0   valACYclovir (VALTREX) 1000 MG tablet Take 1 tablet (1,000 mg total) by mouth daily. 90 tablet 3   Vitamin D, Ergocalciferol, (DRISDOL) 1.25 MG (50000 UNIT) CAPS capsule TAKE 1 CAPSULE (50,000 UNITS TOTAL) BY MOUTH EVERY 7 (SEVEN) DAYS 12 capsule 0   Probiotic Product (PROBIOTIC PO) Take 1 capsule by mouth daily.     citalopram (CELEXA) 20 MG tablet TAKE 1 TABLET BY MOUTH EVERY DAY (Patient not taking: Reported on 08/19/2022) 90 tablet 2   gabapentin (NEURONTIN) 300 MG capsule TAKE 3 CAPSULES (900 MG TOTAL) BY MOUTH 3 (THREE) TIMES DAILY. (Patient not taking: Reported on 08/19/2022) 810 capsule 3   No facility-administered medications prior to visit.   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   Depression    Diabetes mellitus without complication (HCC)    type II   Dizziness    Dysmenorrhea    Eczema    Elevated triglycerides with high cholesterol    Frequent headaches    GERD (gastroesophageal reflux disease)    H/O cold sores    High cholesterol    Hypertension    high blood pressure readings    Migraines    with aura   Obesity    PCOS (polycystic ovarian syndrome)    Seasonal allergies    Sleep apnea    Urine incontinence    UTI (urinary tract infection)    Past Surgical History:  Procedure Laterality Date   KNEE SURGERY Left 2018   RADIOLOGY WITH ANESTHESIA N/A 09/22/2020    Procedure: MRI WITH ANESTHESIA CERVICAL SPINE WITHOUT AND BRAIN WITHOUT;  Surgeon: Radiologist, Medication, MD;  Location: [facility];  Service: Radiology;  Laterality: N/A;   RADIOLOGY WITH ANESTHESIA N/A 05/18/2021   Procedure: MRI WITH ANESTHESIA  THORACIC WITHOUT CONTRAST AND LUMBAR WITHOUT CONTRAST;  Surgeon: Radiologist, Medication, MD;  Location: [facility];  Service: Radiology;  Laterality: N/A;   TONSILLECTOMY AND ADENOIDECTOMY     TUBAL LIGATION  2008   Allergies  Allergen Reactions   Ceclor [Cefaclor] Hives   Pediapred [Prednisolone Sodium Phosphate] Hives      Objective:    Physical Exam Vitals and nursing note reviewed.  Constitutional:      Appearance: Normal appearance.  HENT:     Mouth/Throat:     Dentition: Abnormal dentition (broken left lower molar).     Pharynx: No pharyngeal swelling, oropharyngeal exudate, posterior oropharyngeal erythema or uvula swelling.  Cardiovascular:     Rate and Rhythm: Normal rate and regular rhythm.  Pulmonary:     Effort: Pulmonary effort is normal.     Breath sounds: Normal breath sounds.  Musculoskeletal:        General: Normal range of motion.  Skin:    General: Skin is warm and dry.  Neurological:     Mental Status: She is alert.  Psychiatric:        Mood and Affect: Mood normal.        Behavior: Behavior normal.    BP 123/84 (BP Location: Left Arm, Patient Position: Sitting, Cuff Size: Large)   Pulse 70   Temp (!) 97 F (36.1 C) (Temporal)   Ht 5' 3.5" (1.613 m)   Wt 206 lb 6 oz (93.6 kg)   SpO2 98%   BMI 35.98 kg/m  Wt Readings from Last 3 Encounters:  08/19/22 206 lb 6 oz (93.6 kg)  03/22/22 210 lb 4.8 oz (95.4 kg)  01/11/22 210 lb 12.8 oz (95.6 kg)       Jeanie Sewer, NP

## 2022-08-28 ENCOUNTER — Other Ambulatory Visit: Payer: Self-pay | Admitting: Family Medicine

## 2022-10-01 ENCOUNTER — Other Ambulatory Visit: Payer: Self-pay | Admitting: Family Medicine

## 2023-01-08 DIAGNOSIS — W109XXA Fall (on) (from) unspecified stairs and steps, initial encounter: Secondary | ICD-10-CM | POA: Diagnosis not present

## 2023-01-08 DIAGNOSIS — I10 Essential (primary) hypertension: Secondary | ICD-10-CM | POA: Diagnosis not present

## 2023-01-08 DIAGNOSIS — S93402A Sprain of unspecified ligament of left ankle, initial encounter: Secondary | ICD-10-CM | POA: Diagnosis not present

## 2023-01-08 DIAGNOSIS — S99912A Unspecified injury of left ankle, initial encounter: Secondary | ICD-10-CM | POA: Diagnosis present

## 2023-01-09 ENCOUNTER — Emergency Department (HOSPITAL_BASED_OUTPATIENT_CLINIC_OR_DEPARTMENT_OTHER): Payer: BC Managed Care – PPO

## 2023-01-09 ENCOUNTER — Emergency Department (HOSPITAL_BASED_OUTPATIENT_CLINIC_OR_DEPARTMENT_OTHER)
Admission: EM | Admit: 2023-01-09 | Discharge: 2023-01-09 | Disposition: A | Discharge status: Home / Self Care | Payer: BC Managed Care – PPO | Attending: Emergency Medicine | Admitting: Emergency Medicine

## 2023-01-09 ENCOUNTER — Encounter (HOSPITAL_BASED_OUTPATIENT_CLINIC_OR_DEPARTMENT_OTHER): Payer: Self-pay

## 2023-01-09 DIAGNOSIS — S93402A Sprain of unspecified ligament of left ankle, initial encounter: Secondary | ICD-10-CM

## 2023-01-09 NOTE — ED Provider Notes (Signed)
[place] EMERGENCY DEPARTMENT AT [facility] Provider Note   CSN: [id number] Arrival date & time: 01/08/23  2355     History  Chief Complaint  Patient presents with   Foot Injury    Debra Miller is a 42 y.o. female.  Patient is a 42 year old female with history of migraines, bipolar, hypertension.  Patient presenting today with complaints of left ankle/foot injury.  She reports walking out of the house and turning her ankle.  She has pain and swelling to the ankle and top of the foot.  She has pain with bearing weight.  No alleviating factors.  The history is provided by the patient.       Home Medications Prior to Admission medications   Medication Sig Start Date End Date Taking? Authorizing Provider  albuterol (VENTOLIN HFA) 108 (90 Base) MCG/ACT inhaler TAKE 2 PUFFS BY MOUTH EVERY 6 HOURS AS NEEDED FOR WHEEZE OR SHORTNESS OF BREATH 09/24/21   Ardith Dark, MD  azelastine (ASTELIN) 0.1 % nasal spray Place 2 sprays into both nostrils 2 (two) times daily. 01/10/22   Ardith Dark, MD  blood glucose meter kit and supplies KIT Dispense based on patient and insurance preference. Use up to four times daily as directed. 07/14/21   Ardith Dark, MD  busPIRone (BUSPAR) 10 MG tablet TAKE 1 TABLET BY MOUTH TWICE A DAY 10/04/22   Ardith Dark, MD  citalopram (CELEXA) 20 MG tablet TAKE 1 TABLET BY MOUTH EVERY DAY Patient not taking: Reported on 08/19/2022 11/24/21   Ardith Dark, MD  citalopram (CELEXA) 40 MG tablet TAKE 1 TABLET BY MOUTH EVERY DAY 08/29/22   Ardith Dark, MD  FLOVENT [facility] 110 MCG/ACT inhaler INHALE 2 PUFFS INTO THE LUNGS TWICE A DAY 10/29/20   Martina Sinner, MD  Galcanezumab-gnlm [facility]) 120 MG/ML SOAJ Inject 1ml monthly 01/08/21   Ardith Dark, MD  glucose blood test strip Use as instructed E11.9 07/16/21   Ardith Dark, MD  glucose blood test strip Use as instructed E11.9 07/19/21   Ardith Dark, MD  ketoconazole (NIZORAL) 2 % cream APPLY  TO AFFECTED AREA TWICE A DAY 06/02/22   Ardith Dark, MD  montelukast (SINGULAIR) 10 MG tablet TAKE 1 TABLET BY MOUTH EVERYDAY AT BEDTIME 05/14/21   Ardith Dark, MD  OneTouch Delica Lancets 33G MISC Use TID as needed for glucose check E11.9 07/19/21   Ardith Dark, MD  spironolactone (ALDACTONE) 100 MG tablet TAKE 2 TABLETS BY MOUTH EVERY DAY 06/02/22   Ardith Dark, MD  SUMAtriptan (IMITREX) 50 MG tablet TAKE 1 TABLET (50 MG TOTAL) BY MOUTH EVERY 2 (TWO) HOURS AS NEEDED FOR MIGRAINE. MAY REPEAT IN 2 HOURS IF HEADACHE PERSISTS OR RECURS 08/10/21   Ardith Dark, MD  valACYclovir (VALTREX) 1000 MG tablet TAKE 1 TABLET BY MOUTH EVERY DAY 08/29/22   Ardith Dark, MD  Vitamin D, Ergocalciferol, (DRISDOL) 1.25 MG (50000 UNIT) CAPS capsule TAKE 1 CAPSULE (50,000 UNITS TOTAL) BY MOUTH EVERY 7 (SEVEN) DAYS 08/19/21   Ardith Dark, MD      Allergies    Ceclor [cefaclor], Nsaids, and Pediapred [prednisolone sodium phosphate]    Review of Systems   Review of Systems  All other systems reviewed and are negative.   Physical Exam Updated Vital Signs BP 134/85 (BP Location: Left Arm)   Pulse 70   Temp 98.3 F (36.8 C) (Tympanic)   Resp 18   Ht  5\' 3"  (1.6 m)   Wt 93.4 kg   LMP 01/05/2023 (Exact Date)   SpO2 100%   BMI 36.49 kg/m  Physical Exam Vitals and nursing note reviewed.  Constitutional:      Appearance: Normal appearance.  Pulmonary:     Effort: Pulmonary effort is normal.  Musculoskeletal:     Comments: The left ankle is noted to have swelling and tenderness just inferior to the lateral malleolus and extending to the top of the foot.  There is slight ecchymosis noted.  DP pulses are palpable and motor and sensation are intact throughout the foot.  Skin:    General: Skin is warm and dry.  Neurological:     Mental Status: She is alert.     ED Results / Procedures / Treatments   Labs (all labs ordered are listed, but only abnormal results are displayed) Labs  Reviewed - No data to display  EKG None  Radiology DG Foot Complete Left  Result Date: 01/09/2023 CLINICAL DATA:  Status post fall. EXAM: LEFT FOOT - COMPLETE 3+ VIEW COMPARISON:  None Available. FINDINGS: There is no evidence of fracture or dislocation. There is no evidence of arthropathy or other focal bone abnormality. Diffuse ankle soft tissue swelling is seen. IMPRESSION: Diffuse ankle soft tissue swelling without evidence of acute fracture or dislocation. Electronically Signed   By: Aram Candela M.D.   On: 01/09/2023 00:41   DG Ankle Complete Left  Result Date: 01/09/2023 CLINICAL DATA:  Status post fall. EXAM: LEFT ANKLE COMPLETE - 3+ VIEW COMPARISON:  None Available. FINDINGS: There is no evidence of fracture, dislocation, or joint effusion. A large plantar calcaneal spur is seen. There is no evidence of arthropathy or other focal bone abnormality. Diffuse soft tissue swelling is seen which may be, in part, related to the patient's body habitus. IMPRESSION: Diffuse soft tissue swelling without evidence of acute fracture or dislocation. Electronically Signed   By: Aram Candela M.D.   On: 01/09/2023 00:40    Procedures Procedures    Medications Ordered in ED Medications - No data to display  ED Course/ Medical Decision Making/ A&P  X-rays are negative for fracture.  This will be treated as a sprain with rest, ice, elevation, and follow-up with primary doctor if not improving in the next 1 to 2 weeks.  Final Clinical Impression(s) / ED Diagnoses Final diagnoses:  None    Rx / DC Orders ED Discharge Orders     None         Geoffery Lyons, MD 01/09/23 [phone]

## 2023-01-09 NOTE — Discharge Instructions (Signed)
Wear Ace bandage for comfort and support.  Elevate your leg is much as possible for the next 2 days.  Ice for 20 minutes every 2 hours while awake for the next 2 days.  Take Tylenol 1000 mg every 6 hours as needed for pain.  Weightbearing as tolerated.  Follow-up with primary doctor if not improving in the next 7 to 10 days.

## 2023-01-09 NOTE — ED Triage Notes (Signed)
Pt fell yesterday off front steps landing on her left ankle/foot +swelling, states she can barely walk on it.  In [facility] in triage

## 2023-04-14 ENCOUNTER — Encounter: Payer: Self-pay | Admitting: Family Medicine

## 2023-04-14 ENCOUNTER — Other Ambulatory Visit: Payer: Self-pay | Admitting: *Deleted

## 2023-04-14 ENCOUNTER — Ambulatory Visit: Payer: BC Managed Care – PPO | Admitting: Family Medicine

## 2023-04-14 VITALS — BP 128/83 | HR 65 | Temp 98.0°F | Ht 63.0 in | Wt 208.4 lb

## 2023-04-14 DIAGNOSIS — E282 Polycystic ovarian syndrome: Secondary | ICD-10-CM | POA: Diagnosis not present

## 2023-04-14 DIAGNOSIS — G4733 Obstructive sleep apnea (adult) (pediatric): Secondary | ICD-10-CM | POA: Diagnosis not present

## 2023-04-14 DIAGNOSIS — E611 Iron deficiency: Secondary | ICD-10-CM

## 2023-04-14 DIAGNOSIS — M2550 Pain in unspecified joint: Secondary | ICD-10-CM

## 2023-04-14 DIAGNOSIS — G43809 Other migraine, not intractable, without status migrainosus: Secondary | ICD-10-CM

## 2023-04-14 DIAGNOSIS — F419 Anxiety disorder, unspecified: Secondary | ICD-10-CM

## 2023-04-14 DIAGNOSIS — M5416 Radiculopathy, lumbar region: Secondary | ICD-10-CM

## 2023-04-14 DIAGNOSIS — N946 Dysmenorrhea, unspecified: Secondary | ICD-10-CM | POA: Diagnosis not present

## 2023-04-14 DIAGNOSIS — R7989 Other specified abnormal findings of blood chemistry: Secondary | ICD-10-CM

## 2023-04-14 DIAGNOSIS — Z9884 Bariatric surgery status: Secondary | ICD-10-CM

## 2023-04-14 DIAGNOSIS — E538 Deficiency of other specified B group vitamins: Secondary | ICD-10-CM

## 2023-04-14 DIAGNOSIS — J45909 Unspecified asthma, uncomplicated: Secondary | ICD-10-CM

## 2023-04-14 DIAGNOSIS — I10 Essential (primary) hypertension: Secondary | ICD-10-CM

## 2023-04-14 LAB — LIPID PANEL
Cholesterol: 211 mg/dL — ABNORMAL HIGH (ref 0–200)
HDL: 43.8 mg/dL (ref >39.00)
LDL Cholesterol: 132 mg/dL — ABNORMAL HIGH (ref 0–99)
NonHDL: 166.98
Total CHOL/HDL Ratio: 5
Triglycerides: 177 mg/dL — ABNORMAL HIGH (ref 0.0–149.0)
VLDL: 35.4 mg/dL (ref 0.0–40.0)

## 2023-04-14 LAB — CBC
HCT: 37.7 % (ref 36.0–46.0)
Hemoglobin: 12 g/dL (ref 12.0–15.0)
MCHC: 31.8 g/dL (ref 30.0–36.0)
MCV: 77.4 fL — ABNORMAL LOW (ref 78.0–100.0)
Platelets: 289 10*3/uL (ref 150.0–400.0)
RBC: 4.86 Mil/uL (ref 3.87–5.11)
RDW: 14.1 % (ref 11.5–15.5)
WBC: 6.5 10*3/uL (ref 4.0–10.5)

## 2023-04-14 LAB — COMPREHENSIVE METABOLIC PANEL
ALT: 23 U/L (ref 0–35)
AST: 19 U/L (ref 0–37)
Albumin: 4.6 g/dL (ref 3.5–5.2)
Alkaline Phosphatase: 64 U/L (ref 39–117)
BUN: 22 mg/dL (ref 6–23)
CO2: 27 meq/L (ref 19–32)
Calcium: 9.6 mg/dL (ref 8.4–10.5)
Chloride: 105 meq/L (ref 96–112)
Creatinine, Ser: 0.68 mg/dL (ref 0.40–1.20)
GFR: 108.59 mL/min (ref >60.00)
Glucose, Bld: 84 mg/dL (ref 70–99)
Potassium: 4.5 meq/L (ref 3.5–5.1)
Sodium: 139 meq/L (ref 135–145)
Total Bilirubin: 0.4 mg/dL (ref 0.2–1.2)
Total Protein: 6.6 g/dL (ref 6.0–8.3)

## 2023-04-14 LAB — SEDIMENTATION RATE: Sed Rate: 11 mm/h (ref 0–20)

## 2023-04-14 LAB — VITAMIN D 25 HYDROXY (VIT D DEFICIENCY, FRACTURES): VITD: 22.31 ng/mL — ABNORMAL LOW (ref 30.00–100.00)

## 2023-04-14 LAB — TSH: TSH: 3.28 u[IU]/mL (ref 0.35–5.50)

## 2023-04-14 LAB — HEMOGLOBIN A1C: Hgb A1c MFr Bld: 5.5 % (ref 4.6–6.5)

## 2023-04-14 LAB — VITAMIN B12: Vitamin B-12: 423 pg/mL (ref 211–911)

## 2023-04-14 LAB — C-REACTIVE PROTEIN: CRP: <1 mg/dL (ref 0.5–20.0)

## 2023-04-14 MED ORDER — MULTIVITAMIN ADULT (MINERALS) PO TABS: ORAL_TABLET | ORAL | 3 refills | Status: DC

## 2023-04-14 MED ORDER — ALBUTEROL SULFATE HFA 108 (90 BASE) MCG/ACT IN AERS: 2.0000 | INHALATION_SPRAY | Freq: Four times a day (QID) | RESPIRATORY_TRACT | 3 refills | Status: DC | PRN

## 2023-04-14 NOTE — Telephone Encounter (Signed)
See note

## 2023-04-14 NOTE — Assessment & Plan Note (Signed)
Migraines have worsened since our last visit.  She has not been on Emgality for the last several months.  As above she likely has multiple contributing factors including poor sleep and potential vitamin deficiencies that are contributing.  She did well on Emgality previously however we will hold off on starting this until we can complete workup as above.  She will follow-up with [place] in a few weeks and we can consider restarting Emgality at some point in the future.

## 2023-04-14 NOTE — Assessment & Plan Note (Signed)
Likely multifactorial.  She did have workup a couple of years ago with neurology and sports medicine which showed mild degenerative changes but otherwise was mostly unrevealing.  She does have untreated OSA which is probably contributing to her muscular pain and will be treating this as below.  Will also checking labs today to look for any possible vitamin deficiencies which could be contributing.  Will also check inflammatory markers and ANA to rule out other causes as well.  We will have her follow back up with sports medicine if symptoms progress and above workup is negative.

## 2023-04-14 NOTE — Assessment & Plan Note (Signed)
Check B12.  Not currently on any supplementation.

## 2023-04-14 NOTE — Assessment & Plan Note (Signed)
Likely contributing to her above dysmenorrhea.  Will be referring her to gynecology for further evaluation as above.  She is no longer on her metformin or spironolactone.

## 2023-04-14 NOTE — Assessment & Plan Note (Signed)
Overall stable reassuring exam today.  Uses albuterol as needed.  Will refill this today and refer her to see the pulmonologist.

## 2023-04-14 NOTE — Progress Notes (Signed)
Debra Miller is a 42 y.o. female who presents today for an office visit.  Assessment/Plan:  New/Acute Problems: Fatigue  Had extensive discussion with patient and her family today regarding her symptoms today.  She is mostly concerned about fatigue and diffuse bodyaches.  Her fatigue is likely multifactorial though main contributor like is her inadequately treated OSA.  She also is having heavy periods and may be anemic/iron deficient.  She also has been off of her bariatric vitamins after having her bypass surgery a couple of years ago.  We will check labs today including CBC, c-Met, TSH, iron, B12, and vitamin D.  Will be referring her back for treatment for her sleep apnea as below.  Will be treating her other chronic conditions as below as well as they are likely contributing.  Chronic Problems Addressed Today: Dysmenorrhea Patient with both very painful periods and irregular bleeding over the last year or so.  Concern for potential uterine fibroids.  She needs further gynecologic workup for this.  Will be referring her back to gynecology.  Be checking labs today to assess for iron deficiency as above.  Polyarthralgia Likely multifactorial.  She did have workup a couple of years ago with neurology and sports medicine which showed mild degenerative changes but otherwise was mostly unrevealing.  She does have untreated OSA which is probably contributing to her muscular pain and will be treating this as below.  Will also checking labs today to look for any possible vitamin deficiencies which could be contributing.  Will also check inflammatory markers and ANA to rule out other causes as well.  We will have her follow back up with sports medicine if symptoms progress and above workup is negative.  PCOS (polycystic ovarian syndrome) Likely contributing to her above dysmenorrhea.  Will be referring her to gynecology for further evaluation as above.  She is no longer on her metformin or  spironolactone.  Low vitamin D level She is not on any supplementation.  Could be contributing to her other issues.  Will check vitamin D today.  OSA (obstructive sleep apnea) She has been off of PAP for over a year.  This is likely contributing to a lot of her symptoms.  She has lost about 100 pounds over the last year and is not sure if her current settings are up-to-date anymore.  She would like to have repeat sleep study performed.  Will place referral to pulmonology to help her with this.  Essential hypertension Blood pressure at goal today without medications.  Previously on spironolactone.  Does not need to restart this today.  S/P gastric bypass She is maintained her weight over the last year and is down about 100 pounds since her maximum weight however she has been off of all of her bariatric vitamins over the last year or so.  She likely does have some nutrient deficiency which is causing some of her other symptoms.  We are checking labs today and will be restarting her bariatric vitamin.  Asthma Overall stable reassuring exam today.  Uses albuterol as needed.  Will refill this today and refer her to see the pulmonologist.  Migraines Migraines have worsened since our last visit.  She has not been on Emgality for the last several months.  As above she likely has multiple contributing factors including poor sleep and potential vitamin deficiencies that are contributing.  She did well on Emgality previously however we will hold off on starting this until we can complete workup as above.  She will follow-up with [place] in a few weeks and we can consider restarting Emgality at some point in the future.  Anxiety She is no longer on Celexa or BuSpar.  Previously was doing well with this but however discontinued about a year or so ago.  Unclear if this is contributing to her above symptoms or if her symptoms are making her anxiety worse.  She would like to hold off on starting any medications for  anxiety at this point until we can complete workup as above.  She will follow-up with [place] in a few weeks.  We could go back to BuSpar and Celexa as this has worked well for her in the past however would also consider TCA such as amitriptyline as this may help with her migraine, chronic pain, and insomnia issues.  Iron deficiency Check iron panel today.  She is not currently taking any medications for this.  B12 deficiency Check B12.  Not currently on any supplementation.     Subjective:  HPI:  See A/P for status of chronic conditions.  Patient is here today for follow-up.  She has multiple issues that she would like to discuss today.  She was last seen here about 15 months ago.  Since her last visit she has been off of all of her medications except Valtrex.  She also has not been using her CPAP.  She has lost about 100 pounds since having bariatric surgery couple of years ago.  Her main concerns that she would like to discuss today are fatigue and diffuse body pains.  Fatigue has been going on for a while but had significantly worsened within the last year or so.  She does not have any energy.  She does not feel like she is sleeping well.  She has been diagnosed with OSA in the past but has not been compliant with her CPAP.  She is having hard time falling asleep and staying asleep.  No treatments tried for this.  She does have anxiety and stress at baseline but does not think that this is contributing.  She is no longer taking her Celexa or BuSpar for this.  She has had continued issue with pain from "head to toe".  She is still having ongoing issues with migraines which does seem to be worsening.  We previously had her on Emgality and Imitrex.  This combination worked well for her however she has been off of this for the past year or so.  She has had occasional paresthesias and she has seen neurology for this.  No weakness.  No reported vision changes.  She is having diffuse pain in her neck, low  back, hips, and knees.  She has seen sports medicine and orthopedics for this in the past.  Had extensive workup which was essentially negative.  Symptoms do seem to be getting worse.No specific treatments tried.  She also is having more issues with irregular menstrual.  States that she had 3 episodes of menstrual bleeding in August.  Associated with severe cramping.  She also has pain with sex.  No vaginal discharge.  Occasional nausea with pain.  Pain feels like a severe spasm in her lower abdomen.  This been getting worse over the last several months as well.  She has been off of her bariatric vitamins as well.       Objective:  Physical Exam: BP 128/83   Pulse 65   Temp 98 F (36.7 C) (Temporal)   Ht 5\' 3"  (1.6 m)  Wt 208 lb 6.4 oz (94.5 kg)   SpO2 98%   BMI 36.92 kg/m   Wt Readings from Last 3 Encounters:  04/14/23 208 lb 6.4 oz (94.5 kg)  01/09/23 206 lb (93.4 kg)  08/19/22 206 lb 6 oz (93.6 kg)    Gen: No acute distress, resting comfortably CV: Regular rate and rhythm with no murmurs appreciated Pulm: Normal work of breathing, clear to auscultation bilaterally with no crackles, wheezes, or rhonchi Neuro: Grossly normal, moves all extremities Psych: Normal affect and thought content  Time Spent: 45 minutes of total time was spent on the date of the encounter performing the following actions: chart review prior to seeing the patient, obtaining history, performing a medically necessary exam, counseling on the treatment plan, placing orders, and documenting in our EHR.        Katina Degree. Jimmey Ralph, MD 04/14/2023 11:21 AM

## 2023-04-14 NOTE — Assessment & Plan Note (Signed)
Check iron panel today.  She is not currently taking any medications for this.

## 2023-04-14 NOTE — Assessment & Plan Note (Signed)
She is no longer on Celexa or BuSpar.  Previously was doing well with this but however discontinued about a year or so ago.  Unclear if this is contributing to her above symptoms or if her symptoms are making her anxiety worse.  She would like to hold off on starting any medications for anxiety at this point until we can complete workup as above.  She will follow-up with [place] in a few weeks.  We could go back to BuSpar and Celexa as this has worked well for her in the past however would also consider TCA such as amitriptyline as this may help with her migraine, chronic pain, and insomnia issues.

## 2023-04-14 NOTE — Assessment & Plan Note (Signed)
Patient with both very painful periods and irregular bleeding over the last year or so.  Concern for potential uterine fibroids.  She needs further gynecologic workup for this.  Will be referring her back to gynecology.  Be checking labs today to assess for iron deficiency as above.

## 2023-04-14 NOTE — Assessment & Plan Note (Signed)
Blood pressure at goal today without medications.  Previously on spironolactone.  Does not need to restart this today.

## 2023-04-14 NOTE — Assessment & Plan Note (Signed)
She is maintained her weight over the last year and is down about 100 pounds since her maximum weight however she has been off of all of her bariatric vitamins over the last year or so.  She likely does have some nutrient deficiency which is causing some of her other symptoms.  We are checking labs today and will be restarting her bariatric vitamin.

## 2023-04-14 NOTE — Assessment & Plan Note (Signed)
She has been off of PAP for over a year.  This is likely contributing to a lot of her symptoms.  She has lost about 100 pounds over the last year and is not sure if her current settings are up-to-date anymore.  She would like to have repeat sleep study performed.  Will place referral to pulmonology to help her with this.

## 2023-04-14 NOTE — Assessment & Plan Note (Signed)
She is not on any supplementation.  Could be contributing to her other issues.  Will check vitamin D today.

## 2023-04-14 NOTE — Patient Instructions (Signed)
It was very nice to see you today!  Will refer you to see the gynecologist.  Thank you may have a fibroid.  I will refer you back to see a lung doctor for your sleep apnea and asthma.  We will check labs today.  Return in about 3 months (around 07/15/2023) for Follow Up.   Take care, Dr Jimmey Ralph  PLEASE NOTE:  If you had any lab tests, please let us know if you have not heard back within a few days. You may see your results on mychart before we have a chance to review them but we will give you a call once they are reviewed by [place].   If we ordered any referrals today, please let us know if you have not heard from their office within the next week.   If you had any urgent prescriptions sent in today, please check with the pharmacy within an hour of our visit to make sure the prescription was transmitted appropriately.   Please try these tips to maintain a healthy lifestyle:  Eat at least 3 REAL meals and 1-2 snacks per day.  Aim for no more than 5 hours between eating.  If you eat breakfast, please do so within one hour of getting up.   Each meal should contain half fruits/vegetables, one quarter protein, and one quarter carbs (no bigger than a computer mouse)  Cut down on sweet beverages. This includes juice, soda, and sweet tea.   Drink at least 1 glass of water with each meal and aim for at least 8 glasses per day  Exercise at least 150 minutes every week.    Preventive Care 80-45 Years Old, Female Preventive care refers to lifestyle choices and visits with your health care provider that can promote health and wellness. Preventive care visits are also called wellness exams. What can I expect for my preventive care visit? Counseling Your health care provider may ask you questions about your: Medical history, including: Past medical problems. Family medical history. Pregnancy history. Current health, including: Menstrual cycle. Method of birth control. Emotional  well-being. Home life and relationship well-being. Sexual activity and sexual health. Lifestyle, including: Alcohol, nicotine or tobacco, and drug use. Access to firearms. Diet, exercise, and sleep habits. Work and work Astronomer. Sunscreen use. Safety issues such as seatbelt and bike helmet use. Physical exam Your health care provider will check your: Height and weight. These may be used to calculate your BMI (body mass index). BMI is a measurement that tells if you are at a healthy weight. Waist circumference. This measures the distance around your waistline. This measurement also tells if you are at a healthy weight and may help predict your risk of certain diseases, such as type 2 diabetes and high blood pressure. Heart rate and blood pressure. Body temperature. Skin for abnormal spots. What immunizations do I need?  Vaccines are usually given at various ages, according to a schedule. Your health care provider will recommend vaccines for you based on your age, medical history, and lifestyle or other factors, such as travel or where you work. What tests do I need? Screening Your health care provider may recommend screening tests for certain conditions. This may include: Lipid and cholesterol levels. Diabetes screening. This is done by checking your blood sugar (glucose) after you have not eaten for a while (fasting). Pelvic exam and Pap test. Hepatitis B test. Hepatitis C test. HIV (human immunodeficiency virus) test. STI (sexually transmitted infection) testing, if you are at risk. Lung  cancer screening. Colorectal cancer screening. Mammogram. Talk with your health care provider about when you should start having regular mammograms. This may depend on whether you have a family history of breast cancer. BRCA-related cancer screening. This may be done if you have a family history of breast, ovarian, tubal, or peritoneal cancers. Bone density scan. This is done to screen for  osteoporosis. Talk with your health care provider about your test results, treatment options, and if necessary, the need for more tests. Follow these instructions at home: Eating and drinking  Eat a diet that includes fresh fruits and vegetables, whole grains, lean protein, and low-fat dairy products. Take vitamin and mineral supplements as recommended by your health care provider. Do not drink alcohol if: Your health care provider tells you not to drink. You are pregnant, may be pregnant, or are planning to become pregnant. If you drink alcohol: Limit how much you have to 0-1 drink a day. Know how much alcohol is in your drink. In the [place] one drink equals one 12 oz bottle of beer (355 mL), one 5 oz glass of wine (148 mL), or one 1 oz glass of hard liquor (44 mL). Lifestyle Brush your teeth every morning and night with fluoride toothpaste. Floss one time each day. Exercise for at least 30 minutes 5 or more days each week. Do not use any products that contain nicotine or tobacco. These products include cigarettes, chewing tobacco, and vaping devices, such as e-cigarettes. If you need help quitting, ask your health care provider. Do not use drugs. If you are sexually active, practice safe sex. Use a condom or other form of protection to prevent STIs. If you do not wish to become pregnant, use a form of birth control. If you plan to become pregnant, see your health care provider for a prepregnancy visit. Take aspirin only as told by your health care provider. Make sure that you understand how much to take and what form to take. Work with your health care provider to find out whether it is safe and beneficial for you to take aspirin daily. Find healthy ways to manage stress, such as: Meditation, yoga, or listening to music. Journaling. Talking to a trusted person. Spending time with friends and family. Minimize exposure to UV radiation to reduce your risk of skin cancer. Safety Always wear  your seat belt while driving or riding in a vehicle. Do not drive: If you have been drinking alcohol. Do not ride with someone who has been drinking. When you are tired or distracted. While texting. If you have been using any mind-altering substances or drugs. Wear a helmet and other protective equipment during sports activities. If you have firearms in your house, make sure you follow all gun safety procedures. Seek help if you have been physically or sexually abused. What's next? Visit your health care provider once a year for an annual wellness visit. Ask your health care provider how often you should have your eyes and teeth checked. Stay up to date on all vaccines. This information is not intended to replace advice given to you by your health care provider. Make sure you discuss any questions you have with your health care provider. Document Revised: 12/16/2020 Document Reviewed: 12/16/2020 Elsevier Patient Education  2024 [employer].

## 2023-04-15 LAB — IRON,TIBC AND FERRITIN PANEL
%SAT: 5 % — ABNORMAL LOW (ref 16–45)
Ferritin: 3 ng/mL — ABNORMAL LOW (ref 16–154)
Iron: 30 ug/dL — ABNORMAL LOW (ref 40–190)
TIBC: 551 ug/dL — ABNORMAL HIGH (ref 250–450)

## 2023-04-15 LAB — ANA: Anti Nuclear Antibody (ANA): NEGATIVE

## 2023-04-17 NOTE — Progress Notes (Signed)
Her Iron counts are very low.  This probably explains a lot of the symptoms that she has been experiencing.  Recommend that she start ferrous sulfate 65 mg every other day on an empty stomach with vitamin C.  We should recheck her iron levels in about 3 months.  It is also important that she follows up with gynecology soon to discuss her menstrual bleeding.  She should let us know if she does not hear anything about the referral by this week.  Her vitamin D is a little bit low.  This should improve with the vitamins that we sent in.  We can also recheck this in 3 months.  The rest of her labs are all normal.  I would like to see her back in 3 months as we discussed at her office visit however she should come back sooner if any new symptoms arise or if she is not having any improvement in symptoms

## 2023-04-17 NOTE — Telephone Encounter (Signed)
This shoulder have been sent in as 90 supply. Can we make sure that this was sent in?  Debra Miller. Jimmey Ralph, MD 04/17/2023 11:24 AM

## 2023-04-18 NOTE — Telephone Encounter (Signed)
Rx was resend on 04/14/2023

## 2023-04-21 ENCOUNTER — Telehealth: Payer: Self-pay | Admitting: Family Medicine

## 2023-04-21 NOTE — Telephone Encounter (Signed)
Patient returned call in regards to lab results. Informed pt she could view pcp lab notes via Mychart. Pt verbalized understanding. I offered pt to make 3 month f/u but she is already scheduled for this on 07/17/23.

## 2023-04-24 NOTE — Telephone Encounter (Signed)
See results note. 

## 2023-04-25 ENCOUNTER — Encounter (HOSPITAL_BASED_OUTPATIENT_CLINIC_OR_DEPARTMENT_OTHER): Payer: Self-pay | Admitting: Certified Nurse Midwife

## 2023-04-25 ENCOUNTER — Ambulatory Visit (INDEPENDENT_AMBULATORY_CARE_PROVIDER_SITE_OTHER): Payer: BC Managed Care – PPO | Admitting: Certified Nurse Midwife

## 2023-04-25 ENCOUNTER — Other Ambulatory Visit (HOSPITAL_COMMUNITY)
Admission: RE | Admit: 2023-04-25 | Discharge: 2023-04-25 | Disposition: A | Discharge status: Home / Self Care | Payer: BC Managed Care – PPO | Source: Ambulatory Visit | Attending: Certified Nurse Midwife | Admitting: Certified Nurse Midwife

## 2023-04-25 VITALS — BP 139/87 | HR 79 | Ht 63.0 in | Wt 207.6 lb

## 2023-04-25 DIAGNOSIS — Z01419 Encounter for gynecological examination (general) (routine) without abnormal findings: Secondary | ICD-10-CM

## 2023-04-25 DIAGNOSIS — Z1283 Encounter for screening for malignant neoplasm of skin: Secondary | ICD-10-CM

## 2023-04-25 DIAGNOSIS — R7989 Other specified abnormal findings of blood chemistry: Secondary | ICD-10-CM

## 2023-04-25 DIAGNOSIS — Z1231 Encounter for screening mammogram for malignant neoplasm of breast: Secondary | ICD-10-CM | POA: Diagnosis not present

## 2023-04-25 DIAGNOSIS — Z124 Encounter for screening for malignant neoplasm of cervix: Secondary | ICD-10-CM

## 2023-04-25 DIAGNOSIS — I10 Essential (primary) hypertension: Secondary | ICD-10-CM

## 2023-04-25 DIAGNOSIS — E282 Polycystic ovarian syndrome: Secondary | ICD-10-CM

## 2023-04-25 DIAGNOSIS — Z9889 Other specified postprocedural states: Secondary | ICD-10-CM | POA: Insufficient documentation

## 2023-04-25 DIAGNOSIS — L700 Acne vulgaris: Secondary | ICD-10-CM

## 2023-04-25 DIAGNOSIS — N921 Excessive and frequent menstruation with irregular cycle: Secondary | ICD-10-CM | POA: Diagnosis not present

## 2023-04-25 DIAGNOSIS — D500 Iron deficiency anemia secondary to blood loss (chronic): Secondary | ICD-10-CM

## 2023-04-25 DIAGNOSIS — Z6836 Body mass index (BMI) 36.0-36.9, adult: Secondary | ICD-10-CM

## 2023-04-25 DIAGNOSIS — L680 Hirsutism: Secondary | ICD-10-CM

## 2023-04-25 DIAGNOSIS — N393 Stress incontinence (female) (male): Secondary | ICD-10-CM

## 2023-04-25 DIAGNOSIS — N946 Dysmenorrhea, unspecified: Secondary | ICD-10-CM

## 2023-04-25 MED ORDER — VITAMIN D (ERGOCALCIFEROL) 1.25 MG (50000 UNIT) PO CAPS
50000.0000 [IU] | ORAL_CAPSULE | ORAL | 4 refills | Status: AC
Start: 2023-04-25 — End: ?

## 2023-04-25 NOTE — Progress Notes (Signed)
Declines Flu Vaccine   Debra Miller is a 41yo MWF [id number] here to establish care.  Accompanied by her supportive spouse. She has five living children ages 20-24. Youngest 3 live with her and she homeschools.  She had tubal ligation after youngest was born. Hx 5 vaginal deliveries.  Pt reports periods have always been "irregular". Periods are not "monthly" and not "regular". Sometimes skips a month. Sometimes has two in a month. Periods are described as "always heavy" with +blood clots and always "painful".  Pt reports significant issue with hirsutism and acne, gains weight easily and has difficulty losing weight. Hx of Weight Loss Surgery and reports she lost 100lb. Had a diagnosis of hypertension prior to weight loss but not on medication currently and BP today 139/87.  She was recently diagnosed with anemia. Long discussion today regarding anemia possibly secondary to her menorrhagia. She has not started Iron yet, and doesn't really like pills/tablets, so she is encouraged to buy OTC Floradix liquid iron supplement and take 2 tsp every other day.   Long discussion today regarding irregular menses, likely PCOS, hirsutism, acne and options for management including hormonal options including Mirena IUD. Pt was encouraged to consider a Mirena IUD due to menorrhagia and dysmenorrhea and a general dislike for taking oral medications.  No prior mammogram but patient is open to mammogram and agrees to do monthly self breast exams.  Declines Flu Vaccine today. Has a Primary Care Provider. Vitamin D deficient.   Pap 2015 Negative. Pap 2014 Negative   42 y.o. [id number] Married White or Caucasian female here for annual exam.    Patient's last menstrual period was 03/05/2023 (approximate).          Sexually active: Yes.    The current method of family planning is tubal ligation.     Upstream - 04/25/23 0834       Pregnancy Intention Screening   Does the patient want to become pregnant in the next year?  No    Does the patient's partner want to become pregnant in the next year? No    Would the patient like to discuss contraceptive options today? No            The pregnancy intention screening data noted above was reviewed. Potential methods of contraception were discussed. The patient elected to proceed with No data recorded.  Exercising: Yes.     Smoker:  no  Health Maintenance: Pap:  Last Pap ?2015 Negative History of abnormal Pap:  Yes, Pt thinks she has Hx LEEP, Hx HGSIL MMG:  Has never had mammogram, 1st mammogram ordered (pt agreeable) Colonoscopy:  n/a BMD:   n/a Screening Labs: Collected by PCP   reports that she has never smoked. She has never used smokeless tobacco. She reports that she does not drink alcohol and does not use drugs.  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   Depression    Diabetes mellitus without complication (HCC)    type II   Dizziness    Dysmenorrhea    Eczema    Elevated triglycerides with high cholesterol    Frequent headaches    GERD (gastroesophageal reflux disease)    H/O cold sores    High cholesterol    Hypertension    high blood pressure readings  Migraines    with aura   Obesity    PCOS (polycystic ovarian syndrome)    Seasonal allergies    Sleep apnea    Urine incontinence    UTI (urinary tract infection)     Past Surgical History:  Procedure Laterality Date   KNEE SURGERY Left 2018   RADIOLOGY WITH ANESTHESIA N/A 09/22/2020   Procedure: MRI WITH ANESTHESIA CERVICAL SPINE WITHOUT AND BRAIN WITHOUT;  Surgeon: Radiologist, Medication, MD;  Location: [facility] OR;  Service: Radiology;  Laterality: N/A;   RADIOLOGY WITH ANESTHESIA N/A 05/18/2021   Procedure: MRI WITH ANESTHESIA  THORACIC WITHOUT CONTRAST AND LUMBAR WITHOUT CONTRAST;  Surgeon: Radiologist,  Medication, MD;  Location: [facility] OR;  Service: Radiology;  Laterality: N/A;   TONSILLECTOMY AND ADENOIDECTOMY     TUBAL LIGATION  2008    Current Outpatient Medications  Medication Sig Dispense Refill   albuterol (VENTOLIN HFA) 108 (90 Base) MCG/ACT inhaler Inhale 2 puffs into the lungs every 6 (six) hours as needed for wheezing or shortness of breath. 18 g 3   Multiple Vitamins-Minerals (MULTIVITAMIN ADULT, MINERALS,) TABS Take daily for post bariatric supplementation. 90 tablet 3   valACYclovir (VALTREX) 1000 MG tablet TAKE 1 TABLET BY MOUTH EVERY DAY 90 tablet 3   Vitamin D, Ergocalciferol, (DRISDOL) 1.25 MG (50000 UNIT) CAPS capsule Take 1 capsule (50,000 Units total) by mouth every 7 (seven) days. 12 capsule 4   No current facility-administered medications for this visit.    Family History  Problem Relation Age of Onset   Arthritis Mother    High Cholesterol Mother    Hypertension Mother    Diabetes Mother    Rheum arthritis Mother    Asthma Mother    Hyperlipidemia Mother    Migraines Mother    Thyroid disease Mother    Colon polyps Mother    Arthritis Maternal Grandmother    Breast cancer Maternal Grandmother    High Cholesterol Maternal Grandmother    Stroke Maternal Grandmother    Hypertension Maternal Grandmother    Migraines Maternal Grandmother    Colon polyps Sister    Colon polyps Maternal Aunt    Colon cancer Neg Hx    Esophageal cancer Neg Hx    Rectal cancer Neg Hx    Stomach cancer Neg Hx     ROS: Constitutional: positive for fatigue Genitourinary:positive for urinary incontinence and menorrhagia, dysmenorrhea, irregular menses, hirsutism, acne  Exam:   BP 139/87 (BP Location: Right Arm, Patient Position: Sitting, Cuff Size: Large)   Pulse 79   Ht 5\' 3"  (1.6 m)   Wt 207 lb 9.6 oz (94.2 kg)   LMP 03/05/2023 (Approximate)   BMI 36.77 kg/m   Height: 5\' 3"  (160 cm)  General appearance: alert, cooperative and appears stated age Head: Normocephalic,  without obvious abnormality, atraumatic Neck: no adenopathy, supple, symmetrical Lungs: clear to auscultation bilaterally Breasts: normal appearance, no masses or tenderness, Inspection negative, No nipple retraction or dimpling, No nipple discharge or bleeding, No axillary or supraclavicular adenopathy, Normal to palpation without dominant masses Heart: regular rate and rhythm Abdomen: soft, non-tender; bowel sounds normal; no masses,  no organomegaly Extremities: extremities normal, atraumatic, no cyanosis or edema Skin: Skin color, texture, turgor normal. No rashes or lesions Lymph nodes: Cervical, supraclavicular, and axillary nodes normal. No abnormal inguinal nodes palpated Neurologic: Grossly normal   Pelvic: External genitalia:  no lesions              Urethra:  normal appearing urethra with  no masses, tenderness or lesions              Bartholins and Skenes: normal                 Vagina: normal appearing vagina with normal color and no discharge, no lesions              Cervix: no bleeding following Pap, no cervical motion tenderness, no lesions, and very posterior              Pap taken: Yes.   Bimanual Exam:  Uterus:   uterus mildly enlarged              Adnexa: no mass, fullness, tenderness              Anus:  normal sphincter tone, no lesions  Chaperone, Hendricks Milo, CMA, was present for exam.  Assessment/Plan:    1. Low vitamin D level -Pt desired weekly Vitamin D prescription - Vitamin D, Ergocalciferol, (DRISDOL) 1.25 MG (50000 UNIT) CAPS capsule; Take 1 capsule (50,000 Units total) by mouth every 7 (seven) days.  Dispense: 12 capsule; Refill: 4  2. Encounter for well woman exam with routine gynecological exam -Routine pap smear collected -Pt aware that if Pap Smear Neg with Negative High Risk HPV, next pap will be recommended in 3-5 years  3. Encounter for screening mammogram for malignant neoplasm of breast -Pt agreeable to screening mammogram - MM 3D  SCREENING MAMMOGRAM BILATERAL BREAST; Future  4. Menorrhagia with irregular cycle -RTO for Pelvic GYN [place] -Pt declines medication therapy at this time. Declines POP, Nexplanon, Depo, Mirena IUD, etc. -Floradix liquid iron supplement 2tsp every other day  5. Dysmenorrhea -[place] pending  6. Hirsutism -Pt with known Hx PCOS -Declines Spironolactone  7. Acne vulgaris -Referral to Derm-needs baseline skin exam  8. BMI 36.0-36.9,adult -Pt reports she has lost 100lb  9. Iron deficiency anemia due to chronic blood loss -Floradix Iron supplement OTC  10. H/O LEEP -Pap collected  11. Essential hypertension -No meds currently  12. PCOS (polycystic ovarian syndrome) -Pt declines medication at this time, prefers [place]   Declines Flu Vaccine. RTO for Pelvic GYN [place] and follow-up with MD or CNM.   [name]

## 2023-04-27 LAB — CYTOLOGY - PAP
Adequacy: ABSENT
Comment: NEGATIVE
Diagnosis: NEGATIVE
High risk HPV: NEGATIVE

## 2023-05-08 NOTE — Telephone Encounter (Signed)
Send request information to referral coordination

## 2023-05-10 ENCOUNTER — Ambulatory Visit (INDEPENDENT_AMBULATORY_CARE_PROVIDER_SITE_OTHER): Payer: BC Managed Care – PPO | Admitting: Certified Nurse Midwife

## 2023-05-10 ENCOUNTER — Other Ambulatory Visit (HOSPITAL_BASED_OUTPATIENT_CLINIC_OR_DEPARTMENT_OTHER): Payer: Self-pay | Admitting: Certified Nurse Midwife

## 2023-05-10 ENCOUNTER — Other Ambulatory Visit (HOSPITAL_COMMUNITY)
Admission: RE | Admit: 2023-05-10 | Discharge: 2023-05-10 | Disposition: A | Discharge status: Home / Self Care | Payer: BC Managed Care – PPO | Source: Ambulatory Visit | Attending: Certified Nurse Midwife | Admitting: Certified Nurse Midwife

## 2023-05-10 ENCOUNTER — Ambulatory Visit (INDEPENDENT_AMBULATORY_CARE_PROVIDER_SITE_OTHER): Payer: BC Managed Care – PPO

## 2023-05-10 ENCOUNTER — Encounter (HOSPITAL_BASED_OUTPATIENT_CLINIC_OR_DEPARTMENT_OTHER): Payer: Self-pay | Admitting: Certified Nurse Midwife

## 2023-05-10 VITALS — BP 125/90 | HR 86 | Ht 63.0 in | Wt 209.8 lb

## 2023-05-10 DIAGNOSIS — Z6836 Body mass index (BMI) 36.0-36.9, adult: Secondary | ICD-10-CM

## 2023-05-10 DIAGNOSIS — Z1231 Encounter for screening mammogram for malignant neoplasm of breast: Secondary | ICD-10-CM

## 2023-05-10 DIAGNOSIS — R7989 Other specified abnormal findings of blood chemistry: Secondary | ICD-10-CM

## 2023-05-10 DIAGNOSIS — N854 Malposition of uterus: Secondary | ICD-10-CM | POA: Diagnosis not present

## 2023-05-10 DIAGNOSIS — N393 Stress incontinence (female) (male): Secondary | ICD-10-CM

## 2023-05-10 DIAGNOSIS — E282 Polycystic ovarian syndrome: Secondary | ICD-10-CM

## 2023-05-10 DIAGNOSIS — D251 Intramural leiomyoma of uterus: Secondary | ICD-10-CM

## 2023-05-10 DIAGNOSIS — L700 Acne vulgaris: Secondary | ICD-10-CM

## 2023-05-10 DIAGNOSIS — L680 Hirsutism: Secondary | ICD-10-CM

## 2023-05-10 DIAGNOSIS — N921 Excessive and frequent menstruation with irregular cycle: Secondary | ICD-10-CM

## 2023-05-10 DIAGNOSIS — N946 Dysmenorrhea, unspecified: Secondary | ICD-10-CM

## 2023-05-10 DIAGNOSIS — Z124 Encounter for screening for malignant neoplasm of cervix: Secondary | ICD-10-CM

## 2023-05-10 DIAGNOSIS — Z1283 Encounter for screening for malignant neoplasm of skin: Secondary | ICD-10-CM

## 2023-05-10 DIAGNOSIS — N939 Abnormal uterine and vaginal bleeding, unspecified: Secondary | ICD-10-CM

## 2023-05-10 DIAGNOSIS — Z01419 Encounter for gynecological examination (general) (routine) without abnormal findings: Secondary | ICD-10-CM

## 2023-05-10 DIAGNOSIS — I10 Essential (primary) hypertension: Secondary | ICD-10-CM

## 2023-05-10 DIAGNOSIS — D259 Leiomyoma of uterus, unspecified: Secondary | ICD-10-CM

## 2023-05-10 DIAGNOSIS — D500 Iron deficiency anemia secondary to blood loss (chronic): Secondary | ICD-10-CM

## 2023-05-10 DIAGNOSIS — Z9889 Other specified postprocedural states: Secondary | ICD-10-CM

## 2023-05-10 NOTE — Progress Notes (Unsigned)
Debra Miller is a 42yo [id number] here for Pelvic US. Patient was evaluated in our office 2 weeks ago. At that time she reported irregular menstrual bleeding with heavy and painful periods. Today patient also adds that on occasion she experiences some intermenstrual spotting/bleeding. She was asked to return today for [place].  [place] today shows anteverted uterus normal in size with 3 small intramural fibroids measuring  1.5 x 1.7cm, 1.6 x .96cm and 1.7 x 1.1.  Discussed US findings with patient and recommendation for endometrial biopsy due to intermenstrual bleeding. Pt in agreement. She is aware of all options for management of menorrhagia/dysmenorrhea and declines all medication at this time. She is interested in surgical management w/ hysterectomy and desires to RTO to discuss possible hysterectomy with Dr. Rondel Baton.     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   Depression    Diabetes mellitus without complication (HCC)    type II   Dizziness    Dysmenorrhea    Eczema    Elevated triglycerides with high cholesterol    Frequent headaches    GERD (gastroesophageal reflux disease)    H/O cold sores    High cholesterol    Hypertension    high blood pressure readings    Migraines    with aura   Obesity    PCOS (polycystic ovarian syndrome)    Seasonal allergies    Sleep apnea    Urine incontinence    UTI (urinary tract infection)     MEDS:   Current Outpatient Medications on File Prior to Visit  Medication Sig Dispense Refill   albuterol (VENTOLIN HFA) 108 (90 Base) MCG/ACT inhaler Inhale 2 puffs into the lungs every 6 (six) hours as needed for wheezing or shortness of breath. 18 g 3   Multiple Vitamins-Minerals (MULTIVITAMIN ADULT, MINERALS,) TABS Take daily for post bariatric supplementation. 90 tablet 3   valACYclovir  (VALTREX) 1000 MG tablet TAKE 1 TABLET BY MOUTH EVERY DAY 90 tablet 3   Vitamin D, Ergocalciferol, (DRISDOL) 1.25 MG (50000 UNIT) CAPS capsule Take 1 capsule (50,000 Units total) by mouth every 7 (seven) days. 12 capsule 4   No current facility-administered medications on file prior to visit.    ALLERGIES: Ceclor [cefaclor], Nsaids, and Pediapred [prednisolone sodium phosphate]  PHYSICAL EXAMINATION:    BP (!) 125/90 (BP Location: Left Arm, Patient Position: Sitting, Cuff Size: Large)   Pulse 86   Ht 5\' 3"  (1.6 m) Comment: Reported  Wt 209 lb 12.8 oz (95.2 kg)   LMP 03/05/2023 (Approximate)   BMI 37.16 kg/m     General appearance: alert, cooperative and appears stated age  Pelvic: External genitalia:  no lesions              Vagina:  +blood              Cervix: multiparous appearance and no lesions              Chaperone, Hendricks Milo, CMA, was present for exam.  Endometrial Biopsy Procedure Note  Pre-operative Diagnosis: menorrhagia, AUB     Post-operative Diagnosis: same  Indications: abnormal uterine bleeding  Procedure Details   Urine pregnancy test {:16410}.  The risks (including infection, bleeding, pain, and uterine perforation) and benefits of the procedure were explained to  the patient and {desc; verbal/written:16408} informed consent was obtained.  Antibiotic prophylaxis against endocarditis {WAS:16407} indicated.   The patient was placed in the dorsal lithotomy position.  Bimanual exam showed the uterus to be in the {:16454} position.  A Graves' speculum inserted in the vagina, and the cervix prepped with povidone iodine.  Endocervical curettage with a Kevorkian curette {:16407} performed.   A sharp tenaculum was applied to the {:16411} lip of the cervix for stabilization.  A sterile uterine sound was used to sound the uterus to a depth of {:16831}cm.  A {:16412} was used to sample the endometrium.  Sample was sent for pathologic  examination.  Condition: Stable  Complications: None  Plan:  The patient was advised to call for any fever or for prolonged or severe pain or bleeding. She was advised to use {meds; pain:16413} as needed for mild to moderate pain. She was advised to avoid vaginal intercourse for 48 hours or until the bleeding has completely stopped.  Attending Physician Documentation: {attending attestation:17855}    Assessment/Plan: 1. Abnormal uterine bleeding (AUB) - Pelvic GYN [place] completed - Surgical pathology - Pt will RTO to discuss endometrial biopsy results and discuss possible hysterectomy with Dr. Hyacinth Meeker.  [name]

## 2023-05-11 ENCOUNTER — Encounter (HOSPITAL_BASED_OUTPATIENT_CLINIC_OR_DEPARTMENT_OTHER): Payer: Self-pay | Admitting: Radiology

## 2023-05-11 ENCOUNTER — Ambulatory Visit (HOSPITAL_BASED_OUTPATIENT_CLINIC_OR_DEPARTMENT_OTHER)
Admission: RE | Admit: 2023-05-11 | Discharge: 2023-05-11 | Disposition: A | Discharge status: Home / Self Care | Payer: BC Managed Care – PPO | Source: Ambulatory Visit | Attending: Certified Nurse Midwife | Admitting: Certified Nurse Midwife

## 2023-05-11 DIAGNOSIS — Z1231 Encounter for screening mammogram for malignant neoplasm of breast: Secondary | ICD-10-CM | POA: Diagnosis present

## 2023-05-12 LAB — SURGICAL PATHOLOGY

## 2023-05-25 ENCOUNTER — Encounter (HOSPITAL_BASED_OUTPATIENT_CLINIC_OR_DEPARTMENT_OTHER): Payer: Self-pay | Admitting: Certified Nurse Midwife

## 2023-05-26 ENCOUNTER — Ambulatory Visit (INDEPENDENT_AMBULATORY_CARE_PROVIDER_SITE_OTHER): Payer: BC Managed Care – PPO | Admitting: *Deleted

## 2023-05-26 VITALS — BP 136/70 | HR 68 | Ht 63.0 in | Wt 209.6 lb

## 2023-05-26 DIAGNOSIS — R300 Dysuria: Secondary | ICD-10-CM

## 2023-05-26 LAB — POCT URINALYSIS DIPSTICK
Appearance: NORMAL
Bilirubin, UA: NEGATIVE
Blood, UA: NEGATIVE
Glucose, UA: NEGATIVE
Nitrite, UA: NEGATIVE
Protein, UA: NEGATIVE
Spec Grav, UA: 1.025 (ref 1.010–1.025)
Urobilinogen, UA: 0.2 U/dL
pH, UA: 6 (ref 5.0–8.0)

## 2023-05-26 MED ORDER — NITROFURANTOIN MONOHYD MACRO 100 MG PO CAPS: 100.0000 mg | ORAL_CAPSULE | Freq: Two times a day (BID) | ORAL | 0 refills | Status: AC

## 2023-05-26 NOTE — Progress Notes (Signed)
Pt presents with complaints of burning with urination and urgency. Pt instructed on and performed clean catch urine. Rx sent to pharmacy for treatment of symptoms. Advised pt that if symptoms do not improve over the weekend, she should call Monday and let us know so we could schedule appt for evaluation by provider. Pt verbalized understanding.

## 2023-05-30 LAB — URINE CULTURE

## 2023-06-07 ENCOUNTER — Encounter (HOSPITAL_BASED_OUTPATIENT_CLINIC_OR_DEPARTMENT_OTHER): Payer: Self-pay | Admitting: Obstetrics & Gynecology

## 2023-06-07 ENCOUNTER — Ambulatory Visit (INDEPENDENT_AMBULATORY_CARE_PROVIDER_SITE_OTHER): Payer: BC Managed Care – PPO | Admitting: Obstetrics & Gynecology

## 2023-06-07 DIAGNOSIS — N921 Excessive and frequent menstruation with irregular cycle: Secondary | ICD-10-CM | POA: Diagnosis not present

## 2023-06-07 DIAGNOSIS — Z862 Personal history of diseases of the blood and blood-forming organs and certain disorders involving the immune mechanism: Secondary | ICD-10-CM | POA: Diagnosis not present

## 2023-06-07 DIAGNOSIS — D251 Intramural leiomyoma of uterus: Secondary | ICD-10-CM | POA: Diagnosis not present

## 2023-06-07 DIAGNOSIS — N946 Dysmenorrhea, unspecified: Secondary | ICD-10-CM | POA: Diagnosis not present

## 2023-06-07 DIAGNOSIS — Z9884 Bariatric surgery status: Secondary | ICD-10-CM

## 2023-06-10 ENCOUNTER — Encounter (HOSPITAL_BASED_OUTPATIENT_CLINIC_OR_DEPARTMENT_OTHER): Payer: Self-pay | Admitting: Obstetrics & Gynecology

## 2023-06-10 NOTE — Progress Notes (Signed)
GYNECOLOGY  VISIT  CC:   discuss surgery  HPI: 42 y.o. [id number] Married White or Caucasian female here for discussion of hysterectomy.  She has been experiencing menorrhagia with irregular cycles as well as dysmenorrhea and pelvic pain.  She is absolutely sick and tired of this and ready for uterus to be moved.  Ultrasound done 05/10/2023 did show uterins fibroids.  Largest was about 1.7cm. Pap was normal 04/25/2023 and endometrial biopsy was negative for abnormal cells on 05/10/2023.    We discussed surgical procedure specifically TLH.  Hospital stay, recovery and pain management all discussed.  Risks discussed including but not limited to bleeding, 1% risk of receiving a  transfusion, infection, 3-4% risk of bowel/bladder/ureteral/vascular injury discussed as well as possible need for additional surgery if injury does occur discussed.  DVT/PE and rare risk of death discussed.  My actual complications with prior surgeries discussed.  Vaginal cuff dehiscence discussed.  Hernia formation discussed.  Positioning and incision locations discussed.  Patient aware if pathology abnormal she may need additional treatment.    We also discussed pelvic pain and possibility that this would not fully be relieved with surgery.  She may ultimately benefit from pelvic PT.  Pt is willing to do this if needed.  Feel reasonable to await until after surgery to decide if this is needed.      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   Depression    Diabetes mellitus without complication (HCC)    type II   Dizziness    Dysmenorrhea    Eczema    Elevated triglycerides with high cholesterol    Frequent headaches    GERD (gastroesophageal reflux disease)    H/O cold sores    High cholesterol    Hypertension    high blood pressure readings    Migraines     with aura   Obesity    PCOS (polycystic ovarian syndrome)    Seasonal allergies    Sleep apnea    Urine incontinence    UTI (urinary tract infection)     MEDS:   Current Outpatient Medications on File Prior to Visit  Medication Sig Dispense Refill   albuterol (VENTOLIN HFA) 108 (90 Base) MCG/ACT inhaler Inhale 2 puffs into the lungs every 6 (six) hours as needed for wheezing or shortness of breath. 18 g 3   Multiple Vitamins-Minerals (MULTIVITAMIN ADULT, MINERALS,) TABS Take daily for post bariatric supplementation. 90 tablet 3   valACYclovir (VALTREX) 1000 MG tablet TAKE 1 TABLET BY MOUTH EVERY DAY 90 tablet 3   Vitamin D, Ergocalciferol, (DRISDOL) 1.25 MG (50000 UNIT) CAPS capsule Take 1 capsule (50,000 Units total) by mouth every 7 (seven) days. 12 capsule 4   No current facility-administered medications on file prior to visit.    ALLERGIES: Ceclor [cefaclor], Nsaids, and Pediapred [prednisolone sodium phosphate]  SH:  married, non smoker  Review of Systems  Constitutional: Negative.   Genitourinary:        Pelvic pain, dysmenorrhea, heavy bleeding    PHYSICAL EXAMINATION:    LMP 05/07/2023 (Approximate)     General appearance: alert, cooperative and appears stated age normal to inspection and palpation CV:  Regular rate and rhythm Lungs:  clear to auscultation, no wheezes, rales or rhonchi, symmetric air entry Lymph:  no inguinal LAD noted  Pelvic: External genitalia:  no lesions              Urethra:  normal appearing urethra with no masses, tenderness or lesions              Bartholins and Skenes: normal                 Vagina: normal mucosa without prolapse or lesions              Cervix: no lesions              Bimanual Exam:  Uterus:  normal size, contour, position, consistency, mobility, non-tender              Adnexa: no mass, fullness, tenderness              Chaperone, Raechel Ache, RN, was present for exam.  Assessment/Plan: 1. Menorrhagia with  irregular cycle - referral for surgery placed  2. Intramural leiomyoma of uterus  3. History of anemia - most recent hb was 12.0  4. Dysmenorrhea  5. S/P gastric bypass  Total time with parent:  28 minutes Documentation time: 6 minutes Total time:  34 minutes

## 2023-06-12 ENCOUNTER — Telehealth: Payer: Self-pay

## 2023-06-12 NOTE — Telephone Encounter (Signed)
Called patient to schedule surgery w/ Dr. Hyacinth Meeker. Patient is available on 07/25/23. Let the patient know centralized scheduling is closed and I will have her officially scheduled tomorrow. Provided pre-op instructions over the phone. Patient will wait for surgery details to be sent to her Mychart.

## 2023-06-14 ENCOUNTER — Encounter (HOSPITAL_BASED_OUTPATIENT_CLINIC_OR_DEPARTMENT_OTHER): Payer: Self-pay

## 2023-07-12 ENCOUNTER — Encounter (HOSPITAL_BASED_OUTPATIENT_CLINIC_OR_DEPARTMENT_OTHER): Payer: Self-pay | Admitting: Obstetrics & Gynecology

## 2023-07-17 ENCOUNTER — Encounter: Payer: Self-pay | Admitting: Family Medicine

## 2023-07-17 ENCOUNTER — Ambulatory Visit: Payer: BC Managed Care – PPO | Admitting: Family Medicine

## 2023-07-17 ENCOUNTER — Other Ambulatory Visit (HOSPITAL_BASED_OUTPATIENT_CLINIC_OR_DEPARTMENT_OTHER): Payer: Self-pay | Admitting: Obstetrics & Gynecology

## 2023-07-17 ENCOUNTER — Encounter (HOSPITAL_BASED_OUTPATIENT_CLINIC_OR_DEPARTMENT_OTHER): Payer: Self-pay | Admitting: Obstetrics & Gynecology

## 2023-07-17 VITALS — BP 124/83 | HR 65 | Temp 98.1°F | Ht 63.0 in | Wt 211.0 lb

## 2023-07-17 DIAGNOSIS — E88819 Insulin resistance, unspecified: Secondary | ICD-10-CM | POA: Diagnosis not present

## 2023-07-17 DIAGNOSIS — E282 Polycystic ovarian syndrome: Secondary | ICD-10-CM

## 2023-07-17 DIAGNOSIS — E611 Iron deficiency: Secondary | ICD-10-CM

## 2023-07-17 DIAGNOSIS — J309 Allergic rhinitis, unspecified: Secondary | ICD-10-CM

## 2023-07-17 DIAGNOSIS — R7989 Other specified abnormal findings of blood chemistry: Secondary | ICD-10-CM | POA: Diagnosis not present

## 2023-07-17 DIAGNOSIS — Z9884 Bariatric surgery status: Secondary | ICD-10-CM

## 2023-07-17 LAB — IBC + FERRITIN
Ferritin: 3.2 ng/mL — ABNORMAL LOW (ref 10.0–291.0)
Iron: 15 ug/dL — ABNORMAL LOW (ref 42–145)
Saturation Ratios: 2.6 % — ABNORMAL LOW (ref 20.0–50.0)
TIBC: 568.4 ug/dL — ABNORMAL HIGH (ref 250.0–450.0)
Transferrin: 406 mg/dL — ABNORMAL HIGH (ref 212.0–360.0)

## 2023-07-17 LAB — CBC
HCT: 33.8 % — ABNORMAL LOW (ref 36.0–46.0)
Hemoglobin: 10.7 g/dL — ABNORMAL LOW (ref 12.0–15.0)
MCHC: 31.8 g/dL (ref 30.0–36.0)
MCV: 73.7 fL — ABNORMAL LOW (ref 78.0–100.0)
Platelets: 283 10*3/uL (ref 150.0–400.0)
RBC: 4.59 Mil/uL (ref 3.87–5.11)
RDW: 14.5 % (ref 11.5–15.5)
WBC: 7.2 10*3/uL (ref 4.0–10.5)

## 2023-07-17 LAB — COMPREHENSIVE METABOLIC PANEL
ALT: 21 U/L (ref 0–35)
AST: 18 U/L (ref 0–37)
Albumin: 4.4 g/dL (ref 3.5–5.2)
Alkaline Phosphatase: 65 U/L (ref 39–117)
BUN: 22 mg/dL (ref 6–23)
CO2: 25 meq/L (ref 19–32)
Calcium: 8.8 mg/dL (ref 8.4–10.5)
Chloride: 107 meq/L (ref 96–112)
Creatinine, Ser: 0.58 mg/dL (ref 0.40–1.20)
GFR: 112.63 mL/min (ref >60.00)
Glucose, Bld: 79 mg/dL (ref 70–99)
Potassium: 3.8 meq/L (ref 3.5–5.1)
Sodium: 138 meq/L (ref 135–145)
Total Bilirubin: 0.2 mg/dL (ref 0.2–1.2)
Total Protein: 7 g/dL (ref 6.0–8.3)

## 2023-07-17 LAB — POCT GLYCOSYLATED HEMOGLOBIN (HGB A1C): Hemoglobin A1C: 5.3 % (ref 4.0–5.6)

## 2023-07-17 LAB — VITAMIN B12: Vitamin B-12: 543 pg/mL (ref 211–911)

## 2023-07-17 LAB — TSH: TSH: 2.63 u[IU]/mL (ref 0.35–5.50)

## 2023-07-17 LAB — VITAMIN D 25 HYDROXY (VIT D DEFICIENCY, FRACTURES): VITD: 24.06 ng/mL — ABNORMAL LOW (ref 30.00–100.00)

## 2023-07-17 MED ORDER — LEVOCETIRIZINE DIHYDROCHLORIDE 5 MG PO TABS: 5.0000 mg | ORAL_TABLET | Freq: Every evening | ORAL | 3 refills | Status: DC

## 2023-07-17 MED ORDER — ALBUTEROL SULFATE HFA 108 (90 BASE) MCG/ACT IN AERS: 2.0000 | INHALATION_SPRAY | Freq: Four times a day (QID) | RESPIRATORY_TRACT | 3 refills | Status: AC | PRN

## 2023-07-17 MED ORDER — SPIRONOLACTONE 25 MG PO TABS: 25.0000 mg | ORAL_TABLET | Freq: Every day | ORAL | 3 refills | Status: DC

## 2023-07-17 NOTE — Assessment & Plan Note (Signed)
 Still having quite a bit of dysmenorrhea and will be undergoing hysterectomy next week for her uterine fibroids.  She is having a fair amount of lower extremity edema as well.  Will restart her spironolactone .  She previously did well with this.  Will check labs today.  She will follow-up with us  in a few weeks to let us  know how she is doing with the spironolactone  and after her surgery.

## 2023-07-17 NOTE — Assessment & Plan Note (Signed)
 Recheck vitamin D

## 2023-07-17 NOTE — Progress Notes (Signed)
 Debra Miller is a 43 y.o. female who presents today for an office visit.  Assessment/Plan:  Chronic Problems Addressed Today: Iron  deficiency Recheck iron  panel and CBC today.  She is still having quite a bit of fatigue.  Discussed with patient we need to address her anemia first before looking for other causes of her fatigue.  She is agreeable to this.  She is currently taking iron  supplementation.  PCOS (polycystic ovarian syndrome) Still having quite a bit of dysmenorrhea and will be undergoing hysterectomy next week for her uterine fibroids.  She is having a fair amount of lower extremity edema as well.  Will restart her spironolactone .  She previously did well with this.  Will check labs today.  She will follow-up with us  in a few weeks to let us  know how she is doing with the spironolactone  and after her surgery.  Low vitamin D  level Recheck vitamin D .  Insulin  resistance A1c at goal without meds.  S/P gastric bypass Likely contributing to her iron  deficiency.  Will recheck iron  panel panel and other labs today.  Allergic rhinitis Symptoms are not controlled.  She does have some left-sided eustachian tube dysfunction based on today's exam as well.  She did not do well with Singulair  or Astelin  in the past as she thought they were ineffective.  Will start Xyzal  and refer her to ENT.     Subjective:  HPI:  See Assessment / plan for status of chronic conditions. Patient here today for follow up.  Last saw her 3 months ago.  Her primary concern at that visit was ongoing fatigue and diffuse bodyaches.  Recheck labs at that time which showed iron  deficiency anemia.  We recommended she started iron  supplementation with vitamin C.  Also recommended gynecology follow-up due to dysmenorrhea.  We also recommended that she start vitamin D  supplementation.  She establish care with gynecology.  Had an ultrasound which demonstrated fibroids.  She will be undergoing total laparoscopic  hysterectomy later this month. They are also concerned about endometrosis.    Overall feels okay today.  Still having a lot of ongoing fatigue.  She has also had more swelling in her legs and feet.  This has been going on for several months though seems to be worsening the last few weeks.  She was previously on spironolactone  for this a couple of years ago however discontinued about a year or so ago.  Symptoms are worse at the end of the day.  Improved with propping her legs up.  No treatments tried.  She is also having a lot of discomfort in her left ear.  This is also been ongoing issue for many years.  She had multiple ear infections as a child and then having tubes placed as a child as well.  She has tried over-the-counter allergy medications and nasal spray without much improvement.       Objective:  Physical Exam: BP 124/83   Pulse 65   Temp 98.1 F (36.7 C) (Temporal)   Ht 5' 3 (1.6 m)   Wt 211 lb (95.7 kg)   SpO2 98%   BMI 37.38 kg/m   Gen: No acute distress, resting comfortably HEENT: Left TM with clear effusion.  Right TM with otosclerosis.  No effusion noted. CV: Regular rate and rhythm with no murmurs appreciated Pulm: Normal work of breathing, clear to auscultation bilaterally with no crackles, wheezes, or rhonchi Neuro: Grossly normal, moves all extremities Psych: Normal affect and thought content  Worth HERO. Kennyth, MD 07/17/2023 12:40 PM

## 2023-07-17 NOTE — Assessment & Plan Note (Signed)
 Likely contributing to her iron deficiency.  Will recheck iron panel panel and other labs today.

## 2023-07-17 NOTE — Progress Notes (Deleted)
   Debra Miller is a 43 y.o. female who presents today for an office visit.  Assessment/Plan:  New/Acute Problems: ***  Chronic Problems Addressed Today: No problem-specific Assessment & Plan notes found for this encounter.     Subjective:  HPI:  See Assessment / plan for status of chronic conditions. Patient here today for follow up.  Last saw her 3 months ago.  Her primary concern at that visit was ongoing fatigue and diffuse bodyaches.  Recheck labs at that time which showed iron  deficiency anemia.  We recommended she started iron  supplementation with vitamin C.  Also recommended gynecology follow-up due to dysmenorrhea.  We also recommended that she start vitamin D  supplementation.  She establish care with gynecology.  Had an ultrasound which demonstrated fibroids.  She will be undergoing total laparoscopic hysterectomy later this month.       Objective:  Physical Exam: There were no vitals taken for this visit.  Gen: No acute distress, resting comfortably*** CV: Regular rate and rhythm with no murmurs appreciated Pulm: Normal work of breathing, clear to auscultation bilaterally with no crackles, wheezes, or rhonchi Neuro: Grossly normal, moves all extremities Psych: Normal affect and thought content      Quintavious Rinck M. Kennyth, MD 07/17/2023 8:26 AM

## 2023-07-17 NOTE — Patient Instructions (Addendum)
 It was very nice to see you today!  We will recheck blood work today.   Please restart the fluid pill for the swelling in your legs.  We will also start Xyzal  and refer you to see the ear nose and throat doctor for your congestion and ear issues.  Let us  know how you are doing in a week or 2.  Return in about 6 months (around 01/14/2024) for Follow Up.   Take care, Dr Kennyth  PLEASE NOTE:  If you had any lab tests, please let us  know if you have not heard back within a few days. You may see your results on mychart before we have a chance to review them but we will give you a call once they are reviewed by us .   If we ordered any referrals today, please let us  know if you have not heard from their office within the next week.   If you had any urgent prescriptions sent in today, please check with the pharmacy within an hour of our visit to make sure the prescription was transmitted appropriately.   Please try these tips to maintain a healthy lifestyle:  Eat at least 3 REAL meals and 1-2 snacks per day.  Aim for no more than 5 hours between eating.  If you eat breakfast, please do so within one hour of getting up.   Each meal should contain half fruits/vegetables, one quarter protein, and one quarter carbs (no bigger than a computer mouse)  Cut down on sweet beverages. This includes juice, soda, and sweet tea.   Drink at least 1 glass of water  with each meal and aim for at least 8 glasses per day  Exercise at least 150 minutes every week.

## 2023-07-17 NOTE — Assessment & Plan Note (Signed)
 Recheck iron  panel and CBC today.  She is still having quite a bit of fatigue.  Discussed with patient we need to address her anemia first before looking for other causes of her fatigue.  She is agreeable to this.  She is currently taking iron  supplementation.

## 2023-07-17 NOTE — Assessment & Plan Note (Signed)
 A1c at goal without meds.

## 2023-07-17 NOTE — Progress Notes (Signed)
 Spoke w/ via phone for pre-op interview--- pt Lab needs dos---- urine preg        Lab results------ lab appt 07-18-2023 @ 1000 getting CBC/ BMP/ T&S/ EKG COVID test -----patient states asymptomatic no test needed Arrive at ------- 1130 on 07-25-2023 NPO after MN NO Solid Food.  Clear liquids from MN until--- 1030 Med rec completed Medications to take morning of surgery ----- none Diabetic medication ----- n/a Patient instructed no nail polish to be worn day of surgery Patient instructed to bring photo id and insurance card day of surgery Patient aware to have Driver (ride ) / caregiver    for 24 hours after surgery - husband, Debra Miller Patient Special Instructions ----- will pick up bag w/ hibiclens  and written instructions at lab appt.  Asked to bring rescue inhaler dos and call with any questions. Pre-Op special Instructions -----  sent inbox message in epic to dr m. Cleotilde , requested orders on 07-12-2023 Patient verbalized understanding of instructions that were given at this phone interview. Patient denies chest pain, sob, fever, cough at the interview.

## 2023-07-17 NOTE — Progress Notes (Signed)
 Your procedure is scheduled on  :  Tuesday,  07-25-2023  Report to [facility] AT  _11:30__ AM.   Call this number if you have problems the morning of surgery  :[phone]. Any questions prior to surgery call pre-op nurse,  Lateefah Mallery :  [phone]   OUR ADDRESS IS [address].  WE ARE LOCATED IN [facility]  PLEASE BRING YOUR INSURANCE CARD AND PHOTO ID DAY OF SURGERY.                                     REMEMBER: Do not eat food after midnight night before surgery.  You may have clear liquids from midnight night before surgery until 10:30 AM.  NO clear liquids after 10:30 AM.  This includes no water ,  candy,  gum,  and mints.   Please brush your teeth morning of surgery and rinse mouth out.   CLEAR LIQUID DIET Allowed      Water                                                                    Coffee and tea, regular and decaf  (NO cream or milk products of any type, may sweeten,  no honey)                                                         Sports drinks like Gatorade _____________________________________________________________________     TAKE ONLY THESE MEDICATIONS MORNING OF SURGERY:  None  Please bring your Albuterol  (ventolin ) inhaler with you day of surgery                                       DO NOT WEAR JEWERLY/  METAL/  PIERCINGS (INCLUDING NO PLASTIC PIERCINGS) DO NOT WEAR LOTIONS, POWDERS, PERFUMES OR NAIL POLISH ON YOUR FINGERNAILS. TOENAIL POLISH IS OK TO WEAR. DO NOT SHAVE FOR 48 HOURS PRIOR TO DAY OF SURGERY.  CONTACTS, GLASSES, OR DENTURES MAY NOT BE WORN TO SURGERY.  REMEMBER: NO SMOKING, VAPING ,  DRUGS OR ALCOHOL FOR 24 HOURS BEFORE YOUR SURGERY.                                    [facility] IS NOT RESPONSIBLE  FOR ANY BELONGINGS.                                                                    SABRA           [facility] - Preparing for Surgery Before surgery, you can play  an important  role.  Because skin is not sterile, your skin needs to be as free of germs as possible.  You can reduce the number of germs on your skin by washing with CHG (chlorahexidine gluconate) soap before surgery.  CHG is an antiseptic cleaner which kills germs and bonds with the skin to continue killing germs even after washing. Please DO NOT use if you have an allergy to CHG or antibacterial soaps.  If your skin becomes reddened/irritated stop using the CHG and inform your nurse when you arrive at Short Stay. Do not shave (including legs and underarms) for at least 48 hours prior to the first CHG shower.  You may shave your face/neck. Please follow these instructions carefully:  1.  Shower with CHG Soap the night before surgery and the  morning of Surgery.  2.  If you choose to wash your hair, wash your hair first as usual with your  normal  shampoo.  3.  After you shampoo, rinse your hair and body thoroughly to remove the  shampoo.                                        4.  Use CHG as you would any other liquid soap.  You can apply chg directly  to the skin and wash , chg soap provided, night before and morning of your surgery.  5.  Apply the CHG Soap to your body ONLY FROM THE NECK DOWN.   Do not use on face/ open                           Wound or open sores. Avoid contact with eyes, ears mouth and genitals (private parts).                       Wash face,  Genitals (private parts) with your normal soap.             6.  Wash thoroughly, paying special attention to the area where your surgery  will be performed.  7.  Thoroughly rinse your body with warm water  from the neck down.  8.  DO NOT shower/wash with your normal soap after using and rinsing off  the CHG Soap.             9.  Pat yourself dry with a clean towel.            10.  Wear clean pajamas.            11.  Place clean sheets on your bed the night of your first shower and do not  sleep with pets. Day of Surgery : Do not apply any lotions/  powders the morning of surgery.  Please wear clean clothes to the hospital/surgery center.  IF YOU HAVE ANY SKIN IRRITATION OR PROBLEMS WITH THE SURGICAL SOAP, PLEASE GET A BAR OF GOLD DIAL SOAP AND SHOWER THE NIGHT BEFORE YOUR SURGERY AND THE MORNING OF YOUR SURGERY. PLEASE LET THE NURSE KNOW MORNING OF YOUR SURGERY IF YOU HAD ANY PROBLEMS WITH THE SURGICAL SOAP.   YOUR SURGEON MAY HAVE REQUESTED EXTENDED RECOVERY TIME AFTER YOUR SURGERY. IT COULD BE A  JUST A FEW HOURS  UP TO AN OVERNIGHT STAY.  YOUR SURGEON SHOULD HAVE DISCUSSED THIS WITH YOU PRIOR TO YOUR SURGERY. IN THE EVENT  YOU NEED TO STAY OVERNIGHT PLEASE REFER TO THE FOLLOWING GUIDELINES. YOU MAY HAVE UP TO 4 VISITORS  MAY VISIT IN THE EXTENDED RECOVERY ROOM UNTIL 800 PM ONLY.  ONE  VISITOR AGE 45 AND OVER MAY SPEND THE NIGHT AND MUST BE IN EXTENDED RECOVERY ROOM NO LATER THAN 800 PM . YOUR DISCHARGE TIME AFTER YOU SPEND THE NIGHT IS 900 AM THE MORNING AFTER YOUR SURGERY. YOU MAY PACK A SMALL OVERNIGHT BAG WITH TOILETRIES FOR YOUR OVERNIGHT STAY IF YOU WISH.  REGARDLESS OF IF YOU STAY OVER NIGHT OR ARE DISCHARGED THE SAME DAY YOU WILL BE REQUIRED TO HAVE A RESPONSIBLE ADULT (18 YRS OLD OR OLDER) STAY WITH YOU FOR AT LEAST THE FIRST 58 HOURS WHEN HOME.  YOUR PRESCRIPTION MEDICATIONS WILL BE PROVIDED DURING [facility] STAY.  ________________________________________________________________________

## 2023-07-17 NOTE — Assessment & Plan Note (Signed)
 Symptoms are not controlled.  She does have some left-sided eustachian tube dysfunction based on today's exam as well.  She did not do well with Singulair or Astelin in the past as she thought they were ineffective.  Will start Xyzal and refer her to ENT.

## 2023-07-18 ENCOUNTER — Encounter (HOSPITAL_COMMUNITY)
Admission: RE | Admit: 2023-07-18 | Discharge: 2023-07-18 | Disposition: A | Discharge status: Home / Self Care | Payer: BC Managed Care – PPO | Source: Ambulatory Visit | Attending: Obstetrics & Gynecology | Admitting: Obstetrics & Gynecology

## 2023-07-18 ENCOUNTER — Other Ambulatory Visit (HOSPITAL_BASED_OUTPATIENT_CLINIC_OR_DEPARTMENT_OTHER): Payer: Self-pay | Admitting: Obstetrics & Gynecology

## 2023-07-18 DIAGNOSIS — Z01818 Encounter for other preprocedural examination: Secondary | ICD-10-CM

## 2023-07-18 LAB — BASIC METABOLIC PANEL
Anion gap: 9 (ref 5–15)
BUN: 24 mg/dL — ABNORMAL HIGH (ref 6–20)
CO2: 23 mmol/L (ref 22–32)
Calcium: 8.3 mg/dL — ABNORMAL LOW (ref 8.9–10.3)
Chloride: 106 mmol/L (ref 98–111)
Creatinine, Ser: 0.47 mg/dL (ref 0.44–1.00)
GFR, Estimated: >60 mL/min (ref >60)
Glucose, Bld: 88 mg/dL (ref 70–99)
Potassium: 3.7 mmol/L (ref 3.5–5.1)
Sodium: 138 mmol/L (ref 135–145)

## 2023-07-18 LAB — CBC
HCT: 34.3 % — ABNORMAL LOW (ref 36.0–46.0)
Hemoglobin: 10.3 g/dL — ABNORMAL LOW (ref 12.0–15.0)
MCH: 23.3 pg — ABNORMAL LOW (ref 26.0–34.0)
MCHC: 30 g/dL (ref 30.0–36.0)
MCV: 77.6 fL — ABNORMAL LOW (ref 80.0–100.0)
Platelets: 253 10*3/uL (ref 150–400)
RBC: 4.42 MIL/uL (ref 3.87–5.11)
RDW: 14 % (ref 11.5–15.5)
WBC: 5.8 10*3/uL (ref 4.0–10.5)
nRBC: 0 % (ref 0.0–0.2)

## 2023-07-18 NOTE — Progress Notes (Signed)
 Her iron  counts are still very low.  She should continue iron  supplementation.  Hopefully this will improve after her hysterectomy.  We can recheck in 3 to 6 months.  Vitamin D  is stable.  Recommend she continue 5000 IUs daily and we can also recheck this in 3 to 6 months.  The rest of her labs are all normal.

## 2023-07-25 ENCOUNTER — Encounter (HOSPITAL_BASED_OUTPATIENT_CLINIC_OR_DEPARTMENT_OTHER): Payer: Self-pay | Admitting: Obstetrics & Gynecology

## 2023-07-25 ENCOUNTER — Ambulatory Visit (HOSPITAL_BASED_OUTPATIENT_CLINIC_OR_DEPARTMENT_OTHER): Payer: BC Managed Care – PPO | Admitting: Anesthesiology

## 2023-07-25 ENCOUNTER — Other Ambulatory Visit (HOSPITAL_BASED_OUTPATIENT_CLINIC_OR_DEPARTMENT_OTHER): Payer: Self-pay | Admitting: Obstetrics & Gynecology

## 2023-07-25 ENCOUNTER — Encounter (HOSPITAL_BASED_OUTPATIENT_CLINIC_OR_DEPARTMENT_OTHER)
Admission: RE | Disposition: A | Discharge status: Home / Self Care | Payer: Self-pay | Source: Home / Self Care | Attending: Obstetrics & Gynecology

## 2023-07-25 ENCOUNTER — Other Ambulatory Visit: Payer: Self-pay

## 2023-07-25 ENCOUNTER — Ambulatory Visit (HOSPITAL_BASED_OUTPATIENT_CLINIC_OR_DEPARTMENT_OTHER)
Admission: RE | Admit: 2023-07-25 | Discharge: 2023-07-25 | Disposition: A | Discharge status: Home / Self Care | Payer: BC Managed Care – PPO | Attending: Obstetrics & Gynecology | Admitting: Obstetrics & Gynecology

## 2023-07-25 DIAGNOSIS — J4520 Mild intermittent asthma, uncomplicated: Secondary | ICD-10-CM | POA: Insufficient documentation

## 2023-07-25 DIAGNOSIS — R102 Pelvic and perineal pain: Secondary | ICD-10-CM | POA: Diagnosis not present

## 2023-07-25 DIAGNOSIS — Z8249 Family history of ischemic heart disease and other diseases of the circulatory system: Secondary | ICD-10-CM | POA: Diagnosis not present

## 2023-07-25 DIAGNOSIS — D252 Subserosal leiomyoma of uterus: Secondary | ICD-10-CM | POA: Diagnosis not present

## 2023-07-25 DIAGNOSIS — Z833 Family history of diabetes mellitus: Secondary | ICD-10-CM | POA: Insufficient documentation

## 2023-07-25 DIAGNOSIS — Z01818 Encounter for other preprocedural examination: Secondary | ICD-10-CM

## 2023-07-25 DIAGNOSIS — K9089 Other intestinal malabsorption: Secondary | ICD-10-CM

## 2023-07-25 DIAGNOSIS — N921 Excessive and frequent menstruation with irregular cycle: Secondary | ICD-10-CM | POA: Insufficient documentation

## 2023-07-25 DIAGNOSIS — F411 Generalized anxiety disorder: Secondary | ICD-10-CM | POA: Insufficient documentation

## 2023-07-25 DIAGNOSIS — N8003 Adenomyosis of the uterus: Secondary | ICD-10-CM | POA: Insufficient documentation

## 2023-07-25 DIAGNOSIS — Z6837 Body mass index (BMI) 37.0-37.9, adult: Secondary | ICD-10-CM | POA: Insufficient documentation

## 2023-07-25 DIAGNOSIS — D259 Leiomyoma of uterus, unspecified: Secondary | ICD-10-CM | POA: Diagnosis present

## 2023-07-25 DIAGNOSIS — N920 Excessive and frequent menstruation with regular cycle: Secondary | ICD-10-CM | POA: Diagnosis present

## 2023-07-25 DIAGNOSIS — D509 Iron deficiency anemia, unspecified: Secondary | ICD-10-CM | POA: Insufficient documentation

## 2023-07-25 DIAGNOSIS — D251 Intramural leiomyoma of uterus: Secondary | ICD-10-CM | POA: Diagnosis not present

## 2023-07-25 DIAGNOSIS — E66813 Obesity, class 3: Secondary | ICD-10-CM | POA: Insufficient documentation

## 2023-07-25 DIAGNOSIS — G4733 Obstructive sleep apnea (adult) (pediatric): Secondary | ICD-10-CM | POA: Diagnosis not present

## 2023-07-25 DIAGNOSIS — D500 Iron deficiency anemia secondary to blood loss (chronic): Secondary | ICD-10-CM | POA: Diagnosis not present

## 2023-07-25 DIAGNOSIS — F319 Bipolar disorder, unspecified: Secondary | ICD-10-CM | POA: Diagnosis not present

## 2023-07-25 DIAGNOSIS — I10 Essential (primary) hypertension: Secondary | ICD-10-CM | POA: Insufficient documentation

## 2023-07-25 DIAGNOSIS — K909 Intestinal malabsorption, unspecified: Secondary | ICD-10-CM

## 2023-07-25 HISTORY — DX: Leiomyoma of uterus, unspecified: D25.9

## 2023-07-25 HISTORY — DX: Personal history of other endocrine, nutritional and metabolic disease: Z86.39

## 2023-07-25 HISTORY — PX: CYSTOSCOPY: SHX5120

## 2023-07-25 HISTORY — DX: Mild intermittent asthma, uncomplicated: J45.20

## 2023-07-25 HISTORY — DX: Presence of spectacles and contact lenses: Z97.3

## 2023-07-25 HISTORY — DX: Excessive and frequent menstruation with irregular cycle: N92.1

## 2023-07-25 HISTORY — PX: TOTAL LAPAROSCOPIC HYSTERECTOMY WITH SALPINGECTOMY: SHX6742

## 2023-07-25 HISTORY — DX: Dry eye syndrome of bilateral lacrimal glands: H04.123

## 2023-07-25 HISTORY — DX: Iron deficiency anemia, unspecified: D50.9

## 2023-07-25 HISTORY — DX: Allergic rhinitis, unspecified: J30.9

## 2023-07-25 HISTORY — DX: Mixed hyperlipidemia: E78.2

## 2023-07-25 HISTORY — DX: Generalized anxiety disorder: F41.1

## 2023-07-25 HISTORY — DX: Major depressive disorder, single episode, unspecified: F32.9

## 2023-07-25 HISTORY — DX: Obstructive sleep apnea (adult) (pediatric): G47.33

## 2023-07-25 LAB — POCT PREGNANCY, URINE: Preg Test, Ur: NEGATIVE

## 2023-07-25 LAB — TYPE AND SCREEN
ABO/RH(D): O NEG
Antibody Screen: NEGATIVE

## 2023-07-25 LAB — GLUCOSE, CAPILLARY: Glucose-Capillary: 77 mg/dL (ref 70–99)

## 2023-07-25 SURGERY — HYSTERECTOMY, TOTAL, LAPAROSCOPIC, WITH SALPINGECTOMY
Anesthesia: General | Site: Bladder

## 2023-07-25 MED ORDER — MEPERIDINE HCL 25 MG/ML IJ SOLN: 6.2500 mg | INTRAMUSCULAR | Status: DC | PRN

## 2023-07-25 MED ORDER — LIDOCAINE HCL (CARDIAC) PF 100 MG/5ML IV SOSY
PREFILLED_SYRINGE | INTRAVENOUS | Status: DC | PRN
  Administered 2023-07-25: 60 mg via INTRAVENOUS

## 2023-07-25 MED ORDER — DEXTROSE-SODIUM CHLORIDE 5-0.45 % IV SOLN: INTRAVENOUS | Status: DC

## 2023-07-25 MED ORDER — PANTOPRAZOLE SODIUM 40 MG IV SOLR: 40.0000 mg | Freq: Every day | INTRAVENOUS | Status: DC

## 2023-07-25 MED ORDER — ACETAMINOPHEN 500 MG PO TABS
1000.0000 mg | ORAL_TABLET | ORAL | Status: AC
  Administered 2023-07-25: 1000 mg via ORAL

## 2023-07-25 MED ORDER — SIMETHICONE 80 MG PO CHEW: 80.0000 mg | CHEWABLE_TABLET | Freq: Four times a day (QID) | ORAL | Status: DC | PRN

## 2023-07-25 MED ORDER — GABAPENTIN 100 MG PO CAPS
100.0000 mg | ORAL_CAPSULE | ORAL | Status: AC
  Administered 2023-07-25: 100 mg via ORAL

## 2023-07-25 MED ORDER — SODIUM CHLORIDE 0.9 % IR SOLN
Status: DC | PRN
  Administered 2023-07-25: 150 mL

## 2023-07-25 MED ORDER — SODIUM CHLORIDE 0.9 % IV SOLN
INTRAVENOUS | Status: DC | PRN
  Administered 2023-07-25: 60 mL

## 2023-07-25 MED ORDER — OXYCODONE-ACETAMINOPHEN 5-325 MG PO TABS
1.0000 | ORAL_TABLET | ORAL | Status: DC | PRN
  Administered 2023-07-25: 1 via ORAL

## 2023-07-25 MED ORDER — KETOROLAC TROMETHAMINE 30 MG/ML IJ SOLN
INTRAMUSCULAR | Status: DC | PRN
  Administered 2023-07-25: 30 mg via INTRAVENOUS

## 2023-07-25 MED ORDER — ONDANSETRON HCL 4 MG PO TABS: 4.0000 mg | ORAL_TABLET | Freq: Four times a day (QID) | ORAL | 0 refills | Status: DC | PRN

## 2023-07-25 MED ORDER — SUGAMMADEX SODIUM 200 MG/2ML IV SOLN
INTRAVENOUS | Status: DC | PRN
  Administered 2023-07-25: 200 mg via INTRAVENOUS

## 2023-07-25 MED ORDER — GENTAMICIN SULFATE 40 MG/ML IJ SOLN
5.0000 mg/kg | INTRAVENOUS | Status: AC
  Administered 2023-07-25: 348.4 mg via INTRAVENOUS
  Filled 2023-07-25: qty 8.75

## 2023-07-25 MED ORDER — OXYCODONE-ACETAMINOPHEN 5-325 MG PO TABS
ORAL_TABLET | ORAL | Status: AC
  Filled 2023-07-25: qty 1

## 2023-07-25 MED ORDER — OXYCODONE HCL 5 MG PO TABS: 5.0000 mg | ORAL_TABLET | Freq: Once | ORAL | Status: DC | PRN

## 2023-07-25 MED ORDER — ACETAMINOPHEN 160 MG/5ML PO SOLN: 325.0000 mg | ORAL | Status: DC | PRN

## 2023-07-25 MED ORDER — ACETAMINOPHEN 500 MG PO TABS
ORAL_TABLET | ORAL | Status: AC
  Filled 2023-07-25: qty 2

## 2023-07-25 MED ORDER — DEXAMETHASONE SODIUM PHOSPHATE 10 MG/ML IJ SOLN
INTRAMUSCULAR | Status: AC
Start: 2023-07-25 — End: ?
  Filled 2023-07-25: qty 1

## 2023-07-25 MED ORDER — SODIUM CHLORIDE 0.9 % IR SOLN
Status: DC | PRN
  Administered 2023-07-25: 300 mL

## 2023-07-25 MED ORDER — MIDAZOLAM HCL 2 MG/2ML IJ SOLN
INTRAMUSCULAR | Status: AC
Start: 2023-07-25 — End: ?
  Filled 2023-07-25: qty 2

## 2023-07-25 MED ORDER — FENTANYL CITRATE (PF) 100 MCG/2ML IJ SOLN
INTRAMUSCULAR | Status: AC
  Filled 2023-07-25: qty 2

## 2023-07-25 MED ORDER — STERILE WATER FOR IRRIGATION IR SOLN
Status: DC | PRN
  Administered 2023-07-25: 500 mL

## 2023-07-25 MED ORDER — METRONIDAZOLE 500 MG/100ML IV SOLN
INTRAVENOUS | Status: AC
Start: 2023-07-25 — End: ?
  Filled 2023-07-25: qty 100

## 2023-07-25 MED ORDER — ONDANSETRON HCL 4 MG/2ML IJ SOLN: 4.0000 mg | Freq: Once | INTRAMUSCULAR | Status: DC | PRN

## 2023-07-25 MED ORDER — ACETAMINOPHEN 325 MG PO TABS: 325.0000 mg | ORAL_TABLET | ORAL | Status: DC | PRN

## 2023-07-25 MED ORDER — FENTANYL CITRATE (PF) 100 MCG/2ML IJ SOLN
INTRAMUSCULAR | Status: DC | PRN
  Administered 2023-07-25 (×3): 50 ug via INTRAVENOUS
  Administered 2023-07-25: 100 ug via INTRAVENOUS
  Administered 2023-07-25 (×2): 50 ug via INTRAVENOUS

## 2023-07-25 MED ORDER — GABAPENTIN 300 MG PO CAPS: 300.0000 mg | ORAL_CAPSULE | Freq: Three times a day (TID) | ORAL | Status: DC | PRN

## 2023-07-25 MED ORDER — ONDANSETRON HCL 4 MG/2ML IJ SOLN
INTRAMUSCULAR | Status: DC | PRN
  Administered 2023-07-25: 4 mg via INTRAVENOUS

## 2023-07-25 MED ORDER — LIDOCAINE HCL (PF) 2 % IJ SOLN
INTRAMUSCULAR | Status: AC
  Filled 2023-07-25: qty 5

## 2023-07-25 MED ORDER — ROCURONIUM BROMIDE 100 MG/10ML IV SOLN
INTRAVENOUS | Status: DC | PRN
  Administered 2023-07-25: 20 mg via INTRAVENOUS
  Administered 2023-07-25: 60 mg via INTRAVENOUS
  Administered 2023-07-25: 20 mg via INTRAVENOUS

## 2023-07-25 MED ORDER — MENTHOL 3 MG MT LOZG: 1.0000 | LOZENGE | OROMUCOSAL | Status: DC | PRN

## 2023-07-25 MED ORDER — BUPIVACAINE HCL (PF) 0.25 % IJ SOLN
INTRAMUSCULAR | Status: DC | PRN
  Administered 2023-07-25: 16 mL

## 2023-07-25 MED ORDER — OXYCODONE-ACETAMINOPHEN 5-325 MG PO TABS: 1.0000 | ORAL_TABLET | Freq: Four times a day (QID) | ORAL | 0 refills | Status: DC | PRN

## 2023-07-25 MED ORDER — ALUM & MAG HYDROXIDE-SIMETH 200-200-20 MG/5ML PO SUSP: 30.0000 mL | ORAL | Status: DC | PRN

## 2023-07-25 MED ORDER — GABAPENTIN 300 MG PO CAPS: 300.0000 mg | ORAL_CAPSULE | Freq: Three times a day (TID) | ORAL | 0 refills | Status: DC | PRN

## 2023-07-25 MED ORDER — OXYCODONE HCL 5 MG/5ML PO SOLN: 5.0000 mg | Freq: Once | ORAL | Status: DC | PRN

## 2023-07-25 MED ORDER — HEMOSTATIC AGENTS (NO CHARGE) OPTIME
TOPICAL | Status: DC | PRN
  Administered 2023-07-25: 1

## 2023-07-25 MED ORDER — DEXAMETHASONE SODIUM PHOSPHATE 4 MG/ML IJ SOLN
INTRAMUSCULAR | Status: DC | PRN
  Administered 2023-07-25: 5 mg via INTRAVENOUS

## 2023-07-25 MED ORDER — PROPOFOL 10 MG/ML IV BOLUS
INTRAVENOUS | Status: DC | PRN
  Administered 2023-07-25: 50 mg via INTRAVENOUS
  Administered 2023-07-25: 150 mg via INTRAVENOUS

## 2023-07-25 MED ORDER — ONDANSETRON HCL 4 MG/2ML IJ SOLN
INTRAMUSCULAR | Status: AC
Start: 2023-07-25 — End: ?
  Filled 2023-07-25: qty 2

## 2023-07-25 MED ORDER — ONDANSETRON HCL 4 MG PO TABS: 4.0000 mg | ORAL_TABLET | Freq: Four times a day (QID) | ORAL | Status: DC | PRN

## 2023-07-25 MED ORDER — FENTANYL CITRATE (PF) 100 MCG/2ML IJ SOLN: 25.0000 ug | INTRAMUSCULAR | Status: DC | PRN

## 2023-07-25 MED ORDER — LACTATED RINGERS IV SOLN
INTRAVENOUS | Status: DC
Start: 2023-07-25 — End: 2023-07-26

## 2023-07-25 MED ORDER — ROCURONIUM BROMIDE 10 MG/ML (PF) SYRINGE
PREFILLED_SYRINGE | INTRAVENOUS | Status: AC
Start: 2023-07-25 — End: ?
  Filled 2023-07-25: qty 10

## 2023-07-25 MED ORDER — MIDAZOLAM HCL 5 MG/5ML IJ SOLN
INTRAMUSCULAR | Status: DC | PRN
  Administered 2023-07-25: 2 mg via INTRAVENOUS

## 2023-07-25 MED ORDER — ONDANSETRON HCL 4 MG/2ML IJ SOLN
4.0000 mg | Freq: Four times a day (QID) | INTRAMUSCULAR | Status: DC | PRN
  Administered 2023-07-25: 4 mg via INTRAVENOUS

## 2023-07-25 MED ORDER — FENTANYL CITRATE (PF) 250 MCG/5ML IJ SOLN
INTRAMUSCULAR | Status: AC
  Filled 2023-07-25: qty 5

## 2023-07-25 MED ORDER — FENTANYL CITRATE (PF) 100 MCG/2ML IJ SOLN
25.0000 ug | INTRAMUSCULAR | Status: DC | PRN
  Administered 2023-07-25 (×3): 25 ug via INTRAVENOUS

## 2023-07-25 MED ORDER — METRONIDAZOLE 500 MG/100ML IV SOLN
500.0000 mg | INTRAVENOUS | Status: AC
  Administered 2023-07-25: 500 mg via INTRAVENOUS

## 2023-07-25 MED ORDER — ACETAMINOPHEN 325 MG PO TABS: 650.0000 mg | ORAL_TABLET | ORAL | Status: DC | PRN

## 2023-07-25 MED ORDER — PROPOFOL 10 MG/ML IV BOLUS
INTRAVENOUS | Status: AC
Start: 2023-07-25 — End: ?
  Filled 2023-07-25: qty 20

## 2023-07-25 MED ORDER — GABAPENTIN 100 MG PO CAPS
ORAL_CAPSULE | ORAL | Status: AC
Start: 2023-07-25 — End: ?
  Filled 2023-07-25: qty 1

## 2023-07-25 MED ORDER — HYDROMORPHONE HCL 1 MG/ML IJ SOLN: 0.2000 mg | INTRAMUSCULAR | Status: DC | PRN

## 2023-07-25 MED ORDER — POVIDONE-IODINE 10 % EX SWAB: 2.0000 | Freq: Once | CUTANEOUS | Status: DC

## 2023-07-25 MED ORDER — ACETAMINOPHEN 160 MG/5ML PO SOLN
325.0000 mg | ORAL | Status: DC | PRN
Start: 2023-07-25 — End: 2023-07-26

## 2023-07-25 MED ORDER — ONDANSETRON HCL 4 MG/2ML IJ SOLN
INTRAMUSCULAR | Status: AC
  Filled 2023-07-25: qty 2

## 2023-07-25 SURGICAL SUPPLY — 70 items
APPLICATOR ARISTA FLEXITIP XL (MISCELLANEOUS) IMPLANT
APPLICATOR COTTON TIP 6 STRL (MISCELLANEOUS) ×2 IMPLANT
APPLICATOR COTTON TIP 6IN STRL (MISCELLANEOUS) ×2 IMPLANT
BLADE SURG 10 STRL SS (BLADE) IMPLANT
CABLE HIGH FREQUENCY MONO STRZ (ELECTRODE) IMPLANT
CHLORAPREP W/TINT 26 (MISCELLANEOUS) ×2 IMPLANT
COVER BACK TABLE 60X90IN (DRAPES) ×2 IMPLANT
COVER MAYO STAND STRL (DRAPES) ×2 IMPLANT
COVER SURGICAL LIGHT HANDLE (MISCELLANEOUS) IMPLANT
DERMABOND ADVANCED .7 DNX12 (GAUZE/BANDAGES/DRESSINGS) ×2 IMPLANT
DEVICE RETRIEVAL ALEXIS 14 (MISCELLANEOUS) IMPLANT
DILATOR CANAL MILEX (MISCELLANEOUS) IMPLANT
DRAPE SURG IRRIG POUCH 19X23 (DRAPES) ×2 IMPLANT
EXTRT SYSTEM ALEXIS 14CM (MISCELLANEOUS) IMPLANT
EXTRT SYSTEM ALEXIS 17CM (MISCELLANEOUS) IMPLANT
GAUZE 4X4 16PLY ~~LOC~~+RFID DBL (SPONGE) ×4 IMPLANT
GLOVE BIO SURGEON STRL SZ 6.5 (GLOVE) ×2 IMPLANT
GLOVE BIOGEL PI IND STRL 6.5 (GLOVE) ×2 IMPLANT
GLOVE BIOGEL PI IND STRL 7.0 (GLOVE) ×4 IMPLANT
GLOVE ECLIPSE 6.5 STRL STRAW (GLOVE) ×4 IMPLANT
GLOVE INDICATOR 7.5 STRL GRN (GLOVE) IMPLANT
GOWN STRL REUS W/TWL XL LVL3 (GOWN DISPOSABLE) ×4 IMPLANT
HEMOSTAT ARISTA ABSORB 3G PWDR (HEMOSTASIS) IMPLANT
HIBICLENS CHG 4% 4OZ BTL (MISCELLANEOUS) IMPLANT
IRRIG SUCT STRYKERFLOW 2 WTIP (MISCELLANEOUS) ×2 IMPLANT
IRRIGATION SUCT STRKRFLW 2 WTP (MISCELLANEOUS) ×4 IMPLANT
IV NS 1000ML BAXH (IV SOLUTION) IMPLANT
KIT PINK PAD W/HEAD ARE REST (MISCELLANEOUS) ×2 IMPLANT
KIT PINK PAD W/HEAD ARM REST (MISCELLANEOUS) ×2 IMPLANT
KIT TURNOVER CYSTO (KITS) ×2 IMPLANT
LEGGING LITHOTOMY PAIR STRL (DRAPES) ×2 IMPLANT
LIGASURE VESSEL 5MM BLUNT TIP (ELECTROSURGICAL) ×2 IMPLANT
NDL HYPO 22X1.5 SAFETY MO (MISCELLANEOUS) IMPLANT
NDL INSUFFLATION 14GA 120MM (NEEDLE) ×2 IMPLANT
NEEDLE HYPO 22X1.5 SAFETY MO (MISCELLANEOUS) ×2 IMPLANT
NEEDLE INSUFFLATION 14GA 120MM (NEEDLE) ×2 IMPLANT
NS IRRIG 1000ML POUR BTL (IV SOLUTION) ×2 IMPLANT
OCCLUDER COLPOPNEUMO (BALLOONS) ×2 IMPLANT
PACK LAPAROSCOPY BASIN (CUSTOM PROCEDURE TRAY) ×2 IMPLANT
PENCIL SMOKE EVACUATOR (MISCELLANEOUS) IMPLANT
POUCH LAPAROSCOPIC INSTRUMENT (MISCELLANEOUS) ×2 IMPLANT
POWDER SURGICEL 3.0 GRAM (HEMOSTASIS) IMPLANT
PROTECTOR NERVE ULNAR (MISCELLANEOUS) ×2 IMPLANT
SCISSORS LAP 5X35 DISP (ENDOMECHANICALS) IMPLANT
SET IRRIG Y TYPE TUR BLADDER L (SET/KITS/TRAYS/PACK) ×2 IMPLANT
SET TRI-LUMEN FLTR TB AIRSEAL (TUBING) ×2 IMPLANT
SHEARS HARMONIC 36 ACE (MISCELLANEOUS) ×2 IMPLANT
SLEEVE SCD COMPRESS KNEE MED (STOCKING) ×2 IMPLANT
SUT VIC AB 0 CT1 27XBRD ANBCTR (SUTURE) ×4 IMPLANT
SUT VIC AB 4-0 PS2 18 (SUTURE) ×2 IMPLANT
SUT VICRYL 0 UR6 27IN ABS (SUTURE) IMPLANT
SUT VLOC 180 0 9IN GS21 (SUTURE) ×2 IMPLANT
SYR 10ML LL (SYRINGE) ×2 IMPLANT
SYR 50ML LL SCALE MARK (SYRINGE) ×4 IMPLANT
SYSTEM CARTER THOMASON II (TROCAR) IMPLANT
SYSTEM CONTND EXTRCTN KII BLLN (MISCELLANEOUS) IMPLANT
TIP ENDOSCOPIC SURGICEL (TIP) IMPLANT
TIP UTERINE 5.1X6CM LAV DISP (MISCELLANEOUS) IMPLANT
TIP UTERINE 6.7X10CM GRN DISP (MISCELLANEOUS) IMPLANT
TIP UTERINE 6.7X6CM WHT DISP (MISCELLANEOUS) IMPLANT
TIP UTERINE 6.7X8CM BLUE DISP (MISCELLANEOUS) IMPLANT
TOWEL OR 17X24 6PK STRL BLUE (TOWEL DISPOSABLE) ×4 IMPLANT
TRAY FOLEY W/BAG SLVR 14FR LF (SET/KITS/TRAYS/PACK) ×2 IMPLANT
TROCAR ADV FIXATION 5X100MM (TROCAR) ×2 IMPLANT
TROCAR KII 8X100ML NONTHREADED (TROCAR) ×2 IMPLANT
TROCAR PORT AIRSEAL 5X120 (TROCAR) ×2 IMPLANT
TROCAR Z-THREAD FIOS 5X100MM (TROCAR) ×2 IMPLANT
WARMER LAPAROSCOPE (MISCELLANEOUS) ×2 IMPLANT
WATER STERILE IRR 3000ML UROMA (IV SOLUTION) ×2 IMPLANT
WATER STERILE IRR 500ML POUR (IV SOLUTION) IMPLANT

## 2023-07-25 NOTE — Plan of Care (Signed)
Called by nursing staff.  Pt is requesting discharge home.  Minimal spotting.  Has voided 300cc.  Walked full circle in [facility].  Eaten.  Pain score is 2/10.  Feels she would be more comfortable at home.  VSS/AF.  Pulse in 60's.  Discharge orders will be completed.

## 2023-07-25 NOTE — Progress Notes (Signed)
Patient requested a pain pill right before discharge. RN gave her one percocet. Immediately after taking she threw up the pill. Patient stated she would wait to take another pill when she got home. Patient stated she had no more nausea after that episode of emesis and still wanted to go home.

## 2023-07-25 NOTE — Op Note (Signed)
07/25/2023  4:13 PM  PATIENT:  Debra Miller  43 y.o. female  PRE-OPERATIVE DIAGNOSIS:  Menorrhagia, Fibroids, Pelvic Pain, Iron Deficiency anemia  POST-OPERATIVE DIAGNOSIS:  Menorrhagia, Fibroids, Pelvic Pain, Iron Deficiency anemia  PROCEDURE:  Procedure(s): TOTAL LAPAROSCOPIC HYSTERECTOMY WITH SALPINGECTOMY CYSTOSCOPY  SURGEON:  Jerene Bears  ASSISTANTS: Dr. Briscoe Deutscher.  An experienced assistant was required given the standard of surgical care given the complexity of the case.  This assistant was needed for exposure, dissection, suctioning, retraction, instrument exchange and for overall help during the procedure.  RNFA help was also unavailable.  ANESTHESIA:   general  ESTIMATED BLOOD LOSS: 125 mL  BLOOD ADMINISTERED:none   FLUIDS: 1400cc LR  UOP: 100 cc clear UOP  SPECIMEN:  uterus, cervix and bilateral fallopian tubes  DISPOSITION OF SPECIMEN:  PATHOLOGY  FINDINGS: bulky uterus, normal ovaries  DESCRIPTION OF OPERATION: Patient is taken to the operating room. She is placed in the supine position. She is a running IV in place. Informed consent was present on the chart. SCDs on her lower extremities and functioning properly. Patient was positioned while she was awake.  Her legs were placed in the low lithotomy position in Arboles stirrups. Her arms were tucked by the side.  General endotracheal anesthesia was administered by the anesthesia staff without difficulty. Dr. Sampson Goon, anesthesia, oversaw case.  Time out performed.    Clora prep was then used to prep the abdomen and Hibiclens was used to prep the inner thighs, perineum and vagina. Once 3 minutes had past the patient was draped in a normal standard fashion. The legs were lifted to the high lithotomy position. The cervix was visualized by placing a heavy weighted speculum in the posterior aspect of the vagina and using a curved Deaver retractor to the retract anteriorly. The anterior lip of the cervix was grasped with  single-tooth tenaculum.  The cervix sounded to 10 cm. Pratt dilators were used to dilate the cervix up to a #21. A RUMI uterine manipulator was obtained. A #10 disposable tip was placed on the RUMI manipulator as well as a 3.5, silver KOH ring. This was passed through the cervix and the bulb of the disposable tip was inflated with 10 cc of normal saline. There was a good fit of the KOH ring around the cervix. The tenaculum was removed. There is also good manipulation of the uterus. The speculum and retractor were removed as well. A Foley catheter was placed to straight drain.  Clear urine was noted. Legs were lowered to the low lithotomy position and attention was turned the abdomen.  The umbilicus was everted.  Marcaine 0.25% used to anesthetize the skin.  Using #11 blade, 5mm skin incision was made.  A Veress needle was obtained. Syringe of sterile saline was placed on a open Veress needle.  With the abdomen elevated, the Veress needle was passed into the umbilicus until the pop was heard and then fluid started to drip.  Then low flow CO2 gas was attached the needle and the pneumoperitoneum was achieved without difficulty. Once four liters of gas was in the abdomen the Veress needle was removed and a 5 millimeter non-bladed Optiview trocar and port were passed directly to the abdomen. The laparoscope was then used to confirm intraperitoneal placement. Findings noted above.  Locations for RLQ, LLQ, and suprapubic ports were noted by transillumination of the abdominal wall.  0.25% marcaine was used to anesthetize the skin.  8mm skin incision was made in the RLQ and an AirSeal  port was placed underdirect visualization of the laparoscope.  Then a 5mm skin incision was made and a 5mm nonbladed trochar and port was placed in the LLQ.  Finally, and 8mm skin incision was made about 4cm above the pubic symphasis and an 8mm non-bladed port was placed with direct visualization of the laparoscope.  All trochars were  removed.    Ureters were identifies.  Attention was turned to the left side. With uterus on stretch the left tube was excised off the ovary and mesosalpinx was dissected to free the tube. Then the left utero-ovarian pedicle was serially clamped cauterized and incised using the ligasure device. Left round ligament was serially clamped cauterized and incised. The anterior and posterior peritoneum of the inferior leaf of the broad ligament were opened. The beginning of the bladder flap was created.  The bladder was taken down below the level of the KOH ring. The left uterine artery skeletonized and then just superior to the KOH ring this vessel was serially clamped, cauterized, and incised.  Attention was turned the right side.  The uterus was placed on stretch to the opposite side.  The tube was excised off the ovary using sharp dissection a bipolar cautery.  The mesosalpinx was incised freeing the tube. Then the right uterine ovarian pedicle was serially clamped cauterized and incised. Next the right round ligament was serially clamped cauterized and incised. The anterior posterior peritoneum of the inferiorly for the broad ligament were opened. The anterior peritoneum was carried across to the dissection on the left side. The remainder of the bladder flap was created using sharp dissection. The bladder was well below the level of the KOH ring. The right uterine artery skeletonized. Then the right uterine artery, above the level of the KOH ring, was serially clamped cauterized and incised. The uterus was devascularized at this point.  The colpotomy was performed a starting in the midline and using a harmonic scalpel with the inferior edge of the open blade  This was carried around a circumferential fashion until the vaginal mucosa was completely incised in the specimen was freed.  The specimen was then delivered to the vagina.  A vaginal occlusive device was used to maintain the pneumoperitoneum  Instruments  were changed with a needle driver and Kobra graspers.  Using a 9 inch V. lock suture, the cuff was closed by incorporating the anterior and posterior vaginal mucosa in each stitch. This was carried across all the way to the left corner and a running fashion. Two stitches were brought back towards the midline and the suture was cut flush with the vagina. The needle was brought out the pelvis. The pelvis was irrigated. All pedicles were inspected.  Small amount of bleeding along the posterior cuff on the right side noted.  This was on the cuff and made hemostatic with ligasure device without difficulty.  Pelvis was irrigated.   Co2 pressures were lowered to 7mm Hg.  Cuff was watched for several minutes.  Again, no bleeding was noted.  Ureters were noted deep in the pelvis to be peristalsing.  Arista was placed along the pedicles.  At this point the procedure was completed.  The remaining instruments were removed.  The ports (except the suprapubic port) were removed under direct visualization of the laparoscope and the pneumoperitoneum was relieved.  The patient was taken out of Trendelenburg positioning.  Several deep breaths were given to the patient's trying to any gas the abdomen and finally the suprapubic port was removed.  The skin was then closed with subcuticular stitches of 3-0 Vicryl. The skin was cleansed Dermabond was applied. Attention was then turned the vagina and the cuff was inspected. No bleeding was noted. The anterior posterior vaginal mucosa was incorporated in each stitch. The Foley catheter was removed.  Cystoscopy was performed.  No sutures or bladder injuries were noted.  Ureters were noted with normal urine jets from each one was seen.  Foley was left out after the cystoscopic fluid was drained and cystoscope removed.  Sponge, lap, needle, instrument counts were correct x2. Patient tolerated the procedure very well. She was awakened from anesthesia, extubated and taken to recovery in stable  condition.    COUNTS:  YES  PLAN OF CARE: Transfer to PACU

## 2023-07-25 NOTE — Progress Notes (Addendum)
Called Dr. Hyacinth Meeker to ask about CBC order scheduled for tomorrow. If patient is up, walking around and doing well enough to go home tonight, there's no need to do CBC before discharge. She will be scheduling Iron infusion regardless.

## 2023-07-25 NOTE — Transfer of Care (Signed)
Immediate Anesthesia Transfer of Care Note  Patient: Debra Miller  Procedure(s) Performed: Procedure(s) (LRB): TOTAL LAPAROSCOPIC HYSTERECTOMY WITH SALPINGECTOMY (Bilateral) CYSTOSCOPY (N/A)  Patient Location: PACU  Anesthesia Type: GA  Level of Consciousness: awake, sedated, patient cooperative and responds to stimulation, c/o pain in back - comfort measures given w/ medication   Airway & Oxygen Therapy: Patient Spontanous Breathing and Patient connected to [place] oxygen  Post-op Assessment: Report given to PACU RN, Post -op Vital signs reviewed and stable and Patient moving all extremities  Post vital signs: Reviewed and stable  Complications: No apparent anesthesia complications

## 2023-07-25 NOTE — H&P (Signed)
Debra Miller is an 43 y.o. female G9P5 MWF here for definitive treatment of menorrhagia due to fibroid uterus.  Pt also has iron deficiency anemia and has been on oral iron.  Pre op hb was 10.3.  Pre op ultrasound showed uterus measuring 10.4 x 6.6 x 5.0cm with several small fibroids.  Uterine volume was .  Endometrium 6.48mm.  Ovaries normal.  Endometrial biopsy was negative.  Pt desirous of definitive treatment so TLH planned.    Procedure reviewed again today.  Risks have been reviewed and documented in prior not but include and are not limited to bleeding, 1% risk of receiving a  transfusion, infection, 3-4% risk of bowel/bladder/ureteral/vascular injury discussed as well as possible need for additional surgery if injury does occur discussed.  DVT/PE and rare risk of death discussed.  Vaginal cuff dehiscence discussed.  Hernia formation discussed.  Positioning and incision locations discussed.  Patient aware if pathology abnormal she may need additional treatment.  There is no alternative to this as she is desirous of definitive treatment.  Pertinent Gynecological History: Menses:  heavy and irregular at times Contraception: none DES exposure: denies Blood transfusions:  yes with gastric bypass Sexually transmitted diseases: no past history Previous GYN Procedures:  hysteroscopy   Last mammogram: normal Date: 05/11/2023 Last pap: normal Date: 04/25/2023 OB History: G7, P5   Menstrual History: Patient's last menstrual period was 07/12/2023 (exact date).    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   History of type 2 diabetes mellitus    since gastric bypass 05-24-2021 ,  A1c normalized,  stopped meds 09/ 2023   Hyperlipidemia, mixed    Hypertension    IDA (iron deficiency  anemia)    MDD (major depressive disorder)    Menorrhagia with irregular cycle    Migraines    with aura   Mild intermittent asthma    Mild obstructive sleep apnea    (07-17-2023  pt stated was using cpap before and after gastric bypass surgery, but stopped after wt loss,  stated  is rescheduled for retesting ) sleep study w/ dr dohmeier--- 12-27-2019 in epic mild osa, AHI 10/hr , recommmended cpap   PCOS (polycystic ovarian syndrome)    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 stechschulte;    roux-en-y   Seasonal allergies    Urine incontinence    Uterine leiomyoma    Wears glasses     Past Surgical History:  Procedure Laterality Date   DILATION AND EVACUATION  09/15/1999   @ [facility] by dr Edward Jolly;  missed ab   KNEE ARTHROSCOPY Left 2018   LAPAROSCOPY WITH TUBAL LIGATION Bilateral 05/08/2007   @ [facility] by dr April Holding;   cautery used   RADIOLOGY WITH ANESTHESIA N/A 09/22/2020   Procedure: MRI WITH ANESTHESIA CERVICAL SPINE WITHOUT AND BRAIN WITHOUT;  Surgeon: Radiologist, Medication, MD;  Location: [facility] OR;  Service: Radiology;  Laterality: N/A;   RADIOLOGY WITH ANESTHESIA N/A 05/18/2021   Procedure: MRI WITH ANESTHESIA  THORACIC WITHOUT CONTRAST AND LUMBAR WITHOUT CONTRAST;  Surgeon: Radiologist, Medication, MD;  Location: [facility] OR;  Service: Radiology;  Laterality: N/A;   ROUX-EN-Y GASTRIC BYPASS  05/24/2021   @ [facility] by Dr.  Stechschulte;   Robot Assisted Roux-En-Y Gastric Bypass and Upper Endoscopy   TONSILLECTOMY AND ADENOIDECTOMY  1990   TYMPANOSTOMY TUBE PLACEMENT Bilateral    child    Family History  Problem Relation Age of Onset   Arthritis Mother    High Cholesterol Mother    Hypertension Mother    Diabetes Mother    Rheum arthritis Mother    Asthma Mother    Hyperlipidemia Mother    Migraines Mother    Thyroid disease Mother    Colon polyps Mother    Arthritis Maternal Grandmother    Breast cancer Maternal Grandmother    High Cholesterol Maternal  Grandmother    Stroke Maternal Grandmother    Hypertension Maternal Grandmother    Migraines Maternal Grandmother    Colon polyps Sister    Colon polyps Maternal Aunt    Colon cancer Neg Hx    Esophageal cancer Neg Hx    Rectal cancer Neg Hx    Stomach cancer Neg Hx     Social History:  reports that she has never smoked. She has never used smokeless tobacco. She reports that she does not drink alcohol and does not use drugs.  Allergies:  Allergies  Allergen Reactions   Ceclor [Cefaclor] Hives   Nsaids Other (See Comments)    Has had bariatric sx and can not take these meds   Pediapred [Prednisolone Sodium Phosphate] Hives    Medications Prior to Admission  Medication Sig Dispense Refill Last Dose/Taking   Calcium Carb-Cholecalciferol (CALCIUM 500/D) 500-10 MG-MCG CHEW Chew by mouth 3 (three) times daily. Pt stated one chew every 2 hours (Patient taking differently: Chew by mouth 3 (three) times daily. Pt stated one chew every 2 hours)   07/24/2023   ferrous sulfate 324 MG TBEC Take 324 mg by mouth daily.   07/24/2023   levocetirizine (XYZAL ALLERGY 24HR) 5 MG tablet Take 1 tablet (5 mg total) by mouth every evening. 90 tablet 3 07/24/2023   Multiple Vitamins-Minerals (BARIATRIC MULTIVITAMINS) CAPS Take 1 capsule by mouth in the morning, at noon, in the evening, and at bedtime. Pt stated takes on capsule 2 hours in between each capsule (Patient taking differently: Take 1 capsule by mouth in the morning, at noon, in the evening, and at bedtime. Pt stated takes on capsule 2 hours in between each capsule)   07/24/2023   spironolactone (ALDACTONE) 25 MG tablet Take 1 tablet (25 mg total) by mouth daily. (Patient taking differently: Take 25 mg by mouth daily.) 90 tablet 3 07/24/2023   valACYclovir (VALTREX) 1000 MG tablet TAKE 1 TABLET BY MOUTH EVERY DAY 90 tablet 3 07/24/2023   Vitamin D, Ergocalciferol, (DRISDOL) 1.25 MG (50000 UNIT) CAPS capsule Take 1 capsule (50,000 Units total) by mouth  every 7 (seven) days. (Patient taking differently: Take 50,000 Units by mouth every 7 (seven) days. Saturday's) 12 capsule 4 Past Week   albuterol (VENTOLIN HFA) 108 (90 Base) MCG/ACT inhaler Inhale 2 puffs into the lungs every 6 (six) hours as needed for wheezing or shortness of breath. (Patient taking differently: Inhale 2 puffs into the lungs every 6 (six) hours as needed for wheezing or shortness of breath.) 54 g 3 Unknown    Review of Systems  Constitutional: Negative.   Respiratory: Negative.    Cardiovascular: Negative.   Genitourinary: Negative.     Blood pressure (!) 149/79, pulse 74, temperature 98.4 F (36.9 C), temperature source Oral, resp. rate 17, height 5\' 3"  (1.6 m), weight 94.8 kg, last  menstrual period 07/12/2023, SpO2 100%. Physical Exam Constitutional:      Appearance: Normal appearance.  Cardiovascular:     Rate and Rhythm: Normal rate and regular rhythm.  Pulmonary:     Effort: Pulmonary effort is normal.     Breath sounds: Normal breath sounds.  Neurological:     General: No focal deficit present.     Mental Status: She is alert.  Psychiatric:        Mood and Affect: Mood normal.        Behavior: Behavior normal.     Results for orders placed or performed during the hospital encounter of 07/25/23 (from the past 24 hours)  Pregnancy, urine POC     Status: None   Collection Time: 07/25/23 11:47 AM  Result Value Ref Range   Preg Test, Ur NEGATIVE NEGATIVE  Glucose, capillary     Status: None   Collection Time: 07/25/23 12:28 PM  Result Value Ref Range   Glucose-Capillary 77 70 - 99 mg/dL     Assessment/Plan: 43 yo G7P5 MWF with menorrhagia with irregular cycles, iron deficiency anemia, fibroids here for definitive surgery with TLH/bilateral salpingectomy/cytoscopy, possible oophorectomy.  Questions answered.  Pt ready to proceed.  Jerene Bears 07/25/2023, 1:11 PM

## 2023-07-25 NOTE — Anesthesia Procedure Notes (Signed)
Procedure Name: Intubation Date/Time: 07/25/2023 1:39 PM  Performed by: Jessica Priest, CRNAPre-anesthesia Checklist: Patient identified, Emergency Drugs available, Suction available, Patient being monitored and Timeout performed Patient Re-evaluated:Patient Re-evaluated prior to induction Oxygen Delivery Method: Circle system utilized Preoxygenation: Pre-oxygenation with 100% oxygen Induction Type: IV induction Ventilation: Mask ventilation without difficulty Laryngoscope Size: Mac and 3 Grade View: Grade II Tube type: Oral Tube size: 7.0 mm Number of attempts: 1 Airway Equipment and Method: Stylet and Oral airway Placement Confirmation: ETT inserted through vocal cords under direct vision, positive ETCO2, breath sounds checked- equal and bilateral and CO2 detector Secured at: 23 cm Tube secured with: Tape Dental Injury: Teeth and Oropharynx as per pre-operative assessment

## 2023-07-25 NOTE — Discharge Instructions (Addendum)
Post Op Hysterectomy Instructions Please read the instructions below. Refer to these instructions for the next few weeks. These instructions provide you with general information on caring for yourself after surgery. Your caregiver may also give you specific instructions. While your treatment has been planned according to the most current medical practices available, unavoidable problems sometimes happen. If you have any problems or questions after you leave, please call your caregiver.  HOME CARE INSTRUCTIONS Healing will take time. You will have discomfort, tenderness, swelling and bruising at the operative site for a couple of weeks. This is normal and will get better as time goes on.  Only take over-the-counter or prescription medicines for pain, discomfort or fever as directed by your caregiver.  Do not take aspirin. It can cause bleeding.  Do not drive when taking pain medication.  Follow your caregiver's advice regarding diet, exercise, lifting, driving and general activities.  Resume your usual diet as directed and allowed.  Get plenty of rest and sleep.  Do not douche, use tampons, or have sexual intercourse until your caregiver gives you permission. .  Take your temperature if you feel hot or flushed.  You may shower today when you get home.  No tub bath for one week.   Do not drink alcohol until you are not taking any narcotic pain medications.  Try to have someone home with you for a week or two to help with the household activities.   Be careful over the next two to three weeks with any activities at home that involve lifting, pushing, or pulling.  Listen to your body--if something feels uncomfortable to do, then don't do it. Make sure you and your family understands everything about your operation and recovery.  Walking up stairs is fine. Do not sign any legal documents until you feel normal again.  Keep all your follow-up appointments as recommended by your caregiver.   PLEASE CALL  THE OFFICE IF: There is swelling, redness or increasing pain in the wound area.  Pus is coming from the wound.  You notice a bad smell from the wound or surgical dressing.  You have pain, redness and swelling from the intravenous site.  The wound is breaking open (the edges are not staying together).   You develop pain or bleeding when you urinate.  You develop abnormal vaginal discharge.  You have any type of abnormal reaction or develop an allergy to your medication.  You need stronger pain medication for your pain   SEEK IMMEDIATE MEDICAL CARE: You develop a temperature of 100.5 or higher.  You develop abdominal pain.  You develop chest pain.  You develop shortness of breath.  You pass out.  You develop pain, swelling or redness of your leg.  You develop heavy vaginal bleeding with or without blood clots.   MEDICATIONS: Restart your regular medications BUT wait one week before restarting all vitamins and mineral supplements.  This helps your bowel function become more normal before starting vitamins. Dr. Hyacinth Meeker wrote two pain medications for you.  The first is gabepentin 300mg  every 8 hours as needed.  This can make you sleepy.  If you feel that side effect it too bothersome, there is a lower dosage.  Please let Dr. Hyacinth Meeker know.  You can take gabapentin and Percocet both if needed.  Gabapentin is not a narcotic.   The second pain medication is Percocet 5/325 1-2 tabs every 4-6 hours as needed for pain.  Each Percocet contains 325mg  tylenol.  If you only  take one Percoet, you can also take a 500mg  Tylenol at the same time.  The maximum amount of Tylenol you can take at once is two 500mg  tablets (1000mg ) and you can take this up to four times daily. You may use an over the counter stool softener like Colace or Dulcolax to help with starting a bowel movement.  Start the day after you go home.  Warm liquids, fluids, and ambulation help too.  If you have not had a bowel movement in four days,  you need to call the office.  The iron infusions have been ordered.  You can plan to start them next week after your one week post op appointment.

## 2023-07-25 NOTE — Anesthesia Preprocedure Evaluation (Addendum)
Anesthesia Evaluation  Patient identified by MRN, date of birth, ID band Patient awake    Reviewed: Allergy & Precautions, H&P , NPO status , Patient's Chart, lab work & pertinent test results  Airway Mallampati: I  TM Distance: >3 FB Neck ROM: Full    Dental no notable dental hx. (+) Teeth Intact, Dental Advisory Given   Pulmonary asthma , sleep apnea    Pulmonary exam normal breath sounds clear to auscultation       Cardiovascular hypertension,  Rhythm:Regular Rate:Normal     Neuro/Psych  Headaches  Anxiety Depression Bipolar Disorder      GI/Hepatic Neg liver ROS,GERD  Medicated,,  Endo/Other    Class 3 obesity  Renal/GU negative Renal ROS  negative genitourinary   Musculoskeletal   Abdominal   Peds  Hematology  (+) Blood dyscrasia, anemia   Anesthesia Other Findings   Reproductive/Obstetrics negative OB ROS                             Anesthesia Physical Anesthesia Plan  ASA: 3  Anesthesia Plan: General   Post-op Pain Management: Tylenol PO (pre-op)*   Induction: Intravenous  PONV Risk Score and Plan: 4 or greater and Ondansetron, Dexamethasone and Midazolam  Airway Management Planned: Oral ETT  Additional Equipment:   Intra-op Plan:   Post-operative Plan: Extubation in OR  Informed Consent: I have reviewed the patients History and Physical, chart, labs and discussed the procedure including the risks, benefits and alternatives for the proposed anesthesia with the patient or authorized representative who has indicated his/her understanding and acceptance.     Dental advisory given  Plan Discussed with: CRNA  Anesthesia Plan Comments:        Anesthesia Quick Evaluation

## 2023-07-26 NOTE — Anesthesia Postprocedure Evaluation (Signed)
Anesthesia Post Note  Patient: Debra Miller  Procedure(s) Performed: TOTAL LAPAROSCOPIC HYSTERECTOMY WITH SALPINGECTOMY (Bilateral: Abdomen) CYSTOSCOPY (Bladder)     Patient location during evaluation: PACU Anesthesia Type: General Level of consciousness: awake and alert Pain management: pain level controlled Vital Signs Assessment: post-procedure vital signs reviewed and stable Respiratory status: spontaneous breathing, nonlabored ventilation and respiratory function stable Cardiovascular status: blood pressure returned to baseline and stable Postop Assessment: no apparent nausea or vomiting Anesthetic complications: no   No notable events documented.                 Latashia Koch,W. EDMOND

## 2023-07-27 ENCOUNTER — Telehealth: Payer: Self-pay

## 2023-07-27 ENCOUNTER — Encounter (HOSPITAL_BASED_OUTPATIENT_CLINIC_OR_DEPARTMENT_OTHER): Payer: Self-pay | Admitting: Obstetrics & Gynecology

## 2023-07-27 LAB — SURGICAL PATHOLOGY

## 2023-07-27 NOTE — Telephone Encounter (Signed)
Dr. Hyacinth Meeker, patient will be scheduled as soon as possible.  Auth Submission: NO AUTH NEEDED Site of care: Site of care: [facility] Payer: [employer] Medication & CPT/J Code(s) submitted: Venofer (Iron Sucrose) J1756 Route of submission (phone, fax, portal):  Phone # Fax # Auth type: Buy/Bill PB Units/visits requested: 200mg  x 4 doses Reference number:  Approval from: 07/27/23 to 12/25/23

## 2023-08-01 ENCOUNTER — Ambulatory Visit (HOSPITAL_BASED_OUTPATIENT_CLINIC_OR_DEPARTMENT_OTHER): Payer: BC Managed Care – PPO | Admitting: Obstetrics & Gynecology

## 2023-08-01 ENCOUNTER — Encounter (HOSPITAL_BASED_OUTPATIENT_CLINIC_OR_DEPARTMENT_OTHER): Payer: Self-pay | Admitting: Obstetrics & Gynecology

## 2023-08-01 VITALS — BP 158/78 | HR 75 | Wt 204.8 lb

## 2023-08-01 DIAGNOSIS — R300 Dysuria: Secondary | ICD-10-CM | POA: Diagnosis not present

## 2023-08-01 DIAGNOSIS — Z90710 Acquired absence of both cervix and uterus: Secondary | ICD-10-CM

## 2023-08-01 DIAGNOSIS — G8918 Other acute postprocedural pain: Secondary | ICD-10-CM

## 2023-08-01 LAB — POCT URINALYSIS DIPSTICK
Bilirubin, UA: NEGATIVE
Glucose, UA: NEGATIVE
Ketones, UA: NEGATIVE
Leukocytes, UA: NEGATIVE
Nitrite, UA: NEGATIVE
Protein, UA: POSITIVE — AB
Spec Grav, UA: >=1.03 — AB (ref 1.010–1.025)
Urobilinogen, UA: 0.2 U/dL
pH, UA: 5.5 (ref 5.0–8.0)

## 2023-08-01 LAB — CBC WITH DIFFERENTIAL/PLATELET
Basophils Absolute: 0.1 10*3/uL (ref 0.0–0.2)
Basos: 1 %
EOS (ABSOLUTE): 0.7 10*3/uL — ABNORMAL HIGH (ref 0.0–0.4)
Eos: 8 %
Hematocrit: 35 % (ref 34.0–46.6)
Hemoglobin: 10.5 g/dL — ABNORMAL LOW (ref 11.1–15.9)
Immature Grans (Abs): 0 10*3/uL (ref 0.0–0.1)
Immature Granulocytes: 0 %
Lymphocytes Absolute: 3 10*3/uL (ref 0.7–3.1)
Lymphs: 34 %
MCH: 22.8 pg — ABNORMAL LOW (ref 26.6–33.0)
MCHC: 30 g/dL — ABNORMAL LOW (ref 31.5–35.7)
MCV: 76 fL — ABNORMAL LOW (ref 79–97)
Monocytes Absolute: 0.5 10*3/uL (ref 0.1–0.9)
Monocytes: 6 %
Neutrophils Absolute: 4.8 10*3/uL (ref 1.4–7.0)
Neutrophils: 51 %
Platelets: 298 10*3/uL (ref 150–450)
RBC: 4.61 x10E6/uL (ref 3.77–5.28)
RDW: 13.8 % (ref 11.7–15.4)
WBC: 9.1 10*3/uL (ref 3.4–10.8)

## 2023-08-01 MED ORDER — OXYCODONE-ACETAMINOPHEN 5-325 MG PO TABS: 1.0000 | ORAL_TABLET | Freq: Four times a day (QID) | ORAL | 0 refills | Status: DC | PRN

## 2023-08-01 MED ORDER — GABAPENTIN 300 MG PO CAPS: 600.0000 mg | ORAL_CAPSULE | Freq: Three times a day (TID) | ORAL | 0 refills | Status: DC | PRN

## 2023-08-01 MED ORDER — NITROFURANTOIN MONOHYD MACRO 100 MG PO CAPS: 100.0000 mg | ORAL_CAPSULE | Freq: Two times a day (BID) | ORAL | 0 refills | Status: DC

## 2023-08-01 MED ORDER — CYCLOBENZAPRINE HCL 10 MG PO TABS: 10.0000 mg | ORAL_TABLET | Freq: Three times a day (TID) | ORAL | 1 refills | Status: DC | PRN

## 2023-08-01 NOTE — Progress Notes (Signed)
GYNECOLOGY  VISIT  CC:   post op recheck  HPI: 43 y.o. G7P5025 Married White or Caucasian female here for recheck after undergoing TLH/bilateral salpingectomy/cystoscopy on 07/25/2023.  She reports bleeding is minimal.  She has some pain that is most significant when she feels bloated.  This is a symptom that has been present since bariatric surgery but does seem to exacerbate post op pain.  Has had some low grade fevers but nothing > 100.4.  Bowel function is Normal.  Bladder function is normal.    Pathology reviewed:  Yes .  Questions answered.    MEDS:   Current Outpatient Medications on File Prior to Visit  Medication Sig Dispense Refill   albuterol (VENTOLIN HFA) 108 (90 Base) MCG/ACT inhaler Inhale 2 puffs into the lungs every 6 (six) hours as needed for wheezing or shortness of breath. (Patient taking differently: Inhale 2 puffs into the lungs every 6 (six) hours as needed for wheezing or shortness of breath.) 54 g 3   Calcium Carb-Cholecalciferol (CALCIUM 500/D) 500-10 MG-MCG CHEW Chew by mouth 3 (three) times daily. Pt stated one chew every 2 hours (Patient taking differently: Chew by mouth 3 (three) times daily. Pt stated one chew every 2 hours)     ferrous sulfate 324 MG TBEC Take 324 mg by mouth daily.     gabapentin (NEURONTIN) 300 MG capsule Take 1 capsule (300 mg total) by mouth 3 (three) times daily as needed (pain). 30 capsule 0   levocetirizine (XYZAL ALLERGY 24HR) 5 MG tablet Take 1 tablet (5 mg total) by mouth every evening. 90 tablet 3   Multiple Vitamins-Minerals (BARIATRIC MULTIVITAMINS) CAPS Take 1 capsule by mouth in the morning, at noon, in the evening, and at bedtime. Pt stated takes on capsule 2 hours in between each capsule (Patient taking differently: Take 1 capsule by mouth in the morning, at noon, in the evening, and at bedtime. Pt stated takes on capsule 2 hours in between each capsule)     ondansetron (ZOFRAN) 4 MG tablet Take 1 tablet (4 mg total) by mouth every 6  (six) hours as needed for nausea. 15 tablet 0   oxyCODONE-acetaminophen (PERCOCET/ROXICET) 5-325 MG tablet Take 1-2 tablets by mouth every 6 (six) hours as needed for moderate pain (pain score 4-6). 30 tablet 0   spironolactone (ALDACTONE) 25 MG tablet Take 1 tablet (25 mg total) by mouth daily. (Patient taking differently: Take 25 mg by mouth daily.) 90 tablet 3   valACYclovir (VALTREX) 1000 MG tablet TAKE 1 TABLET BY MOUTH EVERY DAY 90 tablet 3   Vitamin D, Ergocalciferol, (DRISDOL) 1.25 MG (50000 UNIT) CAPS capsule Take 1 capsule (50,000 Units total) by mouth every 7 (seven) days. (Patient taking differently: Take 50,000 Units by mouth every 7 (seven) days. Saturday's) 12 capsule 4   No current facility-administered medications on file prior to visit.    SH:  Smoking No    PHYSICAL EXAMINATION:    BP (!) 158/78 (BP Location: Left Arm, Patient Position: Sitting, Cuff Size: Large)   Pulse 75   Wt 204 lb 12.8 oz (92.9 kg)   LMP 07/12/2023 (Exact Date)   BMI 36.28 kg/m     General appearance: alert, cooperative and appears stated age CV:  Regular rate and rhythm Lungs:  clear to auscultation, no wheezes, rales or rhonchi, symmetric air entry Abdomen: soft, non-tender; bowel sounds normal; no masses,  no organomegaly Incisions:  C/D/I  Pelvic: External genitalia:  no lesions  Urethra:  normal appearing urethra with no masses, tenderness or lesions              Bartholins and Skenes: normal                 Vagina: normal appearing vagina with normal color, minimal discharge, no bleeding, no odor, no lesions              Cervix: absent              Bimanual Exam:  cuff appropriately tender, no fullness or mass noted              Chaperone, Ina Homes, was present for exam.  Assessment/Plan: 1. Post-op pain (Primary) - reassured pt that exam is normal today.  Will change pain regimen and check CBC - gabapentin (NEURONTIN) 300 MG capsule; Take 2 capsules (600 mg total)  by mouth 3 (three) times daily as needed (pain).  Dispense: 60 capsule; Refill: 0 - oxyCODONE-acetaminophen (PERCOCET/ROXICET) 5-325 MG tablet; Take 1-2 tablets by mouth every 6 (six) hours as needed for moderate pain (pain score 4-6).  Dispense: 20 tablet; Refill: 0 - cyclobenzaprine (FLEXERIL) 10 MG tablet; Take 1 tablet (10 mg total) by mouth every 8 (eight) hours as needed for muscle spasms.  Dispense: 30 tablet; Refill: 1 - CBC with Differential/Platelet  2. Dysuria - nitrofurantoin, macrocrystal-monohydrate, (MACROBID) 100 MG capsule; Take 1 capsule (100 mg total) by mouth 2 (two) times daily.  Dispense: 10 capsule; Refill: 0 - Urine Culture - POCT Urinalysis Dipstick  3. H/O: hysterectomy - recheck 1 weeks

## 2023-08-02 ENCOUNTER — Encounter (HOSPITAL_BASED_OUTPATIENT_CLINIC_OR_DEPARTMENT_OTHER): Payer: Self-pay | Admitting: Obstetrics & Gynecology

## 2023-08-03 LAB — URINE CULTURE

## 2023-08-09 ENCOUNTER — Encounter (HOSPITAL_BASED_OUTPATIENT_CLINIC_OR_DEPARTMENT_OTHER): Payer: Self-pay | Admitting: Obstetrics & Gynecology

## 2023-08-09 ENCOUNTER — Ambulatory Visit (HOSPITAL_BASED_OUTPATIENT_CLINIC_OR_DEPARTMENT_OTHER): Payer: BC Managed Care – PPO | Admitting: Obstetrics & Gynecology

## 2023-08-09 VITALS — BP 125/97 | HR 75 | Wt 205.0 lb

## 2023-08-09 DIAGNOSIS — Z9889 Other specified postprocedural states: Secondary | ICD-10-CM

## 2023-08-09 DIAGNOSIS — G8918 Other acute postprocedural pain: Secondary | ICD-10-CM | POA: Diagnosis not present

## 2023-08-09 DIAGNOSIS — G8929 Other chronic pain: Secondary | ICD-10-CM | POA: Diagnosis not present

## 2023-08-09 DIAGNOSIS — M545 Low back pain, unspecified: Secondary | ICD-10-CM

## 2023-08-09 MED ORDER — GABAPENTIN 300 MG PO CAPS: 600.0000 mg | ORAL_CAPSULE | Freq: Three times a day (TID) | ORAL | 0 refills | Status: DC | PRN

## 2023-08-10 ENCOUNTER — Ambulatory Visit: Payer: BC Managed Care – PPO

## 2023-08-12 NOTE — Progress Notes (Signed)
 GYNECOLOGY  VISIT  CC:   post op recheck  HPI: 43 y.o. G7P5025 Married White or Caucasian female here for recheck after undergoing TLH/bilateral salpingectomy/cystoscopy on 07/25/2023.  She was seen last week and was having a lot of pain with sitting in certain positions.  From surgical standpoint, she looked goo and exam was reassuring.  WBC ct was normal.  I was concerned about possible UTI.  Culture sent and she was started on abx while waiting for results.  It was negative so antibiotics were stopped.  Post op pain regimen was adjusted.  She has gotten the most improvement from flexeril .    Denies bleeding.  Urinary function normal.  Having some increased pain at time when has bloating.  She is related to eating and is not new.  She is going to reach out to her bariatric surgeon.  Does appear to have some malabsorption with low iron  and low ferritin.  Referral for iron  infusions made.  She is scheduled for this.    I'm not completely sure why she is having lower back issues.  Positioning was standard for surgery and legs were in low lithotomy positioning.  She's had issues in the past but has been several years.  This really doesn't appear to be post op pain.  We discussed sports medicine referral.  She has been seen in the past by Dr. Joane Sar in the past.  Referral will be placed.     MEDS:   Current Outpatient Medications on File Prior to Visit  Medication Sig Dispense Refill   albuterol  (VENTOLIN  HFA) 108 (90 Base) MCG/ACT inhaler Inhale 2 puffs into the lungs every 6 (six) hours as needed for wheezing or shortness of breath. (Patient taking differently: Inhale 2 puffs into the lungs every 6 (six) hours as needed for wheezing or shortness of breath.) 54 g 3   Calcium Carb-Cholecalciferol  (CALCIUM 500/D) 500-10 MG-MCG CHEW Chew by mouth 3 (three) times daily. Pt stated one chew every 2 hours (Patient taking differently: Chew by mouth 3 (three) times daily. Pt stated one chew every 2 hours)      cyclobenzaprine  (FLEXERIL ) 10 MG tablet Take 1 tablet (10 mg total) by mouth every 8 (eight) hours as needed for muscle spasms. 30 tablet 1   ferrous sulfate 324 MG TBEC Take 324 mg by mouth daily.     levocetirizine (XYZAL  ALLERGY 24HR) 5 MG tablet Take 1 tablet (5 mg total) by mouth every evening. 90 tablet 3   Multiple Vitamins-Minerals (BARIATRIC MULTIVITAMINS) CAPS Take 1 capsule by mouth in the morning, at noon, in the evening, and at bedtime. Pt stated takes on capsule 2 hours in between each capsule (Patient taking differently: Take 1 capsule by mouth in the morning, at noon, in the evening, and at bedtime. Pt stated takes on capsule 2 hours in between each capsule)     ondansetron  (ZOFRAN ) 4 MG tablet Take 1 tablet (4 mg total) by mouth every 6 (six) hours as needed for nausea. 15 tablet 0   oxyCODONE -acetaminophen  (PERCOCET/ROXICET) 5-325 MG tablet Take 1-2 tablets by mouth every 6 (six) hours as needed for moderate pain (pain score 4-6). 20 tablet 0   spironolactone  (ALDACTONE ) 25 MG tablet Take 1 tablet (25 mg total) by mouth daily. (Patient taking differently: Take 25 mg by mouth daily.) 90 tablet 3   valACYclovir  (VALTREX ) 1000 MG tablet TAKE 1 TABLET BY MOUTH EVERY DAY 90 tablet 3   Vitamin D , Ergocalciferol , (DRISDOL ) 1.25 MG (50000 UNIT)  CAPS capsule Take 1 capsule (50,000 Units total) by mouth every 7 (seven) days. (Patient taking differently: Take 50,000 Units by mouth every 7 (seven) days. Saturday's) 12 capsule 4   No current facility-administered medications on file prior to visit.    SH:  Smoking No    PHYSICAL EXAMINATION:    BP (!) 125/97 (BP Location: Right Arm, Patient Position: Sitting, Cuff Size: Large)   Pulse 75   Wt 205 lb (93 kg)   LMP 07/12/2023 (Exact Date)   BMI 36.31 kg/m     General appearance: alert, cooperative and appears stated age CV:  RRR without murmurs Lungs: CTA bilaterally Abdomen: soft, non-tender; bowel sounds normal; no masses,  no  organomegaly Incisions:  C/D/I   Assessment/Plan: 1. Post-op pain (Primary) - gabapentin  (NEURONTIN ) 300 MG capsule; Take 2 capsules (600 mg total) by mouth 3 (three) times daily as needed (pain).  Dispense: 60 capsule; Refill: 0  2. Chronic bilateral low back pain without sciatica - AMB referral to sports medicine

## 2023-08-18 ENCOUNTER — Ambulatory Visit: Payer: BC Managed Care – PPO

## 2023-08-18 VITALS — BP 93/67 | HR 63 | Temp 98.2°F | Resp 20 | Ht 63.0 in | Wt 207.6 lb

## 2023-08-18 DIAGNOSIS — Z9884 Bariatric surgery status: Secondary | ICD-10-CM

## 2023-08-18 DIAGNOSIS — D500 Iron deficiency anemia secondary to blood loss (chronic): Secondary | ICD-10-CM

## 2023-08-18 DIAGNOSIS — D509 Iron deficiency anemia, unspecified: Secondary | ICD-10-CM

## 2023-08-18 MED ORDER — IRON SUCROSE 20 MG/ML IV SOLN
200.0000 mg | Freq: Once | INTRAVENOUS | Status: AC
  Administered 2023-08-18: 200 mg via INTRAVENOUS
  Filled 2023-08-18: qty 10

## 2023-08-18 MED ORDER — DIPHENHYDRAMINE HCL 25 MG PO CAPS
25.0000 mg | ORAL_CAPSULE | Freq: Once | ORAL | Status: AC
Start: 2023-08-18 — End: 2023-08-18
  Administered 2023-08-18: 25 mg via ORAL
  Filled 2023-08-18: qty 1

## 2023-08-18 MED ORDER — ACETAMINOPHEN 325 MG PO TABS
650.0000 mg | ORAL_TABLET | Freq: Once | ORAL | Status: AC
  Administered 2023-08-18: 650 mg via ORAL
  Filled 2023-08-18: qty 2

## 2023-08-18 NOTE — Progress Notes (Signed)
Diagnosis: Iron Deficiency Anemia  Provider:  Chilton Greathouse MD  Procedure: IV Push  IV Type: Peripheral, IV Location: L Antecubital  Venofer (Iron Sucrose), Dose: 200 mg  Post Infusion IV Care: Observation period completed and Peripheral IV Discontinued  Discharge: Condition: Stable, Destination: Home . AVS Provided  Performed by:  Wyvonne Lenz, RN

## 2023-08-20 ENCOUNTER — Encounter: Payer: Self-pay | Admitting: Obstetrics & Gynecology

## 2023-08-25 ENCOUNTER — Ambulatory Visit (INDEPENDENT_AMBULATORY_CARE_PROVIDER_SITE_OTHER): Payer: BC Managed Care – PPO

## 2023-08-25 VITALS — BP 113/72 | HR 67 | Temp 98.3°F | Resp 16 | Ht 63.0 in | Wt 205.2 lb

## 2023-08-25 DIAGNOSIS — D500 Iron deficiency anemia secondary to blood loss (chronic): Secondary | ICD-10-CM

## 2023-08-25 DIAGNOSIS — Z9884 Bariatric surgery status: Secondary | ICD-10-CM

## 2023-08-25 DIAGNOSIS — D509 Iron deficiency anemia, unspecified: Secondary | ICD-10-CM

## 2023-08-25 MED ORDER — ACETAMINOPHEN 325 MG PO TABS
650.0000 mg | ORAL_TABLET | Freq: Once | ORAL | Status: AC
Start: 2023-08-25 — End: 2023-08-25
  Administered 2023-08-25: 650 mg via ORAL
  Filled 2023-08-25: qty 2

## 2023-08-25 MED ORDER — IRON SUCROSE 20 MG/ML IV SOLN
200.0000 mg | Freq: Once | INTRAVENOUS | Status: AC
  Administered 2023-08-25: 200 mg via INTRAVENOUS
  Filled 2023-08-25: qty 10

## 2023-08-25 MED ORDER — DIPHENHYDRAMINE HCL 25 MG PO CAPS
25.0000 mg | ORAL_CAPSULE | Freq: Once | ORAL | Status: AC
  Administered 2023-08-25: 25 mg via ORAL
  Filled 2023-08-25: qty 1

## 2023-08-25 NOTE — Progress Notes (Signed)
 Diagnosis: Iron Deficiency Anemia  Provider:  Chilton Greathouse MD  Procedure: IV Push  IV Type: Peripheral, IV Location: L Antecubital  Venofer (Iron Sucrose), Dose: 200 mg  Post Infusion IV Care: Observation period completed.  Discharge: Condition: Good, Destination: Home . AVS Declined  Performed by:  Wyvonne Lenz, RN

## 2023-08-29 ENCOUNTER — Ambulatory Visit (INDEPENDENT_AMBULATORY_CARE_PROVIDER_SITE_OTHER): Payer: Self-pay | Admitting: Obstetrics & Gynecology

## 2023-08-29 ENCOUNTER — Encounter (HOSPITAL_BASED_OUTPATIENT_CLINIC_OR_DEPARTMENT_OTHER): Payer: Self-pay | Admitting: Obstetrics & Gynecology

## 2023-08-29 VITALS — BP 125/91 | HR 72 | Ht 63.0 in | Wt 205.2 lb

## 2023-08-29 DIAGNOSIS — N3946 Mixed incontinence: Secondary | ICD-10-CM

## 2023-08-29 DIAGNOSIS — Z9889 Other specified postprocedural states: Secondary | ICD-10-CM

## 2023-08-29 DIAGNOSIS — E611 Iron deficiency: Secondary | ICD-10-CM

## 2023-08-29 DIAGNOSIS — M545 Low back pain, unspecified: Secondary | ICD-10-CM

## 2023-08-29 DIAGNOSIS — L680 Hirsutism: Secondary | ICD-10-CM | POA: Diagnosis not present

## 2023-08-29 DIAGNOSIS — G8929 Other chronic pain: Secondary | ICD-10-CM

## 2023-08-29 MED ORDER — GABAPENTIN 300 MG PO CAPS: 600.0000 mg | ORAL_CAPSULE | Freq: Three times a day (TID) | ORAL | 3 refills | Status: DC

## 2023-08-29 MED ORDER — CYCLOBENZAPRINE HCL 10 MG PO TABS: 10.0000 mg | ORAL_TABLET | Freq: Two times a day (BID) | ORAL | 3 refills | Status: DC

## 2023-08-29 NOTE — Progress Notes (Signed)
 GYNECOLOGY  VISIT  CC:   post op recheck  HPI: 43 y.o. G7P5025 Married White or Caucasian female here for recheck after undergoing TLH/bilateral salpingectomy/cystoscopy on 07/25/2023.  She reports bleeding is none.  She is still having some of the right sided and lower back issues.  Using gabapentin and flexeril.  Not taking narcotics or ibuprofen.  Bowel function is  normal .  Bladder function is normal for her but having incontinence.  Wears a mini pad all of the time.  Sometimes aware and sometimes not.  She does leak when she laughs or cough.  Often, she has no warning that she needs to void until she can barely hold it.  She does leak at night at well with not awareness.  Discussed starting pelvic PT and urogyn referral.  She reports these are not new but st  Also, unrelated, she has some concerns about hair growth.  Would like hormones checked.  Has just started spironolactone 25mg  daily.     MEDS:   Current Outpatient Medications on File Prior to Visit  Medication Sig Dispense Refill   albuterol (VENTOLIN HFA) 108 (90 Base) MCG/ACT inhaler Inhale 2 puffs into the lungs every 6 (six) hours as needed for wheezing or shortness of breath. (Patient taking differently: Inhale 2 puffs into the lungs every 6 (six) hours as needed for wheezing or shortness of breath.) 54 g 3   Calcium Carb-Cholecalciferol (CALCIUM 500/D) 500-10 MG-MCG CHEW Chew by mouth 3 (three) times daily. Pt stated one chew every 2 hours (Patient taking differently: Chew by mouth 3 (three) times daily. Pt stated one chew every 2 hours)     ferrous sulfate 324 MG TBEC Take 324 mg by mouth daily.     levocetirizine (XYZAL ALLERGY 24HR) 5 MG tablet Take 1 tablet (5 mg total) by mouth every evening. 90 tablet 3   Multiple Vitamins-Minerals (BARIATRIC MULTIVITAMINS) CAPS Take 1 capsule by mouth in the morning, at noon, in the evening, and at bedtime. Pt stated takes on capsule 2 hours in between each capsule (Patient taking  differently: Take 1 capsule by mouth in the morning, at noon, in the evening, and at bedtime. Pt stated takes on capsule 2 hours in between each capsule)     ondansetron (ZOFRAN) 4 MG tablet Take 1 tablet (4 mg total) by mouth every 6 (six) hours as needed for nausea. 15 tablet 0   spironolactone (ALDACTONE) 25 MG tablet Take 1 tablet (25 mg total) by mouth daily. (Patient taking differently: Take 25 mg by mouth daily.) 90 tablet 3   valACYclovir (VALTREX) 1000 MG tablet TAKE 1 TABLET BY MOUTH EVERY DAY 90 tablet 3   Vitamin D, Ergocalciferol, (DRISDOL) 1.25 MG (50000 UNIT) CAPS capsule Take 1 capsule (50,000 Units total) by mouth every 7 (seven) days. (Patient taking differently: Take 50,000 Units by mouth every 7 (seven) days. Saturday's) 12 capsule 4   No current facility-administered medications on file prior to visit.    SH:  Smoking No    PHYSICAL EXAMINATION:    BP (!) 125/91 (BP Location: Right Arm, Patient Position: Sitting, Cuff Size: Large)   Pulse 72   Ht 5\' 3"  (1.6 m)   Wt 205 lb 3.2 oz (93.1 kg)   BMI 36.35 kg/m     General appearance: alert, cooperative and appears stated age Abdomen: soft, non-tender; bowel sounds normal; no masses,  no organomegaly Incisions:  C/D/I  Pelvic: External genitalia:  no lesions  Urethra:  normal appearing urethra with no masses, tenderness or lesions              Bartholins and Skenes: normal                 Vagina: normal appearing vagina with normal color and discharge, no lesions, cuff well approximated              Cervix: absent              Bimanual Exam:  Uterus:  uterus absent              Adnexa: no mass, fullness, tenderness  Chaperone, Raechel Ache, RN, was present for exam.  Assessment/Plan: 1. Hirsutism (Primary) - will check some lab work today.  She is just started the 25mg  spironolactone so I feel ok to check testosterone today - Testosterone, Total, LC/MS/MS - Follicle stimulating hormone - Estradiol  2.  Mixed incontinence - advised nothing in vagina for 12 weeks post ope - Ambulatory referral to Urogynecology - Ambulatory referral to Physical Therapy  3. Chronic right-sided low back pain without sciatica - pt has been contacted by sports medicine.  Highly encouraged her to have follow up - gabapentin (NEURONTIN) 300 MG capsule; Take 2 capsules (600 mg total) by mouth 3 (three) times daily.  Dispense: 540 capsule; Refill: 3 - cyclobenzaprine (FLEXERIL) 10 MG tablet; Take 1 tablet (10 mg total) by mouth 2 (two) times daily.  Dispense: 180 tablet; Refill: 3  4. Post-operative state - pt is progressing well.  At this point, I feel important to proceed with evaluation of some more chronic problems she's been having.    5.  Iron deficiency - has received two iron infusions.  Has two more scheduled.  She will call when completed for me to place lab work in about 6 weeks.

## 2023-09-01 ENCOUNTER — Ambulatory Visit (INDEPENDENT_AMBULATORY_CARE_PROVIDER_SITE_OTHER): Payer: BC Managed Care – PPO

## 2023-09-01 VITALS — BP 111/71 | HR 65 | Temp 98.4°F | Resp 18 | Ht 63.0 in | Wt 206.6 lb

## 2023-09-01 DIAGNOSIS — D509 Iron deficiency anemia, unspecified: Secondary | ICD-10-CM

## 2023-09-01 DIAGNOSIS — Z9884 Bariatric surgery status: Secondary | ICD-10-CM

## 2023-09-01 DIAGNOSIS — D500 Iron deficiency anemia secondary to blood loss (chronic): Secondary | ICD-10-CM

## 2023-09-01 MED ORDER — ACETAMINOPHEN 325 MG PO TABS
650.0000 mg | ORAL_TABLET | Freq: Once | ORAL | Status: AC
  Administered 2023-09-01: 650 mg via ORAL
  Filled 2023-09-01: qty 2

## 2023-09-01 MED ORDER — DIPHENHYDRAMINE HCL 25 MG PO CAPS
25.0000 mg | ORAL_CAPSULE | Freq: Once | ORAL | Status: AC
  Administered 2023-09-01: 25 mg via ORAL
  Filled 2023-09-01: qty 1

## 2023-09-01 MED ORDER — IRON SUCROSE 20 MG/ML IV SOLN
200.0000 mg | Freq: Once | INTRAVENOUS | Status: AC
  Administered 2023-09-01: 200 mg via INTRAVENOUS
  Filled 2023-09-01: qty 10

## 2023-09-01 NOTE — Progress Notes (Signed)
 Diagnosis: Iron Deficiency Anemia  Provider:  Chilton Greathouse MD  Procedure: IV Push  IV Type: Peripheral, IV Location: L Antecubital  Venofer (Iron Sucrose), Vyepti (Eptinezumab-jjmr), Dose: 200 mg  Post Infusion IV Care: Observation period completed and Peripheral IV Discontinued  Discharge: Condition: Stable, Destination: Home . AVS Provided  Performed by:  Wyvonne Lenz, RN

## 2023-09-04 LAB — TESTOSTERONE, TOTAL, LC/MS/MS: Testosterone, total: 25.3 ng/dL

## 2023-09-04 LAB — FOLLICLE STIMULATING HORMONE: FSH: 9.2 m[IU]/mL

## 2023-09-04 LAB — ESTRADIOL: Estradiol: 74.8 pg/mL

## 2023-09-08 ENCOUNTER — Ambulatory Visit: Payer: BC Managed Care – PPO

## 2023-09-08 ENCOUNTER — Encounter: Payer: Self-pay | Admitting: Obstetrics & Gynecology

## 2023-09-08 VITALS — BP 123/81 | HR 67 | Temp 98.1°F | Resp 20 | Ht 63.0 in | Wt 206.4 lb

## 2023-09-08 DIAGNOSIS — D500 Iron deficiency anemia secondary to blood loss (chronic): Secondary | ICD-10-CM

## 2023-09-08 DIAGNOSIS — D509 Iron deficiency anemia, unspecified: Secondary | ICD-10-CM | POA: Diagnosis not present

## 2023-09-08 DIAGNOSIS — Z9884 Bariatric surgery status: Secondary | ICD-10-CM

## 2023-09-08 MED ORDER — IRON SUCROSE 20 MG/ML IV SOLN
200.0000 mg | Freq: Once | INTRAVENOUS | Status: AC
  Administered 2023-09-08: 200 mg via INTRAVENOUS
  Filled 2023-09-08: qty 10

## 2023-09-08 MED ORDER — DIPHENHYDRAMINE HCL 25 MG PO CAPS
25.0000 mg | ORAL_CAPSULE | Freq: Once | ORAL | Status: AC
Start: 2023-09-08 — End: 2023-09-08
  Administered 2023-09-08: 25 mg via ORAL
  Filled 2023-09-08: qty 1

## 2023-09-08 MED ORDER — ACETAMINOPHEN 325 MG PO TABS
650.0000 mg | ORAL_TABLET | Freq: Once | ORAL | Status: AC
  Administered 2023-09-08: 650 mg via ORAL
  Filled 2023-09-08: qty 2

## 2023-09-08 NOTE — Progress Notes (Signed)
 Diagnosis: Iron Deficiency Anemia  Provider:  Chilton Greathouse MD  Procedure: IV Push  IV Type: Peripheral, IV Location: L Antecubital  Venofer (Iron Sucrose), Dose: 200 mg  Post Infusion IV Care: Observation period completed and Peripheral IV Discontinued  Discharge: Condition: Good, Destination: Home . AVS Provided  Performed by:  Wyvonne Lenz, RN

## 2023-10-18 ENCOUNTER — Other Ambulatory Visit: Payer: Self-pay | Admitting: *Deleted

## 2023-10-18 MED ORDER — VALACYCLOVIR HCL 1 G PO TABS: 1000.0000 mg | ORAL_TABLET | Freq: Every day | ORAL | 3 refills | Status: AC

## 2023-12-29 ENCOUNTER — Encounter (HOSPITAL_COMMUNITY): Payer: Self-pay | Admitting: *Deleted

## 2024-01-16 ENCOUNTER — Encounter: Payer: Self-pay | Admitting: Family Medicine

## 2024-01-16 ENCOUNTER — Ambulatory Visit: Payer: BC Managed Care – PPO | Admitting: Family Medicine

## 2024-01-16 VITALS — BP 125/78 | HR 71 | Temp 99.0°F | Ht 63.0 in | Wt 215.8 lb

## 2024-01-16 DIAGNOSIS — R1013 Epigastric pain: Secondary | ICD-10-CM

## 2024-01-16 DIAGNOSIS — G4733 Obstructive sleep apnea (adult) (pediatric): Secondary | ICD-10-CM

## 2024-01-16 DIAGNOSIS — D500 Iron deficiency anemia secondary to blood loss (chronic): Secondary | ICD-10-CM | POA: Diagnosis not present

## 2024-01-16 DIAGNOSIS — M2550 Pain in unspecified joint: Secondary | ICD-10-CM

## 2024-01-16 DIAGNOSIS — E282 Polycystic ovarian syndrome: Secondary | ICD-10-CM

## 2024-01-16 DIAGNOSIS — R7989 Other specified abnormal findings of blood chemistry: Secondary | ICD-10-CM

## 2024-01-16 DIAGNOSIS — E88819 Insulin resistance, unspecified: Secondary | ICD-10-CM | POA: Diagnosis not present

## 2024-01-16 DIAGNOSIS — I10 Essential (primary) hypertension: Secondary | ICD-10-CM | POA: Diagnosis not present

## 2024-01-16 DIAGNOSIS — E538 Deficiency of other specified B group vitamins: Secondary | ICD-10-CM

## 2024-01-16 DIAGNOSIS — G43809 Other migraine, not intractable, without status migrainosus: Secondary | ICD-10-CM

## 2024-01-16 DIAGNOSIS — R5383 Other fatigue: Secondary | ICD-10-CM | POA: Insufficient documentation

## 2024-01-16 DIAGNOSIS — H60503 Unspecified acute noninfective otitis externa, bilateral: Secondary | ICD-10-CM

## 2024-01-16 LAB — IBC + FERRITIN
Ferritin: 8.8 ng/mL — ABNORMAL LOW (ref 10.0–291.0)
Iron: 83 ug/dL (ref 42–145)
Saturation Ratios: 16.7 % — ABNORMAL LOW (ref 20.0–50.0)
TIBC: 497 ug/dL — ABNORMAL HIGH (ref 250.0–450.0)
Transferrin: 355 mg/dL (ref 212.0–360.0)

## 2024-01-16 LAB — TSH: TSH: 2.92 u[IU]/mL (ref 0.35–5.50)

## 2024-01-16 LAB — FOLATE: Folate: 19.4 ng/mL (ref >5.9)

## 2024-01-16 LAB — SEDIMENTATION RATE: Sed Rate: 3 mm/h (ref 0–20)

## 2024-01-16 LAB — HEMOGLOBIN A1C: Hgb A1c MFr Bld: 5.1 % (ref 4.6–6.5)

## 2024-01-16 LAB — T3, FREE: T3, Free: 3.3 pg/mL (ref 2.3–4.2)

## 2024-01-16 LAB — VITAMIN B12: Vitamin B-12: 352 pg/mL (ref 211–911)

## 2024-01-16 LAB — T4, FREE: Free T4: 0.73 ng/dL (ref 0.60–1.60)

## 2024-01-16 LAB — C-REACTIVE PROTEIN: CRP: <1 mg/dL (ref 0.5–20.0)

## 2024-01-16 LAB — VITAMIN D 25 HYDROXY (VIT D DEFICIENCY, FRACTURES): VITD: 20.78 ng/mL — ABNORMAL LOW (ref 30.00–100.00)

## 2024-01-16 LAB — CORTISOL: Cortisol, Plasma: 6.7 ug/dL

## 2024-01-16 MED ORDER — KETOCONAZOLE 2 % EX CREA
1.0000 | TOPICAL_CREAM | Freq: Two times a day (BID) | CUTANEOUS | 0 refills | Status: DC
Start: 2024-01-16 — End: 2024-04-05

## 2024-01-16 MED ORDER — AMITRIPTYLINE HCL 25 MG PO TABS: 25.0000 mg | ORAL_TABLET | Freq: Every day | ORAL | 3 refills | Status: DC

## 2024-01-16 MED ORDER — CIPROFLOXACIN-DEXAMETHASONE 0.3-0.1 % OT SUSP: 4.0000 [drp] | Freq: Two times a day (BID) | OTIC | 0 refills | Status: DC

## 2024-01-16 NOTE — Assessment & Plan Note (Signed)
 Check vitamin D.

## 2024-01-16 NOTE — Progress Notes (Signed)
 Debra Miller is a 43 y.o. female who presents today for an office visit.  Assessment/Plan:  New/Acute Problems: Epigastric Abdominal Pain Concern for biliary colic based on description of symptoms.  She is status post gastric bypass a few years ago and however did not think that this is contributing at this point.  Will check labs and right upper quadrant ultrasound.  May need referral to GI or surgery depending on results of ultrasound.  Otitis Externa No red flags.  Will start Ciprodex  drops.  She will let us  know if not improving.  Candidal intertrigo No red flags.  Restart ketoconazole  cream.  Chronic Problems Addressed Today: Other fatigue Patient still reports persistent and daily chronic fatigue that makes it difficult for her to perform home activities and activities that she would like to do.  She was hoping that her symptoms would improve after her hysterectomy however she has not had much change in symptoms since then.  We will recheck labs today per her request though it is likely that her ongoing fatigue issues are multifactorial in setting of PCOS, OSA, iron  deficiency, etc.  Given her constellation of symptoms it is possible that she may have fibromyalgia however we are going to address her other ongoing issues as below first.   Essential hypertension Blood pressure at goal today.  She is on spironolactone  25 mg daily.  Low vitamin D  level Check vitamin D .   PCOS (polycystic ovarian syndrome) Patient did have hysterectomy several months ago and did relatively well with this but still having ongoing issues with acne and hirsutism.  She is currently on spironolactone  25 mg daily.  We are checking labs as above.  May need to increase her dose of spironolactone  as above though also advised her to follow-up with gynecology soon to discuss further management for this.  B12 deficiency Check B12.  OSA (obstructive sleep apnea) She has been off CPAP for the last year.  This  may be contributing to her ongoing issues with fatigue and polyarthralgia.  She is not sure if she needs to be on CPAP anymore.  Will place referral for her to have repeat sleep study done.  Insulin  resistance Check A1C with labs.  Polyarthralgia This is multifactorial.  She has follow-up with sports medicine in the past.  Her MRIs did show mild degenerative changes which is likely contributing though it is also likely that her above issues are contributing as well.  We discussed multiple causes for her polyarthralgia.  We are checking labs today as above.  Also treating her OSA which will hopefully help.  As mentioned above it is possible that she may have some component of fibromyalgia as well.  We will start amitriptyline  which will hopefully help with her chronic pain, insomnia, and migraines.  Will start 25 mg nightly.  She is aware of potential side effects.  She will follow-up last in a couple of weeks and we can adjust the dose as tolerated.  Iron  deficiency anemia due to chronic blood loss She is post hysterectomy and is no longer having ongoing blood loss.  Recheck iron  panel today.  Migraines This is also worsened recently.  Also likely due to her above issues as well.  We are starting amitriptyline  25 mg nightly and she will follow-up with us  in a couple of weeks and we can adjust the dose as tolerated.  Depending on response to amitriptyline  may consider going back on Emgality  as she did well with this previously.  Subjective:  HPI:  Patient here today for follow-up.  Last saw her 6 months ago.  At that time was found to have persistent iron  deficiency.  She was continued on iron  supplementation.  Since our last visit she reports that all of her symptoms have gotten worse.  She did have a hysterectomy several months ago and has done [place] well with this however she still has ongoing and persistent issues with diffuse pain.  She states that this is actually worsened since  her last visit.  She still has a lot of issues with ongoing fatigue and low energy.  Does not feel up to doing much of anything.  She does take Tylenol  PM to help with sleep at night which works modestly well although she does not think that she is getting good night sleep.  See A/P for status of chronic conditions.  She has a few additional issues that she would like to discuss today as well.  She over the last several months has had ongoing issues with upper abdominal pain.  This initially seem to be happening after eating though more recently seems to be happening randomly.  She feels a lot of bloating sensation and feels a contraction like she is in labor.  Symptoms will be intense for period of time and then suddenly release.  No specific treatment tried for this.  No obvious aggravating or relieving factors at this point.  She also has had some irritation in her ears and some drainage.  This is been going on for quite a while as well.  No reported hearing changes.  She also request refill on ketoconazole  cream for candidal intertrigo.       Objective:  Physical Exam: BP 125/78   Pulse 71   Temp 99 F (37.2 C) (Temporal)   Ht 5' 3 (1.6 m)   Wt 215 lb 12.8 oz (97.9 kg)   LMP 07/12/2023 (Exact Date)   SpO2 99%   BMI 38.23 kg/m   Wt Readings from Last 3 Encounters:  01/16/24 215 lb 12.8 oz (97.9 kg)  09/08/23 206 lb 6.4 oz (93.6 kg)  09/01/23 206 lb 9.6 oz (93.7 kg)    Gen: No acute distress, resting comfortably HEENT: Bilateral TMs clear.  EAC erythematous bilaterally.  No exudates. CV: Regular rate and rhythm with no murmurs appreciated Pulm: Normal work of breathing, clear to auscultation bilaterally with no crackles, wheezes, or rhonchi Abdomen: Soft, nontender, nondistended.  No rebound or guarding. Neuro: Grossly normal, moves all extremities Psych: Normal affect and thought content  Time Spent: 45 minutes of total time was spent on the date of the encounter performing  the following actions: chart review prior to seeing the patient, obtaining history, performing a medically necessary exam, counseling on the treatment plan, placing orders, and documenting in our EHR.        Worth HERO. Kennyth, MD 01/16/2024 11:31 AM

## 2024-01-16 NOTE — Assessment & Plan Note (Signed)
 Patient did have hysterectomy several months ago and did relatively well with this but still having ongoing issues with acne and hirsutism.  She is currently on spironolactone  25 mg daily.  We are checking labs as above.  May need to increase her dose of spironolactone  as above though also advised her to follow-up with gynecology soon to discuss further management for this.

## 2024-01-16 NOTE — Assessment & Plan Note (Signed)
 This is also worsened recently.  Also likely due to her above issues as well.  We are starting amitriptyline  25 mg nightly and she will follow-up with us  in a couple of weeks and we can adjust the dose as tolerated.  Depending on response to amitriptyline  may consider going back on Emgality  as she did well with this previously.

## 2024-01-16 NOTE — Assessment & Plan Note (Signed)
Check A1C with labs

## 2024-01-16 NOTE — Telephone Encounter (Signed)
 Information send to [facility], referral coordinator

## 2024-01-16 NOTE — Assessment & Plan Note (Signed)
 She has been off CPAP for the last year.  This may be contributing to her ongoing issues with fatigue and polyarthralgia.  She is not sure if she needs to be on CPAP anymore.  Will place referral for her to have repeat sleep study done.

## 2024-01-16 NOTE — Assessment & Plan Note (Signed)
 She is post hysterectomy and is no longer having ongoing blood loss.  Recheck iron  panel today.

## 2024-01-16 NOTE — Assessment & Plan Note (Signed)
 Patient still reports persistent and daily chronic fatigue that makes it difficult for her to perform home activities and activities that she would like to do.  She was hoping that her symptoms would improve after her hysterectomy however she has not had much change in symptoms since then.  We will recheck labs today per her request though it is likely that her ongoing fatigue issues are multifactorial in setting of PCOS, OSA, iron  deficiency, etc.  Given her constellation of symptoms it is possible that she may have fibromyalgia however we are going to address her other ongoing issues as below first.

## 2024-01-16 NOTE — Patient Instructions (Addendum)
 It was very nice to see you today!  I  am sorry to hear that your symptoms are not improving.  We will check blood work today to look for any electrolyte or hormone imbalances.  I will also check some inflammatory markers.  We will also check your iron , B12, vitamin D , and folate levels today.   I am concerned that she may be having gallstones.  We will check an ultrasound to confirm this.  Please start the drops to help with your ear irritation.  Please try the amitriptyline .  This will hopefully help with your sleep, pain, and headaches.  I will refill your ketoconazole .  Please come back to see me in 1 to 2 weeks.  Come back sooner if needed.  Return in about 2 weeks (around 01/30/2024).   Take care, Dr Kennyth  PLEASE NOTE:  If you had any lab tests, please let us  know if you have not heard back within a few days. You may see your results on mychart before we have a chance to review them but we will give you a call once they are reviewed by us .   If we ordered any referrals today, please let us  know if you have not heard from their office within the next week.   If you had any urgent prescriptions sent in today, please check with the pharmacy within an hour of our visit to make sure the prescription was transmitted appropriately.   Please try these tips to maintain a healthy lifestyle:  Eat at least 3 REAL meals and 1-2 snacks per day.  Aim for no more than 5 hours between eating.  If you eat breakfast, please do so within one hour of getting up.   Each meal should contain half fruits/vegetables, one quarter protein, and one quarter carbs (no bigger than a computer mouse)  Cut down on sweet beverages. This includes juice, soda, and sweet tea.   Drink at least 1 glass of water  with each meal and aim for at least 8 glasses per day  Exercise at least 150 minutes every week.

## 2024-01-16 NOTE — Assessment & Plan Note (Signed)
 Check B12

## 2024-01-16 NOTE — Assessment & Plan Note (Signed)
 Blood pressure at goal today.  She is on spironolactone  25 mg daily.

## 2024-01-16 NOTE — Assessment & Plan Note (Signed)
 This is multifactorial.  She has follow-up with sports medicine in the past.  Her MRIs did show mild degenerative changes which is likely contributing though it is also likely that her above issues are contributing as well.  We discussed multiple causes for her polyarthralgia.  We are checking labs today as above.  Also treating her OSA which will hopefully help.  As mentioned above it is possible that she may have some component of fibromyalgia as well.  We will start amitriptyline  which will hopefully help with her chronic pain, insomnia, and migraines.  Will start 25 mg nightly.  She is aware of potential side effects.  She will follow-up last in a couple of weeks and we can adjust the dose as tolerated.

## 2024-01-20 ENCOUNTER — Ambulatory Visit (HOSPITAL_BASED_OUTPATIENT_CLINIC_OR_DEPARTMENT_OTHER)
Admission: RE | Admit: 2024-01-20 | Discharge: 2024-01-20 | Disposition: A | Discharge status: Home / Self Care | Source: Ambulatory Visit | Attending: Family Medicine | Admitting: Family Medicine

## 2024-01-20 DIAGNOSIS — R1013 Epigastric pain: Secondary | ICD-10-CM | POA: Diagnosis present

## 2024-01-20 LAB — TESTOS,TOTAL,FREE AND SHBG (FEMALE)
Free Testosterone: 2.5 pg/mL (ref 0.1–6.4)
Sex Hormone Binding: 102 nmol/L (ref 17–124)
Testosterone, Total, LC-MS-MS: 31 ng/dL (ref 2–45)

## 2024-01-22 NOTE — Progress Notes (Signed)
 PROVIDER:  DEWARD PURCHASE STECHSCHULTE, MD  MRN: [id number] DOB: 08-Sep-1980 DATE OF ENCOUNTER: 01/23/2024 Interval History:      History of Present Illness Debra Miller is a 43 year old female who presents with severe abdominal pain. She is accompanied by her daughter.  She experiences severe abdominal pain located at the epigastric region, described as a constant crampy sensation with episodes of intense contraction-like pain lasting from five minutes to an hour and a half. These episodes are so severe that she sometimes has to pull over while driving. Both eating and fasting exacerbate her pain, and she frequently experiences nausea. She also has difficulty with bowel movements, bloating, and weight gain despite no changes in her activity level or diet.  The patient reports that an ultrasound was performed to evaluate her gallbladder due to her symptoms. She reports pain radiating into her neck and back. Her abdomen bloats severely, resembling pregnancy, even when fasting.  Additional symptoms include headaches, fatigue, insomnia, cold extremities, and occasional acid reflux. She also notes hair loss, which she attributes to rapid weight loss following surgery two and a half years ago. She takes a multivitamin and has been prescribed iron  supplements following a hysterectomy in January due to fluctuating iron  levels.      Physical Examination:   Physical Exam   General appearance - Consistent with stated age. Normal posture. Voice Normal. Mental status - alert and oriented Integumentary - No rash or lesion on limited exam Head - Normocephalic, atraumatic Face - Strength and tone intact Eyes - extraocular movement intact, sclera anicteric Chest - quiet, even and easy respiratory effort with no use of accessory muscles Neurological - able to articulate well with normal speech/language, rate, volume and coherence. Mood/affect - normal Judgement and insight -  insight is appropriate  concerning matters relevant to self and the patient displays appropriate judgment regarding every day activities. Thought Processes/Cognitive Function - aware of current events. Musculoskeletal - strength symmetrical throughout, no deformity Abdomen - Soft, tender epigastric area bilaterally  Physical Exam ABDOMEN: Tender on palpation.    Assessment and Plan:     Debra Miller is a 43 y.o. female who underwent previous gastric bypass who presents with abdominal pain consistent with billiary colic and a RUQ ultrasound with a gallbladder full of gallstones, consistent with cholecystitis.  Diagnoses and all orders for this visit:  Cholecystitis    Assessment & Plan Cholelithiasis with cholecystitis Severe, crampy epigastric pain consistent with cholelithiasis and cholecystitis. I reviewed ultrasound which shows gallbladder full of gallstones, correlating with symptoms of pain, bloating, and gastrointestinal discomfort. Pain exacerbated by eating, associated with difficulty in bowel movements, weight gain, and bloating. - Schedule laparoscopic cholecystectomy to alleviate symptoms - Perform procedure with four small incisions, using a camera and instruments to remove gallbladder - Avoid heavy lifting for four weeks post-surgery - Prescribe Percocet for post-operative pain management - Instruct no driving while taking Percocet - Advise against submerging incisions in water , but showers are permitted - Flush liver drainage system with contrast during surgery to check for stones - Check for internal hernias during surgery, especially given history of gastric bypass  Iron  deficiency anemia Iron  deficiency anemia managed with iron  supplementation following hysterectomy. Recent lab work indicates vitamins and minerals within normal limits, suggesting effective management.      Return for post op.  This note has been created using automated tools and reviewed for accuracy by PAUL JEFFREY  STECHSCHULTE.   The plan was discussed in detail with  the patient today, who expressed understanding.  The patient has my contact information, and understands to call me with any additional questions or concerns in the interval.  I would be happy to see the patient back sooner if the need arises.   PAUL JEFFREY STECHSCHULTE, MD  MBSAQIP Long Term Follow Up Questionnaire  (6 months post op and onward)  Diabetes mellitus, on medication: (If diet controlled or no medications, mark no): No GERD, on daily medication: no Hyperlipidemia, on medication: no Hypertension, on medication: no *Number of medications to treat hypertension:0 Sleep apnea requiring CPAP: no Weight: 213.2lb   Readmission: no Reoperation: no Intervention Related to Bariatric Procedure: no  If any of the previous 3 questions are answered yes, please explain the date, diagnosis and treatment.  If any of the above occurred outside [facility], please list the name of the facility, and request medical records to be scanned into [facility]:    Please document recent labwork including Creatinine, Albumin and A1C Most Recent Comprehensive metabolic panel with GFR: Rpt 07/17/23 12:25 Sodium: 138 07/18/23 10:17 Potassium: 3.7 07/18/23 10:17 Chloride: 106 07/18/23 10:17 CO2: 23 07/18/23 10:17 Glucose: 88 07/18/23 10:17 Mean Plasma Glucose: 99.67 05/14/21 09:20 BUN: 24 (H) 07/18/23 10:17 Creatinine: 0.47 07/18/23 10:17 Calcium: 8.3 (L) 07/18/23 10:17 Anion gap: 9 07/18/23 10:17 Alkaline Phosphatase: 65 07/17/23 12:25 Albumin: 4.4 07/17/23 12:25 AST: 18 07/17/23 12:25 ALT: 21 07/17/23 12:25 Total Protein: 7.0 07/17/23 12:25 Total Bilirubin: 0.2 07/17/23 12:25 GFR: 112.63 07/17/23 12:25 GFR, Estimated: >60 07/18/23 10:17   (H): Data is abnormally high (L): Data is abnormally low Rpt: View report in Results Review for more information Most Recent  Hemoglobin A1C: 5.1 01/16/24 11:01  Please update and document current medications    albuterol  (VENTOLIN  HFA) 108 (90 Base) MCG/ACT inhaler amitriptyline  (ELAVIL ) 25 MG tablet Calcium Carb-Cholecalciferol  (CALCIUM 500/D) 500-10 MG-MCG CHEW ciprofloxacin -dexamethasone  (CIPRODEX ) OTIC suspension cyclobenzaprine  (FLEXERIL ) 10 MG tablet ferrous sulfate 324 MG TBEC ketoconazole  (NIZORAL ) 2 % cream levocetirizine (XYZAL  ALLERGY 24HR) 5 MG tablet Multiple Vitamins-Minerals (BARIATRIC MULTIVITAMINS) CAPS ondansetron  (ZOFRAN ) 4 MG tablet spironolactone  (ALDACTONE ) 25 MG tablet valACYclovir  (VALTREX ) 1000 MG tablet Vitamin D , Ergocalciferol , (DRISDOL ) 1.25 MG (50000 UNIT) CAPS capsule

## 2024-01-24 LAB — EXTRA SPECIMEN

## 2024-01-24 LAB — VITAMIN B1: Vitamin B1 (Thiamine): 19 nmol/L (ref 8–30)

## 2024-01-24 LAB — ESTROGENS, TOTAL

## 2024-01-24 NOTE — Progress Notes (Deleted)
   LILLETTE Ileana Collet, PhD, LAT, ATC acting as a scribe for Artist Lloyd, MD.  Debra Miller is a 43 y.o. female who presents to [facility] today for exacerbation of her lumbar radiculopathy. Pt was last seen by Dr. Lloyd in 2021-22 and was referred to neurology.  Today, pt reports ***. Pt locates   Dx testing: 05/18/21 L-spine & t-spine MRI  Pertinent review of systems: ***  Relevant historical information: ***   Exam:  LMP 07/12/2023 (Exact Date)  General: Well Developed, well nourished, and in no acute distress.   MSK: ***    Lab and Radiology Results No results found for this or any previous visit (from the past 72 hours). No results found.     Assessment and Plan: 43 y.o. female with ***   PDMP not reviewed this encounter. No orders of the defined types were placed in this encounter.  No orders of the defined types were placed in this encounter.    Discussed warning signs or symptoms. Please see discharge instructions. Patient expresses understanding.   ***

## 2024-01-25 ENCOUNTER — Ambulatory Visit: Admitting: Family Medicine

## 2024-01-25 ENCOUNTER — Ambulatory Visit: Payer: Self-pay | Admitting: Family Medicine

## 2024-01-25 NOTE — Progress Notes (Signed)
 Her estrogen level was canceled-can we check with the lab to see what happened with this.  Her labs are notable for persistent iron  deficiency. Has she been taking an oral iron  supplement?  Her vitamin D  is still low.  Is she taking a vitamin D  supplement?  Recommend she take at least 5000 IUs daily if she is not doing so already.  All of her other labs are normal.

## 2024-01-26 ENCOUNTER — Other Ambulatory Visit: Payer: Self-pay | Admitting: *Deleted

## 2024-01-26 DIAGNOSIS — E282 Polycystic ovarian syndrome: Secondary | ICD-10-CM

## 2024-01-26 NOTE — Progress Notes (Signed)
Pt. Needs orders for surgery. 

## 2024-01-26 NOTE — Patient Instructions (Signed)
 SURGICAL WAITING ROOM VISITATION Patients having surgery or a procedure may have no more than 2 support people in the waiting area - these visitors may rotate in the visitor waiting room.   Due to an increase in RSV and influenza rates and associated hospitalizations, children ages 41 and under may not visit patients in [facility] hospitals. If the patient needs to stay at the hospital during part of their recovery, the visitor guidelines for inpatient rooms apply.  PRE-OP VISITATION  Pre-op nurse will coordinate an appropriate time for 1 support person to accompany the patient in pre-op.  This support person may not rotate.  This visitor will be contacted when the time is appropriate for the visitor to come back in the pre-op area.  Please refer to the [facility] website for the visitor guidelines for Inpatients (after your surgery is over and you are in a regular room).  You are not required to quarantine at this time prior to your surgery. However, you must do this: Hand Hygiene often Do NOT share personal items Notify your provider if you are in close contact with someone who has COVID or you develop fever 100.4 or greater, new onset of sneezing, cough, sore throat, shortness of breath or body aches.  If you test positive for Covid or have been in contact with anyone that has tested positive in the last 10 days please notify you surgeon.    Your procedure is scheduled on:  01/30/24  Report to [facility] Main Entrance: [place] entrance where the [employer] is available.   Report to admitting at: 1:15 PM  Call this number if you have any questions or problems the morning of surgery [phone]  FOLLOW ANY ADDITIONAL PRE OP INSTRUCTIONS YOU RECEIVED FROM YOUR SURGEON'S OFFICE!!!  Eat a light diet the day before surgery.  Examples including soups, broths, toast, yogurt, mashed potatoes.  Things to avoid include carbonated beverages (fizzy beverages), raw fruits and raw  vegetables, or beans.   If your bowels are filled with gas, your surgeon will have difficulty visualizing your pelvic organs which increases your surgical risks.  Do not eat food after Midnight the night prior to your surgery/procedure.  After Midnight you may have the following liquids until: 12:30 PM DAY OF SURGERY  Clear Liquid Diet Water  Black Coffee (sugar ok, NO MILK/CREAM OR CREAMERS)  Tea (sugar ok, NO MILK/CREAM OR CREAMERS) regular and decaf                             Plain Jell-O  with no fruit (NO RED)                                           Fruit ices (not with fruit pulp, NO RED)                                     Popsicles (NO RED)  Juice: NO CITRUS JUICES: only apple, WHITE grape, WHITE cranberry Sports drinks like Gatorade or Powerade (NO RED)   Oral Hygiene is also important to reduce your risk of infection.        Remember - BRUSH YOUR TEETH THE MORNING OF SURGERY WITH YOUR REGULAR TOOTHPASTE  Do NOT smoke after Midnight the night before surgery.  STOP TAKING all Vitamins, Herbs and supplements 1 week before your surgery.   Take ONLY these medicines the morning of surgery with A SIP OF WATER : valacyclovir .Use inhalers as usual and bring them  If You have been diagnosed with Sleep Apnea - Bring CPAP mask and tubing day of surgery. We will provide you with a CPAP machine on the day of your surgery.                   You may not have any metal on your body including hair pins, jewelry, and body piercing  Do not wear make-up, lotions, powders, perfumes / cologne, or deodorant  Do not wear nail polish including gel and S&S, artificial / acrylic nails, or any other type of covering on natural nails including finger and toenails. If you have artificial nails, gel coating, etc., that needs to be removed by a nail salon, Please have this removed prior to surgery. Not doing so may mean that your surgery  could be cancelled or delayed if the Surgeon or anesthesia staff feels like they are unable to monitor you safely.   Do not shave 48 hours prior to surgery to avoid nicks in your skin which may contribute to postoperative infections.   Contacts, Hearing Aids, dentures or bridgework may not be worn into surgery. DENTURES WILL BE REMOVED PRIOR TO SURGERY PLEASE DO NOT APPLY Poly grip OR ADHESIVES!!!  You may bring a small overnight bag with you on the day of surgery, only pack items that are not valuable. [facility] IS NOT RESPONSIBLE   FOR VALUABLES THAT ARE LOST OR STOLEN.   Patients discharged on the day of surgery will not be allowed to drive home.  Someone NEEDS to stay with you for the first 24 hours after anesthesia.  Do not bring your home medications to the hospital. The Pharmacy will dispense medications listed on your medication list to you during your admission in the Hospital.  Special Instructions: Bring a copy of your healthcare power of attorney and living will documents the day of surgery, if you wish to have them scanned into your [facility] Records- EPIC  Please read over the following fact sheets you were given: IF YOU HAVE QUESTIONS ABOUT YOUR PRE-OP INSTRUCTIONS, PLEASE CALL [phone]   [facility] - Preparing for Surgery Before surgery, you can play an important role.  Because skin is not sterile, your skin needs to be as free of germs as possible.  You can reduce the number of germs on your skin by washing with CHG (chlorahexidine gluconate) soap before surgery.  CHG is an antiseptic cleaner which kills germs and bonds with the skin to continue killing germs even after washing. Please DO NOT use if you have an allergy to CHG or antibacterial soaps.  If your skin becomes reddened/irritated stop using the CHG and inform your nurse when you arrive at Short Stay. Do not shave (including legs and underarms) for at least 48 hours prior to the first CHG shower.  You  may shave your face/neck.  Please follow these instructions carefully:  1.  Shower with CHG Soap  the night before surgery and the  morning of surgery.  2.  If you choose to wash your hair, wash your hair first as usual with your normal  shampoo.  3.  After you shampoo, rinse your hair and body thoroughly to remove the shampoo.                             4.  Use CHG as you would any other liquid soap.  You can apply chg directly to the skin and wash.  Gently with a scrungie or clean washcloth.  5.  Apply the CHG Soap to your body ONLY FROM THE NECK DOWN.   Do not use on face/ open                           Wound or open sores. Avoid contact with eyes, ears mouth and genitals (private parts).                       Wash face,  Genitals (private parts) with your normal soap.             6.  Wash thoroughly, paying special attention to the area where your  surgery  will be performed.  7.  Thoroughly rinse your body with warm water  from the neck down.  8.  DO NOT shower/wash with your normal soap after using and rinsing off the CHG Soap.            9.  Pat yourself dry with a clean towel.            10.  Wear clean pajamas.            11.  Place clean sheets on your bed the night of your first shower and do not  sleep with pets.  ON THE DAY OF SURGERY : Do not apply any lotions/deodorants the morning of surgery.  Please wear clean clothes to the hospital/surgery center.     FAILURE TO FOLLOW THESE INSTRUCTIONS MAY RESULT IN THE CANCELLATION OF YOUR SURGERY  PATIENT SIGNATURE_________________________________  NURSE SIGNATURE__________________________________  ________________________________________________________________________

## 2024-01-26 NOTE — Progress Notes (Signed)
 Ultrasound confirms gallstones.  As we discussed at her visit I believe this is probably where a lot of her abdominal pain is coming from.  It looks like she is already discussed with the surgeon and has a surgery scheduled.  She should let us  know if she needs any further assistance with this.

## 2024-01-29 ENCOUNTER — Encounter (HOSPITAL_COMMUNITY)
Admission: RE | Admit: 2024-01-29 | Discharge: 2024-01-29 | Disposition: A | Discharge status: Home / Self Care | Source: Ambulatory Visit | Attending: Surgery | Admitting: Surgery

## 2024-01-29 ENCOUNTER — Other Ambulatory Visit: Payer: Self-pay

## 2024-01-29 ENCOUNTER — Encounter (HOSPITAL_COMMUNITY): Payer: Self-pay

## 2024-01-29 VITALS — BP 142/84 | HR 71 | Temp 98.3°F | Ht 63.0 in | Wt 214.0 lb

## 2024-01-29 DIAGNOSIS — K8010 Calculus of gallbladder with chronic cholecystitis without obstruction: Secondary | ICD-10-CM | POA: Diagnosis present

## 2024-01-29 DIAGNOSIS — F319 Bipolar disorder, unspecified: Secondary | ICD-10-CM | POA: Insufficient documentation

## 2024-01-29 DIAGNOSIS — J45909 Unspecified asthma, uncomplicated: Secondary | ICD-10-CM | POA: Insufficient documentation

## 2024-01-29 DIAGNOSIS — E88819 Insulin resistance, unspecified: Secondary | ICD-10-CM | POA: Insufficient documentation

## 2024-01-29 DIAGNOSIS — Z01812 Encounter for preprocedural laboratory examination: Secondary | ICD-10-CM | POA: Insufficient documentation

## 2024-01-29 DIAGNOSIS — K819 Cholecystitis, unspecified: Secondary | ICD-10-CM | POA: Insufficient documentation

## 2024-01-29 DIAGNOSIS — Z90710 Acquired absence of both cervix and uterus: Secondary | ICD-10-CM | POA: Insufficient documentation

## 2024-01-29 DIAGNOSIS — I10 Essential (primary) hypertension: Secondary | ICD-10-CM | POA: Insufficient documentation

## 2024-01-29 DIAGNOSIS — Z9884 Bariatric surgery status: Secondary | ICD-10-CM | POA: Insufficient documentation

## 2024-01-29 DIAGNOSIS — G4733 Obstructive sleep apnea (adult) (pediatric): Secondary | ICD-10-CM | POA: Insufficient documentation

## 2024-01-29 DIAGNOSIS — E119 Type 2 diabetes mellitus without complications: Secondary | ICD-10-CM | POA: Diagnosis not present

## 2024-01-29 DIAGNOSIS — F411 Generalized anxiety disorder: Secondary | ICD-10-CM | POA: Diagnosis not present

## 2024-01-29 DIAGNOSIS — J4489 Other specified chronic obstructive pulmonary disease: Secondary | ICD-10-CM | POA: Diagnosis not present

## 2024-01-29 HISTORY — DX: Dyspnea, unspecified: R06.00

## 2024-01-29 LAB — CBC
HCT: 44 % (ref 36.0–46.0)
Hemoglobin: 14.5 g/dL (ref 12.0–15.0)
MCH: 30.5 pg (ref 26.0–34.0)
MCHC: 33 g/dL (ref 30.0–36.0)
MCV: 92.6 fL (ref 80.0–100.0)
Platelets: 234 K/uL (ref 150–400)
RBC: 4.75 MIL/uL (ref 3.87–5.11)
RDW: 12.2 % (ref 11.5–15.5)
WBC: 7.8 K/uL (ref 4.0–10.5)
nRBC: 0 % (ref 0.0–0.2)

## 2024-01-29 LAB — BASIC METABOLIC PANEL WITH GFR
Anion gap: 7 (ref 5–15)
BUN: 25 mg/dL — ABNORMAL HIGH (ref 6–20)
CO2: 24 mmol/L (ref 22–32)
Calcium: 9.2 mg/dL (ref 8.9–10.3)
Chloride: 106 mmol/L (ref 98–111)
Creatinine, Ser: 0.67 mg/dL (ref 0.44–1.00)
GFR, Estimated: >60 mL/min (ref >60)
Glucose, Bld: 94 mg/dL (ref 70–99)
Potassium: 3.7 mmol/L (ref 3.5–5.1)
Sodium: 137 mmol/L (ref 135–145)

## 2024-01-29 LAB — GLUCOSE, CAPILLARY: Glucose-Capillary: 97 mg/dL (ref 70–99)

## 2024-01-29 NOTE — Progress Notes (Signed)
 For Anesthesia: PCP - [name], MD  Cardiologist - N/A  Bowel Prep reminder:  Chest x-ray - CT cardiac: 02/08/21 EKG - 07/18/23 Stress Test -  ECHO - 02/08/21 Cardiac Cath -  Pacemaker/ICD device last checked: Pacemaker orders received: Device Rep notified:  Spinal Cord Stimulator:N/A  Sleep Study - Yes CPAP - NO  Fasting Blood Sugar - 100's Checks Blood Sugar __3-4  times per week. Date and result of last Hgb A1c-  Last dose of GLP1 agonist- N/A GLP1 instructions:   Last dose of SGLT-2 inhibitors- N/A SGLT-2 instructions:   Blood Thinner Instructions:N/A Aspirin Instructions: Last Dose:  Activity level: Can go up a flight of stairs and activities of daily living without stopping and without chest pain and/or shortness of breath   Able to exercise without chest pain and/or shortness of breath  Anesthesia review: Hx: HTN,DIA,OSA(NO CPAP).  Patient denies shortness of breath, fever, cough and chest pain at PAT appointment   Patient verbalized understanding of instructions that were reviewed over the telephone.

## 2024-01-29 NOTE — Anesthesia Preprocedure Evaluation (Signed)
 Anesthesia Evaluation  Patient identified by MRN, date of birth, ID band Patient awake    Reviewed: Allergy & Precautions, NPO status , Patient's Chart, lab work & pertinent test results  Airway Mallampati: II  TM Distance: >3 FB Neck ROM: Full    Dental no notable dental hx.    Pulmonary asthma , sleep apnea (no CPAP needed since gastric bypass) , COPD,  COPD inhaler   Pulmonary exam normal        Cardiovascular hypertension, Pt. on medications  Rhythm:Regular Rate:Normal  '22 ECHO: EF 60 to 65%.  1. The LV has normal function, no regional wall motion abnormalities. Left ventricular diastolic parameters were normal. The average left ventricular global longitudinal strain is -25.1 %.   2. RVF is normal. The right ventricular size is normal.   3. The mitral valve is normal in structure. No evidence of mitral valve regurgitation.   4. The aortic valve was not well visualized. Aortic valve regurgitation is not visualized. No aortic stenosis is present.     Neuro/Psych  Headaches  Anxiety Depression Bipolar Disorder      GI/Hepatic ,GERD  Medicated,,H/o gastric bypass   Endo/Other  diabetes  BMI 38 PCOS  Renal/GU      Musculoskeletal   Abdominal Normal abdominal exam  (+)   Peds  Hematology   Anesthesia Other Findings   Reproductive/Obstetrics                              Anesthesia Physical Anesthesia Plan  ASA: 3  Anesthesia Plan: General   Post-op Pain Management:    Induction: Intravenous  PONV Risk Score and Plan: 3 and Ondansetron , Dexamethasone  and Scopolamine  patch - Pre-op  Airway Management Planned: Oral ETT  Additional Equipment: None  Intra-op Plan:   Post-operative Plan: Extubation in OR  Informed Consent: I have reviewed the patients History and Physical, chart, labs and discussed the procedure including the risks, benefits and alternatives for the proposed  anesthesia with the patient or authorized representative who has indicated his/her understanding and acceptance.     Dental advisory given  Plan Discussed with: CRNA  Anesthesia Plan Comments: (See PAT note from 7/28)         Anesthesia Quick Evaluation

## 2024-01-29 NOTE — Progress Notes (Signed)
 Case: [id number] Date/Time: 01/30/24 1515   Procedure: LAPAROSCOPIC CHOLECYSTECTOMY   Anesthesia type: General   Diagnosis: Cholecystitis [K81.9]   Pre-op diagnosis: CHOLECYSTITIS   Location: [facility] ROOM 07 / [facility]   Surgeons: Lyndel Deward PARAS, MD       DISCUSSION: Debra Miller is a 42 yo female with PMH of HTN, asthma, mild OSA, migraines, s/p gastric bypass (2022), bipolar disorder, chronic back pain.  Had hysterectomy in 07/2023 which was uncomplicated.  Patient seen on 01/16/2024 by PCP.  Was having abdominal pain and concern for biliary colic.  Referred to general surgery and now scheduled for surgery above.  Patient also reported significant fatigue.  Blood work ordered which were WNL except for low Vit D.  Patient referred for repeat sleep study.  She no longer uses CPAP after weight loss.  VS: BP (!) 142/84   Pulse 71   Temp 36.8 C (Oral)   Ht 5' 3 (1.6 m)   Wt 97.1 kg   LMP 07/12/2023 (Exact Date)   SpO2 100%   BMI 37.91 kg/m   PROVIDERS: [name], MD   LABS: Labs reviewed: Acceptable for surgery. (all labs ordered are listed, but only abnormal results are displayed)  Labs Reviewed  BASIC METABOLIC PANEL WITH GFR - Abnormal; Notable for the following components:      Result Value   BUN 25 (*)    All other components within normal limits  CBC  GLUCOSE, CAPILLARY     IMAGES:   EKG 07/18/23:  Normal sinus rhythm, rate 68 Anterolateral infarct , age undetermined Abnormal ECG When compared with ECG of 22-Sep-2020 06:47, There is loss of voltage in the ateral precordial leads (consider lead misplacement, left pleural effusion or interval infarction)  CV: Echo 02/08/2021: IMPRESSIONS    1. Left ventricular ejection fraction, by estimation, is 60 to 65%. The left ventricle has normal function. The left ventricle has no regional wall motion abnormalities. Left ventricular diastolic parameters were normal. The average left ventricular global  longitudinal strain is -25.1 %.  2. Right ventricular systolic function is normal. The right ventricular size is normal. Tricuspid regurgitation signal is inadequate for assessing PA pressure.  3. The mitral valve is normal in structure. No evidence of mitral valve regurgitation.  4. The aortic valve was not well visualized. Aortic valve regurgitation is not visualized. No aortic stenosis is present.  5. The inferior vena cava is normal in size with greater than 50% respiratory variability, suggesting right atrial pressure of 3 mmHg.  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   History of type 2 diabetes mellitus    since gastric bypass 05-24-2021 ,  A1c normalized,  stopped meds 09/ 2023   Hyperlipidemia, mixed    Hypertension    IDA (iron  deficiency anemia)    MDD (major depressive disorder)    Menorrhagia with irregular cycle    Migraines    with aura   Mild intermittent asthma    Mild obstructive sleep apnea    (07-17-2023  pt stated was using cpap before and after gastric bypass surgery, but stopped after wt loss,  stated  is  rescheduled for retesting ) sleep study w/ dr dohmeier--- 12-27-2019 in epic mild osa, AHI 10/hr , recommmended cpap   PCOS (polycystic ovarian syndrome)    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 stechschulte;    roux-en-y   Seasonal allergies    Urine incontinence    Uterine leiomyoma    Wears glasses     Past Surgical History:  Procedure Laterality Date   CYSTOSCOPY N/A 07/25/2023   Procedure: CYSTOSCOPY;  Surgeon: Cleotilde Ronal RAMAN, MD;  [facility]: [facility];  Service: Gynecology;  Laterality: N/A;   DILATION AND EVACUATION  09/15/1999   @ [facility] by  dr nikki;  missed ab   KNEE ARTHROSCOPY Left 2018   LAPAROSCOPY WITH TUBAL LIGATION Bilateral 05/08/2007   @ [facility] by dr denton;   cautery used   RADIOLOGY WITH ANESTHESIA N/A 09/22/2020   Procedure: MRI WITH ANESTHESIA CERVICAL SPINE WITHOUT AND BRAIN WITHOUT;  Surgeon: Radiologist, Medication, MD;  [facility];  Service: Radiology;  Laterality: N/A;   RADIOLOGY WITH ANESTHESIA N/A 05/18/2021   Procedure: MRI WITH ANESTHESIA  THORACIC WITHOUT CONTRAST AND LUMBAR WITHOUT CONTRAST;  Surgeon: Radiologist, Medication, MD;  [facility];  Service: Radiology;  Laterality: N/A;   ROUX-EN-Y GASTRIC BYPASS  05/24/2021   @ [facility] by Dr. Lyndel;   Robot Assisted Roux-En-Y Gastric Bypass and Upper Endoscopy   TONSILLECTOMY AND ADENOIDECTOMY  1990   TOTAL LAPAROSCOPIC HYSTERECTOMY WITH SALPINGECTOMY Bilateral 07/25/2023   Procedure: TOTAL LAPAROSCOPIC HYSTERECTOMY WITH SALPINGECTOMY;  Surgeon: Cleotilde Ronal RAMAN, MD;  Location: [facility];  Service: Gynecology;  Laterality: Bilateral;   TYMPANOSTOMY TUBE PLACEMENT Bilateral    child    MEDICATIONS:  albuterol  (VENTOLIN  HFA) 108 (90 Base) MCG/ACT inhaler   amitriptyline  (ELAVIL ) 25 MG tablet   Calcium Carb-Cholecalciferol  (CALCIUM 500/D) 500-10 MG-MCG CHEW   ciprofloxacin -dexamethasone  (CIPRODEX ) OTIC suspension   cyclobenzaprine  (FLEXERIL ) 10 MG tablet   ketoconazole  (NIZORAL ) 2 % cream   levocetirizine (XYZAL  ALLERGY 24HR) 5 MG tablet   Multiple Vitamins-Minerals (BARIATRIC MULTIVITAMINS) CAPS   ondansetron  (ZOFRAN ) 4 MG tablet   spironolactone  (ALDACTONE ) 25 MG tablet   valACYclovir  (VALTREX ) 1000 MG tablet   Vitamin D , Ergocalciferol , (DRISDOL ) 1.25 MG (50000 UNIT) CAPS capsule   No current facility-administered medications for this encounter.   [name] [name] Short Stay/Anesthesiology [facility] Phone [phone] 01/29/2024 11:09 AM

## 2024-01-30 ENCOUNTER — Encounter (HOSPITAL_COMMUNITY)
Admission: RE | Disposition: A | Discharge status: Home / Self Care | Payer: Self-pay | Source: Home / Self Care | Attending: Surgery

## 2024-01-30 ENCOUNTER — Ambulatory Visit: Admitting: Family Medicine

## 2024-01-30 ENCOUNTER — Other Ambulatory Visit: Payer: Self-pay

## 2024-01-30 ENCOUNTER — Encounter (HOSPITAL_COMMUNITY): Payer: Self-pay | Admitting: Surgery

## 2024-01-30 ENCOUNTER — Encounter (HOSPITAL_COMMUNITY): Payer: Self-pay | Admitting: Medical

## 2024-01-30 ENCOUNTER — Ambulatory Visit (HOSPITAL_COMMUNITY)

## 2024-01-30 ENCOUNTER — Ambulatory Visit (HOSPITAL_COMMUNITY): Payer: Self-pay | Admitting: Anesthesiology

## 2024-01-30 ENCOUNTER — Ambulatory Visit (HOSPITAL_COMMUNITY)
Admission: RE | Admit: 2024-01-30 | Discharge: 2024-01-30 | Disposition: A | Discharge status: Home / Self Care | Attending: Surgery | Admitting: Surgery

## 2024-01-30 DIAGNOSIS — K8010 Calculus of gallbladder with chronic cholecystitis without obstruction: Secondary | ICD-10-CM | POA: Insufficient documentation

## 2024-01-30 DIAGNOSIS — J4489 Other specified chronic obstructive pulmonary disease: Secondary | ICD-10-CM | POA: Insufficient documentation

## 2024-01-30 DIAGNOSIS — F319 Bipolar disorder, unspecified: Secondary | ICD-10-CM | POA: Insufficient documentation

## 2024-01-30 DIAGNOSIS — Z9884 Bariatric surgery status: Secondary | ICD-10-CM | POA: Insufficient documentation

## 2024-01-30 DIAGNOSIS — I10 Essential (primary) hypertension: Secondary | ICD-10-CM | POA: Insufficient documentation

## 2024-01-30 DIAGNOSIS — E88819 Insulin resistance, unspecified: Secondary | ICD-10-CM

## 2024-01-30 DIAGNOSIS — E119 Type 2 diabetes mellitus without complications: Secondary | ICD-10-CM | POA: Insufficient documentation

## 2024-01-30 DIAGNOSIS — F411 Generalized anxiety disorder: Secondary | ICD-10-CM | POA: Insufficient documentation

## 2024-01-30 DIAGNOSIS — Z90710 Acquired absence of both cervix and uterus: Secondary | ICD-10-CM | POA: Insufficient documentation

## 2024-01-30 HISTORY — PX: CHOLECYSTECTOMY: SHX55

## 2024-01-30 LAB — GLUCOSE, CAPILLARY
Glucose-Capillary: 92 mg/dL (ref 70–99)
Glucose-Capillary: 167 mg/dL — ABNORMAL HIGH (ref 70–99)

## 2024-01-30 SURGERY — LAPAROSCOPIC CHOLECYSTECTOMY
Anesthesia: General

## 2024-01-30 MED ORDER — MEPERIDINE HCL 25 MG/ML IJ SOLN: 6.2500 mg | INTRAMUSCULAR | Status: DC | PRN

## 2024-01-30 MED ORDER — HYDROMORPHONE HCL 1 MG/ML IJ SOLN
INTRAMUSCULAR | Status: AC
Start: 2024-01-30 — End: 2024-01-30
  Filled 2024-01-30: qty 2

## 2024-01-30 MED ORDER — 0.9 % SODIUM CHLORIDE (POUR BTL) OPTIME
TOPICAL | Status: DC | PRN
  Administered 2024-01-30: 1000 mL

## 2024-01-30 MED ORDER — BUPIVACAINE-EPINEPHRINE 0.25% -1:200000 IJ SOLN
INTRAMUSCULAR | Status: DC | PRN
  Administered 2024-01-30: 30 mL

## 2024-01-30 MED ORDER — ROCURONIUM BROMIDE 100 MG/10ML IV SOLN
INTRAVENOUS | Status: DC | PRN
  Administered 2024-01-30: 70 mg via INTRAVENOUS
  Administered 2024-01-30: 30 mg via INTRAVENOUS

## 2024-01-30 MED ORDER — ACETAMINOPHEN 500 MG PO TABS
1000.0000 mg | ORAL_TABLET | Freq: Once | ORAL | Status: AC
  Administered 2024-01-30: 1000 mg via ORAL
  Filled 2024-01-30: qty 2

## 2024-01-30 MED ORDER — ORAL CARE MOUTH RINSE: 15.0000 mL | Freq: Once | OROMUCOSAL | Status: AC

## 2024-01-30 MED ORDER — ONDANSETRON HCL 4 MG/2ML IJ SOLN
INTRAMUSCULAR | Status: AC
  Filled 2024-01-30: qty 2

## 2024-01-30 MED ORDER — ONDANSETRON HCL 4 MG/2ML IJ SOLN
INTRAMUSCULAR | Status: DC | PRN
  Administered 2024-01-30: 4 mg via INTRAVENOUS

## 2024-01-30 MED ORDER — SUGAMMADEX SODIUM 200 MG/2ML IV SOLN
INTRAVENOUS | Status: AC
  Filled 2024-01-30: qty 2

## 2024-01-30 MED ORDER — MIDAZOLAM HCL 2 MG/2ML IJ SOLN
INTRAMUSCULAR | Status: AC
  Filled 2024-01-30: qty 2

## 2024-01-30 MED ORDER — CHLORHEXIDINE GLUCONATE 0.12 % MT SOLN
15.0000 mL | Freq: Once | OROMUCOSAL | Status: AC
  Administered 2024-01-30: 15 mL via OROMUCOSAL

## 2024-01-30 MED ORDER — LIDOCAINE HCL (CARDIAC) PF 100 MG/5ML IV SOSY
PREFILLED_SYRINGE | INTRAVENOUS | Status: DC | PRN
  Administered 2024-01-30: 60 mg via INTRAVENOUS

## 2024-01-30 MED ORDER — FENTANYL CITRATE (PF) 100 MCG/2ML IJ SOLN
INTRAMUSCULAR | Status: AC
  Filled 2024-01-30: qty 2

## 2024-01-30 MED ORDER — FENTANYL CITRATE (PF) 100 MCG/2ML IJ SOLN
INTRAMUSCULAR | Status: DC | PRN
  Administered 2024-01-30: 100 ug via INTRAVENOUS
  Administered 2024-01-30 (×2): 50 ug via INTRAVENOUS

## 2024-01-30 MED ORDER — HYDRALAZINE HCL 20 MG/ML IJ SOLN
10.0000 mg | Freq: Once | INTRAMUSCULAR | Status: AC
  Administered 2024-01-30: 10 mg via INTRAVENOUS

## 2024-01-30 MED ORDER — LACTATED RINGERS IV SOLN: INTRAVENOUS | Status: DC

## 2024-01-30 MED ORDER — FENTANYL CITRATE (PF) 100 MCG/2ML IJ SOLN
INTRAMUSCULAR | Status: AC
Start: 2024-01-30 — End: 2024-01-30
  Filled 2024-01-30: qty 2

## 2024-01-30 MED ORDER — HYDROMORPHONE HCL 1 MG/ML IJ SOLN
0.2500 mg | INTRAMUSCULAR | Status: DC | PRN
  Administered 2024-01-30 (×4): 0.5 mg via INTRAVENOUS

## 2024-01-30 MED ORDER — MIDAZOLAM HCL 2 MG/2ML IJ SOLN: 0.5000 mg | Freq: Once | INTRAMUSCULAR | Status: DC | PRN

## 2024-01-30 MED ORDER — SCOPOLAMINE 1 MG/3DAYS TD PT72
1.0000 | MEDICATED_PATCH | TRANSDERMAL | Status: DC
  Administered 2024-01-30: 1.5 mg via TRANSDERMAL
  Filled 2024-01-30: qty 1

## 2024-01-30 MED ORDER — OXYCODONE-ACETAMINOPHEN 5-325 MG PO TABS: 1.0000 | ORAL_TABLET | ORAL | 0 refills | Status: DC | PRN

## 2024-01-30 MED ORDER — PROPOFOL 10 MG/ML IV BOLUS
INTRAVENOUS | Status: DC | PRN
Start: 2024-01-30 — End: 2024-01-30
  Administered 2024-01-30: 200 mg via INTRAVENOUS

## 2024-01-30 MED ORDER — STERILE WATER FOR IRRIGATION IR SOLN
Status: DC | PRN
Start: 2024-01-30 — End: 2024-01-30
  Administered 2024-01-30: 1000 mL

## 2024-01-30 MED ORDER — CEFAZOLIN SODIUM-DEXTROSE 1-4 GM/50ML-% IV SOLN
INTRAVENOUS | Status: DC | PRN
  Administered 2024-01-30: 2 g via INTRAVENOUS

## 2024-01-30 MED ORDER — DEXAMETHASONE SODIUM PHOSPHATE 10 MG/ML IJ SOLN
INTRAMUSCULAR | Status: AC
  Filled 2024-01-30: qty 1

## 2024-01-30 MED ORDER — CEFAZOLIN SODIUM-DEXTROSE 2-4 GM/100ML-% IV SOLN
INTRAVENOUS | Status: AC
  Filled 2024-01-30: qty 100

## 2024-01-30 MED ORDER — LABETALOL HCL 5 MG/ML IV SOLN
INTRAVENOUS | Status: AC
  Filled 2024-01-30: qty 4

## 2024-01-30 MED ORDER — HYDRALAZINE HCL 20 MG/ML IJ SOLN
INTRAMUSCULAR | Status: AC
  Filled 2024-01-30: qty 1

## 2024-01-30 MED ORDER — LABETALOL HCL 5 MG/ML IV SOLN
INTRAVENOUS | Status: DC | PRN
  Administered 2024-01-30 (×2): 5 mg via INTRAVENOUS

## 2024-01-30 MED ORDER — LIDOCAINE HCL (PF) 2 % IJ SOLN
INTRAMUSCULAR | Status: AC
  Filled 2024-01-30: qty 5

## 2024-01-30 MED ORDER — DEXAMETHASONE SODIUM PHOSPHATE 10 MG/ML IJ SOLN
INTRAMUSCULAR | Status: DC | PRN
  Administered 2024-01-30: 5 mg via INTRAVENOUS

## 2024-01-30 MED ORDER — OXYCODONE HCL 5 MG PO TABS: 5.0000 mg | ORAL_TABLET | Freq: Once | ORAL | Status: DC | PRN

## 2024-01-30 MED ORDER — PROPOFOL 10 MG/ML IV BOLUS
INTRAVENOUS | Status: AC
  Filled 2024-01-30: qty 20

## 2024-01-30 MED ORDER — ROCURONIUM BROMIDE 10 MG/ML (PF) SYRINGE
PREFILLED_SYRINGE | INTRAVENOUS | Status: AC
  Filled 2024-01-30: qty 10

## 2024-01-30 MED ORDER — OXYCODONE HCL 5 MG/5ML PO SOLN: 5.0000 mg | Freq: Once | ORAL | Status: DC | PRN

## 2024-01-30 MED ORDER — SUGAMMADEX SODIUM 200 MG/2ML IV SOLN
INTRAVENOUS | Status: DC | PRN
  Administered 2024-01-30: 50 mg via INTRAVENOUS
  Administered 2024-01-30: 150 mg via INTRAVENOUS

## 2024-01-30 MED ORDER — MIDAZOLAM HCL 5 MG/5ML IJ SOLN
INTRAMUSCULAR | Status: DC | PRN
  Administered 2024-01-30: 2 mg via INTRAVENOUS

## 2024-01-30 MED ORDER — BUPIVACAINE-EPINEPHRINE (PF) 0.25% -1:200000 IJ SOLN
INTRAMUSCULAR | Status: AC
  Filled 2024-01-30: qty 30

## 2024-01-30 SURGICAL SUPPLY — 34 items
BAG COUNTER SPONGE SURGICOUNT (BAG) IMPLANT
CABLE HIGH FREQUENCY MONO STRZ (ELECTRODE) ×1 IMPLANT
CATH URETL OPEN 5X70 (CATHETERS) IMPLANT
CHLORAPREP W/TINT 26 (MISCELLANEOUS) ×1 IMPLANT
CLIP APPLIE ROT 10 11.4 M/L (STAPLE) ×1 IMPLANT
COVER MAYO STAND XLG (MISCELLANEOUS) ×1 IMPLANT
COVER SURGICAL LIGHT HANDLE (MISCELLANEOUS) ×1 IMPLANT
DERMABOND ADVANCED .7 DNX12 (GAUZE/BANDAGES/DRESSINGS) ×1 IMPLANT
DRAPE C-ARM 42X120 X-RAY (DRAPES) IMPLANT
ELECT REM PT RETURN 15FT ADLT (MISCELLANEOUS) ×1 IMPLANT
ENDOLOOP SUT PDS II 0 18 (SUTURE) ×1 IMPLANT
GLOVE BIO SURGEON STRL SZ7.5 (GLOVE) ×1 IMPLANT
GLOVE INDICATOR 8.0 STRL GRN (GLOVE) ×1 IMPLANT
GOWN STRL REUS W/ TWL XL LVL3 (GOWN DISPOSABLE) ×1 IMPLANT
GRASPER SUT TROCAR 14GX15 (MISCELLANEOUS) IMPLANT
HEMOSTAT SNOW SURGICEL 2X4 (HEMOSTASIS) IMPLANT
IRRIGATION SUCT STRKRFLW 2 WTP (MISCELLANEOUS) ×1 IMPLANT
IV CATH 14GX2 1/4 (CATHETERS) ×1 IMPLANT
KIT BASIN OR (CUSTOM PROCEDURE TRAY) ×1 IMPLANT
KIT TURNOVER KIT A (KITS) ×1 IMPLANT
NDL INSUFFLATION 14GA 120MM (NEEDLE) ×1 IMPLANT
NEEDLE INSUFFLATION 14GA 120MM (NEEDLE) ×1 IMPLANT
POUCH RETRIEVAL ECOSAC 10 (ENDOMECHANICALS) ×1 IMPLANT
SCISSORS LAP 5X35 DISP (ENDOMECHANICALS) ×1 IMPLANT
SET TUBE SMOKE EVAC HIGH FLOW (TUBING) ×1 IMPLANT
SLEEVE Z-THREAD 5X100MM (TROCAR) ×2 IMPLANT
SPIKE FLUID TRANSFER (MISCELLANEOUS) ×1 IMPLANT
STOPCOCK 4 WAY LG BORE MALE ST (IV SETS) IMPLANT
SUT MNCRL AB 4-0 PS2 18 (SUTURE) ×1 IMPLANT
SUT VIC AB 0 BRD 54 (SUTURE) IMPLANT
TOWEL OR 17X26 10 PK STRL BLUE (TOWEL DISPOSABLE) ×1 IMPLANT
TRAY LAPAROSCOPIC (CUSTOM PROCEDURE TRAY) ×1 IMPLANT
TROCAR ADV FIXATION 12X100MM (TROCAR) ×1 IMPLANT
TROCAR Z-THREAD OPTICAL 5X100M (TROCAR) ×1 IMPLANT

## 2024-01-30 NOTE — Transfer of Care (Signed)
 Immediate Anesthesia Transfer of Care Note  Patient: Debra Miller  Procedure(s) Performed: LAPAROSCOPIC CHOLECYSTECTOMY with INTRAOP CHOLANGIOGRAM  Patient Location: PACU  Anesthesia Type:General  Level of Consciousness: drowsy, patient cooperative, and responds to stimulation  Airway & Oxygen Therapy: Patient Spontanous Breathing and Patient connected to face mask oxygen  Post-op Assessment: Report given to RN and Post -op Vital signs reviewed and stable  Post vital signs: Reviewed and stable  Last Vitals:  Vitals Value Taken Time  BP 184/97 01/30/24 16:00  Temp    Pulse 76 01/30/24 16:02  Resp 28 01/30/24 16:02  SpO2 100 % 01/30/24 16:02  Vitals shown include unfiled device data.  Last Pain:  Vitals:   01/30/24 1311  TempSrc:   PainSc: 7       Patients Stated Pain Goal: 4 (01/30/24 1311)  Complications: No notable events documented.

## 2024-01-30 NOTE — Anesthesia Postprocedure Evaluation (Signed)
 Anesthesia Post Note  Patient: Debra Miller  Procedure(s) Performed: LAPAROSCOPIC CHOLECYSTECTOMY with INTRAOP CHOLANGIOGRAM     Patient location during evaluation: PACU Anesthesia Type: General Level of consciousness: awake and alert Pain management: pain level controlled Vital Signs Assessment: post-procedure vital signs reviewed and stable Respiratory status: spontaneous breathing, nonlabored ventilation, respiratory function stable and patient connected to nasal cannula oxygen Cardiovascular status: blood pressure returned to baseline and stable Postop Assessment: no apparent nausea or vomiting Anesthetic complications: no   No notable events documented.  Last Vitals:  Vitals:   01/30/24 1645 01/30/24 1722  BP: (!) 144/71 109/74  Pulse: 67 62  Resp: 17 16  Temp:  36.8 C  SpO2: 95% 97%    Last Pain:  Vitals:   01/30/24 1722  TempSrc: Oral  PainSc: 7                  Epifanio Lamar BRAVO

## 2024-01-30 NOTE — H&P (Signed)
 Admitting Physician: Deward PARAS Marylyn Appenzeller  Service: General surgery  CC: abdominal pain  Subjective   HPI: Debra Miller is an 43 y.o. 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   History of type 2 diabetes mellitus    since gastric bypass 05-24-2021 ,  A1c normalized,  stopped meds 09/ 2023   Hyperlipidemia, mixed    Hypertension    IDA (iron  deficiency anemia)    MDD (major depressive disorder)    Menorrhagia with irregular cycle    Migraines    with aura   Mild intermittent asthma    Mild obstructive sleep apnea    (07-17-2023  pt stated was using cpap before and after gastric bypass surgery, but stopped after wt loss,  stated  is rescheduled for retesting ) sleep study w/ dr dohmeier--- 12-27-2019 in epic mild osa, AHI 10/hr , recommmended cpap   PCOS (polycystic ovarian syndrome)    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 Nataleigh Griffin;    roux-en-y   Seasonal allergies    Urine incontinence    Uterine leiomyoma    Wears glasses     Past Surgical History:  Procedure Laterality Date   CYSTOSCOPY N/A 07/25/2023   Procedure: CYSTOSCOPY;  Surgeon: Cleotilde Ronal RAMAN, MD;  Location: [facility];  Service: Gynecology;  Laterality: N/A;   DILATION AND EVACUATION  09/15/1999   @ [facility] by dr nikki;  missed ab   KNEE ARTHROSCOPY Left 2018   LAPAROSCOPY WITH TUBAL LIGATION Bilateral 05/08/2007   @ [facility] by dr denton;   cautery used   RADIOLOGY WITH ANESTHESIA N/A 09/22/2020   Procedure: MRI WITH ANESTHESIA CERVICAL SPINE WITHOUT AND BRAIN WITHOUT;  Surgeon: Radiologist, Medication, MD;  [facility];  Service: Radiology;  Laterality: N/A;    RADIOLOGY WITH ANESTHESIA N/A 05/18/2021   Procedure: MRI WITH ANESTHESIA  THORACIC WITHOUT CONTRAST AND LUMBAR WITHOUT CONTRAST;  Surgeon: Radiologist, Medication, MD;  [facility];  Service: Radiology;  Laterality: N/A;   ROUX-EN-Y GASTRIC BYPASS  05/24/2021   @ [facility] by Dr. Lyndel;   Robot Assisted Roux-En-Y Gastric Bypass and Upper Endoscopy   TONSILLECTOMY AND ADENOIDECTOMY  1990   TOTAL LAPAROSCOPIC HYSTERECTOMY WITH SALPINGECTOMY Bilateral 07/25/2023   Procedure: TOTAL LAPAROSCOPIC HYSTERECTOMY WITH SALPINGECTOMY;  Surgeon: Cleotilde Ronal RAMAN, MD;  Location: [facility];  Service: Gynecology;  Laterality: Bilateral;   TYMPANOSTOMY TUBE PLACEMENT Bilateral    child    Family History  Problem Relation Age of Onset   Arthritis Mother    High Cholesterol Mother    Hypertension Mother    Diabetes Mother    Rheum arthritis Mother    Asthma Mother    Hyperlipidemia Mother    Migraines Mother    Thyroid  disease Mother    Colon polyps Mother    Arthritis Maternal Grandmother    Breast cancer Maternal Grandmother    High Cholesterol Maternal Grandmother    Stroke Maternal Grandmother    Hypertension Maternal Grandmother    Migraines Maternal Grandmother    Colon polyps Sister  Colon polyps Maternal Aunt    Colon cancer Neg Hx    Esophageal cancer Neg Hx    Rectal cancer Neg Hx    Stomach cancer Neg Hx     Social:  reports that she has never smoked. She has never used smokeless tobacco. She reports that she does not drink alcohol and does not use drugs.  Allergies:  Allergies  Allergen Reactions   Ceclor [Cefaclor] Hives   Nsaids Other (See Comments)    Has had bariatric sx and can not take these meds   Pediapred [Prednisolone Sodium Phosphate ] Hives    Medications: Current Outpatient Medications  Medication Instructions   albuterol  (VENTOLIN  HFA) 108 (90 Base) MCG/ACT inhaler 2 puffs, Inhalation, Every 6 hours PRN   amitriptyline  (ELAVIL ) 25 mg,  Oral, Daily at bedtime   Calcium Carb-Cholecalciferol  (CALCIUM 500/D) 500-10 MG-MCG CHEW 3 times daily   ciprofloxacin -dexamethasone  (CIPRODEX ) OTIC suspension 4 drops, Both EARS, 2 times daily   cyclobenzaprine  (FLEXERIL ) 10 mg, Oral, 2 times daily   ketoconazole  (NIZORAL ) 2 % cream 1 Application, Topical, 2 times daily   levocetirizine (XYZAL  ALLERGY 24HR) 5 mg, Oral, Every evening   Multiple Vitamins-Minerals (BARIATRIC MULTIVITAMINS) CAPS 1 capsule, 4 times daily   ondansetron  (ZOFRAN ) 4 mg, Oral, Every 6 hours PRN   spironolactone  (ALDACTONE ) 25 mg, Oral, Daily   valACYclovir  (VALTREX ) 1,000 mg, Oral, Daily   Vitamin D  (Ergocalciferol ) (DRISDOL ) 50,000 Units, Oral, Every 7 days    ROS - all of the below systems have been reviewed with the patient and positives are indicated with bold text General: chills, fever or night sweats Eyes: blurry vision or double vision ENT: epistaxis or sore throat Allergy/Immunology: itchy/watery eyes or nasal congestion Hematologic/Lymphatic: bleeding problems, blood clots or swollen lymph nodes Endocrine: temperature intolerance or unexpected weight changes Breast: new or changing breast lumps or nipple discharge Resp: cough, shortness of breath, or wheezing CV: chest pain or dyspnea on exertion GI: as per HPI GU: dysuria, trouble voiding, or hematuria MSK: joint pain or joint stiffness Neuro: TIA or stroke symptoms Derm: pruritus and skin lesion changes Psych: anxiety and depression  Objective   PE Blood pressure (!) 147/91, pulse 77, temperature 98.4 F (36.9 C), temperature source Oral, resp. rate 16, height 5' 3 (1.6 m), weight 97.1 kg, last menstrual period 07/12/2023, SpO2 98%. Constitutional: NAD; conversant; no deformities Eyes: Moist conjunctiva; no lid lag; anicteric; PERRL Neck: Trachea midline; no thyromegaly Lungs: Normal respiratory effort; no tactile fremitus CV: RRR; no palpable thrills; no pitting edema GI: Abd soft,  nontender; no palpable hepatosplenomegaly MSK: Normal range of motion of extremities; no clubbing/cyanosis Psychiatric: Appropriate affect; alert and oriented x3 Lymphatic: No palpable cervical or axillary lymphadenopathy  Results for orders placed or performed during the hospital encounter of 01/30/24 (from the past 24 hours)  Glucose, capillary     Status: None   Collection Time: 01/30/24  1:13 PM  Result Value Ref Range   Glucose-Capillary 92 70 - 99 mg/dL    Imaging Orders  No imaging studies ordered today     Assessment and Plan   ASHYA NICOLAISEN is an 43 y.o. female with abdominal pain, history of Roux-en-y gastric bypass and gallstones, here for laparoscopic cholecystectomy with intraoperative cholangiogram and exploration for internal hernia defects.  Cholelithiasis with cholecystitis Severe, crampy epigastric pain consistent with cholelithiasis and cholecystitis. I reviewed ultrasound which shows gallbladder full of gallstones, correlating with symptoms of pain, bloating, and gastrointestinal discomfort. Pain exacerbated by eating, associated  with difficulty in bowel movements, weight gain, and bloating. - Schedule laparoscopic cholecystectomy to alleviate symptoms - Perform procedure with four small incisions, using a camera and instruments to remove gallbladder - Avoid heavy lifting for four weeks post-surgery - Prescribe Percocet for post-operative pain management - Instruct no driving while taking Percocet - Advise against submerging incisions in water , but showers are permitted - Flush liver drainage system with contrast during surgery to check for stones - Check for internal hernias during surgery, especially given history of gastric bypass      ICD-10-CM   1. Essential hypertension  I10 CANCELED: CBC per protocol    CANCELED: Basic metabolic panel per protocol    2. Insulin  resistance  E88.819 CANCELED: CBG per Guidelines for Diabetes Management for Patients  Undergoing Surgery ([facility], [facility], and [facility] only)    CANCELED: CBG per protocol       Deward JINNY Foy, MD  [facility] Use [email or web address] to contact on call provider

## 2024-01-30 NOTE — Discharge Instructions (Signed)
 CHOLECYSTECTOMY POST OPERATIVE INSTRUCTIONS  Thinking Clearly  The anesthesia may cause you to feel different for 1 or 2 days. Do not drive, drink alcohol, or make any big decisions for at least 2 days.  Nutrition When you wake up, you will be able to drink small amounts of liquid. If you do not feel sick, you can slowly advance your diet to regular foods. Continue to drink lots of fluids, usually about 8 to 10 glasses per day. Eat a high-fiber diet so you don't strain during bowel movements. High-Fiber Foods Foods high in fiber include beans, bran cereals and whole-grain breads, peas, dried fruit (figs, apricots, and dates), raspberries, blackberries, strawberries, sweet corn, broccoli, baked potatoes with skin, plums, pears, apples, greens, and nuts. Activity Slowly increase your activity. Be sure to get up and walk every hour or so to prevent blood clots. No heavy lifting or strenuous activity for 4 weeks following surgery to prevent hernias at your incision sites It is normal to feel tired. You may need more sleep than usual.  Get your rest but make sure to get up and move around frequently to prevent blood clots and pneumonia.  Work and Return to [employer] can go back to work when you feel well enough. Discuss the timing with your surgeon. You can usually go back to school or work 1 week after an operation. If your work requires heavy lifting or strenuous activity you need to be placed on light duty for 4 weeks following surgery. You can return to gym class, sports or other physical activities 4 weeks after surgery.  Wound Care Always wash your hands before and after touching near your incision site. Do not soak in a bathtub until cleared at your follow up appointment. You may take a shower 24 hours after surgery. A small amount of drainage from the incision is normal. If the drainage is thick and yellow or the site is red, you may have an infection, so call your surgeon. If you  have a drain in one of your incisions, it will be taken out in office when the drainage stops. Steri-Strips will fall off in 7 to 10 days or they will be removed during your first office visit. If you have dermabond glue covering over the incision, allow the glue to flake off on its own. Avoid wearing tight or rough clothing. It may rub your incisions and make it harder for them to heal. Protect the new skin, especially from the sun. The sun can burn and cause darker scarring. Your scar will heal in about 4 to 6 weeks and will become softer and continue to fade over the next year.  The cosmetic appearance of the incisions will improve over the course of the first year after surgery. Sensation around your incision will return in a few weeks or months.  Bowel Movements After intestinal surgery, you may have loose watery stools for several days. If watery diarrhea lasts longer than 3 days, contact your surgeon. Pain medication (narcotics) can cause constipation. Increase the fiber in your diet with high-fiber foods if you are constipated. You can take an over the counter stool softener like Colace to avoid constipation.  Additional over the counter medications can also be used if Colace isn't sufficient (for example, Milk of Magnesia or Miralax).  Pain The amount of pain is different for each person. Some people need only 1 to 3 doses of pain control medication, while others need more. Take alternating doses of tylenol  and ibuprofen around the clock for the first five days following surgery.  This will provide a baseline of pain control and help with inflammation.  Take the narcotic pain medication in addition if needed for severe pain.  Contact Your Surgeon at [phone], if you have: Pain in your right upper abdomen like a gallbladder attack. Pain that will not go away Pain that gets worse A fever of more than 101F (38.3C) Repeated vomiting Swelling, redness, bleeding, or bad-smelling  drainage from your wound site Strong abdominal pain No bowel movement or unable to pass gas for 3 days Watery diarrhea lasting longer than 3 days  Pain Control The goal of pain control is to minimize pain, keep you moving and help you heal. Your surgical team will work with you on your pain plan. Most often a combination of therapies and medications are used to control your pain. You may also be given medication (local anesthetic) at the surgical site. This may help control your pain for several days. Extreme pain puts extra stress on your body at a time when your body needs to focus on healing. Do not wait until your pain has reached a level "10" or is unbearable before telling your doctor or nurse. It is much easier to control pain before it becomes severe. Following a laparoscopic procedure, pain is sometimes felt in the shoulder. This is due to the gas inserted into your abdomen during the procedure. Moving and walking helps to decrease the gas and the right shoulder pain.  Use the guide below for ways to manage your post-operative pain. Learn more by going to facs.org/safepaincontrol.  How Intense Is My Pain Common Therapies to Feel Better       I hardly notice my pain, and it does not interfere with my activities.  I notice my pain and it distracts me, but I can still do activities (sitting up, walking, standing).  Non-Medication Therapies  Ice (in a bag, applied over clothing at the surgical site), elevation, rest, meditation, massage, distraction (music, TV, play) walking and mild exercise Splinting the abdomen with pillows +  Non-Opioid Medications Acetaminophen (Tylenol) Non-steroidal anti-inflammatory drugs (NSAIDS) Aspirin, Ibuprofen (Motrin, Advil) Naproxen (Aleve) Take these as needed, when you feel pain. Both acetaminophen and NSAIDs help to decrease pain and swelling (inflammation).      My pain is hard to ignore and is more noticeable even when I rest.  My  pain interferes with my usual activities.  Non-Medication Therapies  +  Non-Opioid medications  Take on a regular schedule (around-the-clock) instead of as needed. (For example, Tylenol every 6 hours at 9:00 am, 3:00 pm, 9:00 pm, 3:00 am and Motrin every 6 hours at 12:00 am, 6:00 am, 12:00 pm, 6:00 pm)         I am focused on my pain, and I am not doing my daily activities.  I am groaning in pain, and I cannot sleep. I am unable to do anything.  My pain is as bad as it could be, and nothing else matters.  Non-Medication Therapies  +  Around-the-Clock Non-Opioid Medications  +  Short-acting opioids  Opioids should be used with other medications to manage severe pain. Opioids block pain and give a feeling of euphoria (feel high). Addiction, a serious side effect of opioids, is rare with short-term (a few days) use.  Examples of short-acting opioids include: Tramadol (Ultram), Hydrocodone (Norco, Vicodin), Hydromorphone (Dilaudid), Oxycodone (Oxycontin)     The above directions have been adapted from  the [employer] of [employer] Surgical Patient Education Program.  Please refer to the ACS website if needed: [email or web address].   Ivar Drape, MD [facility] [address], [address], [place], [place]  [address] ?  [address] [address], [place], [place]   [address] [phone] ? [phone] ? FAX [phone] Web site: [email or web address]

## 2024-01-30 NOTE — Anesthesia Procedure Notes (Signed)
 Procedure Name: Intubation Date/Time: 01/30/2024 2:17 PM  Performed by: Belvie Valri NOVAK, CRNAPre-anesthesia Checklist: Patient identified, Emergency Drugs available, Suction available and Patient being monitored Patient Re-evaluated:Patient Re-evaluated prior to induction Oxygen Delivery Method: Circle System Utilized Preoxygenation: Pre-oxygenation with 100% oxygen Induction Type: IV induction Ventilation: Mask ventilation without difficulty Laryngoscope Size: 3 and Glidescope Grade View: Grade I Tube type: Oral Tube size: 7.0 mm Number of attempts: 1 Airway Equipment and Method: Stylet and Oral airway Placement Confirmation: ETT inserted through vocal cords under direct vision, positive ETCO2 and breath sounds checked- equal and bilateral Secured at: 22 cm Tube secured with: Tape Dental Injury: Teeth and Oropharynx as per pre-operative assessment  Comments: Pt c/o cervical spine pain. Glidescope used with minimal neck extension

## 2024-01-30 NOTE — Op Note (Signed)
 Patient: Debra Miller (1980-08-24, [id number])  Date of Surgery: 01/30/2024  Preoperative Diagnosis: CHOLECYSTITIS   Postoperative Diagnosis: CHOLECYSTITIS, adhesions between roux limb and jejunojejunostomy   Surgical Procedure: LAPAROSCOPIC CHOLECYSTECTOMY with INTRAOP CHOLANGIOGRAM: 52437 (CPT)    Operative Team Members:  Surgeons and Role:    * Thorvald Orsino, Deward PARAS, MD - Primary   Anesthesiologist: Epifanio Charleston, MD; Dorethea Cordella SQUIBB, DO CRNA: Nada Corean LITTIE, CRNA; Belvie Valri NOVAK, CRNA   Anesthesia: General   Fluids:  Total I/O In: 1100 [I.V.:1000; IV Piggyback:100] Out: 10 [Blood:10]  Complications: None  Drains:  none   Specimen:  ID Type Source Tests Collected by Time Destination  1 : Gallbladder Tissue PATH Gallbladder SURGICAL PATHOLOGY Ein Rijo, Deward PARAS, MD 01/30/2024 1512      Disposition:  PACU - hemodynamically stable.  Plan of Care: Discharge to home after PACU    Indications for Procedure: Debra Miller is a 43 y.o. female who presented with abdominal pain after gastric bypass.  History, physical and imaging was concerning for cholecystitis.  Laparoscopic cholecystectomy was recommended for the patient.  The procedure itself, as well as the risks, benefits and alternatives were discussed with the patient.  Risks discussed included but were not limited to the risk of infection, bleeding, damage to nearby structures, need to convert to open procedure, incisional hernia, bile leak, common bile duct injury and the need for additional procedures or surgeries.  With this discussion complete and all questions answered the patient granted consent to proceed.  Findings: Normal cholangiogram other than short cystic duct branching off right hepatic duct. No choledocholithiasis identified.  Some adhesions between roux limb mesentery and jejunojejunostomy were lysed sharply.  Infection status: Patient: Private Patient Elective Case Case:  Urgent Infection Present At Time Of Surgery (PATOS): Infected gallbladder   Description of Procedure:   On the date stated above, the patient was taken to the operating room suite and placed in supine positioning.  Sequential compression devices were placed on the lower extremities to prevent blood clots.  General endotracheal anesthesia was induced. Preoperative antibiotics were given.  The patient's abdomen was prepped and draped in the usual sterile fashion.  A time-out was completed verifying the correct patient, procedure, positioning and equipment needed for the case.  We began by anesthetizing the skin with local anesthetic and then making a 5 mm incision just below the umbilicus.  We dissected through the subcutaneous tissues to the fascia.  The fascia was grasped and elevated using a Kocher clamp.  A Veress needle was inserted into the abdomen and the abdomen was insufflated to 15 mmHg.  A 5 mm trocar was inserted in this position under optical guidance and then the abdomen was inspected.  There was no trauma to the underlying viscera with initial trocar placement.  Any abnormal findings, other than inflammation in the right upper quadrant, are listed above in the findings section.  Three additional trocars were placed, one 12 mm trocar in the subxiphoid position, one 5 mm trocar in the midline epigastric area and one 5mm trocar in the right upper quadrant subcostally.  These were placed under direct vision without any trauma to the underlying viscera.    The patient was then placed in head up, left side down positioning.  The gallbladder was identified and dissected free from its attachments to the omentum allowing the duodenum to fall away.  The infundibulum of the gallbladder was dissected free working laterally to medially.  The cystic duct and cystic  artery were dissected free from surrounding connective tissue.  The infundibulum of the gallbladder was dissected off the cystic plate.  A  critical view of safety was obtained with the cystic duct and cystic artery being cleared of connective tissues and clearly the only two structures entering into the gallbladder with the liver clearly visible behind.  One clip was applied high on the cystic duct.  A small ductotomy was created below this using the endoscopic shears.  A cholangiogram catheter was introduced through the abdominal wall and into the cystic duct through this ductotomy.  The catheter was positioned using the olsen cholangiogram clamp.  The catheter was flushed to ensure no leakage.  We then removed the laparoscopic instruments and positioned the C-Arm to perform a cholangiogram.  The catheter was flushed with contrast under fluoroscopic visualization and a cholangiogram was obtained.  The cholangiogram visualized the biliary tree from the ampulla up to the first two biliary radicals in the liver.  There were no filling defects identified.  The catheter clearly entered the cystic duct.  The cystic duct branched off the right hepatic duct.  There was gradual tapering of the common bile duct down to the ampulla without evidence of stricture or other abnormalities.  Please see the EMR for saved representative images.  With our cholangiogram compete, we moved the c-arm away from the field and returned to laparoscopic surgery.    Clips were then applied to the cystic duct and cystic artery and then these structures were divided.  A PDS endoloop was placed to secure the cystic duct.  I was careful not to narrow the right hepatic duct as the cystic duct was quite short.  The gallbladder was dissected off the cystic plate, placed in an endocatch bag and removed from the 12 mm subxiphoid port site.  The clips were inspected and appeared effective.  The cystic plate was inspected and hemostasis was obtained using electrocautery.  A suction irrigator was used to clean the operative field.    As the patient had had a previous gastric bypass, I  then inspected the mesenteric defects.  The retrograde defect was closed and well scarred.  The jejunojejunostomy was identified by running the Roux limb from the pouch down to the anastomosis.  There was some scar tissue between the jejunojejunostomy common enterotomy staple line and the Roux limb which was lysed sharply using laparoscopic shears.  I then was able to identify the hepatobiliary limb, common channel, and Roux limb entering the jejunojejunostomy.  The mesentery was inspected and the mesenteric defect was scarred and closed with no internal defect.  It is possible the scar tissue may have caused some symptoms but at this point has been lysed.  Attention was turned to closure.  The 12 mm subxiphoid port site was closed using a 0-vicryl suture on a fascial suture passer.  The abdomen was desufflated.  The skin was closed using 4-0 monocryl and dermabond.  All sponge and needle counts were correct at the conclusion of the case.    Deward Foy, MD General, Bariatric, & Minimally Invasive Surgery [facility], [place]

## 2024-01-31 ENCOUNTER — Encounter (HOSPITAL_COMMUNITY): Payer: Self-pay | Admitting: Surgery

## 2024-02-01 LAB — SURGICAL PATHOLOGY

## 2024-02-05 ENCOUNTER — Ambulatory Visit: Admitting: Family Medicine

## 2024-02-19 ENCOUNTER — Ambulatory Visit: Admitting: Family Medicine

## 2024-02-19 ENCOUNTER — Encounter: Payer: Self-pay | Admitting: Family Medicine

## 2024-02-19 VITALS — BP 127/80 | HR 74 | Temp 98.2°F | Ht 63.0 in | Wt 231.4 lb

## 2024-02-19 DIAGNOSIS — R5383 Other fatigue: Secondary | ICD-10-CM | POA: Diagnosis not present

## 2024-02-19 DIAGNOSIS — L732 Hidradenitis suppurativa: Secondary | ICD-10-CM | POA: Insufficient documentation

## 2024-02-19 DIAGNOSIS — M2550 Pain in unspecified joint: Secondary | ICD-10-CM | POA: Diagnosis not present

## 2024-02-19 DIAGNOSIS — G4733 Obstructive sleep apnea (adult) (pediatric): Secondary | ICD-10-CM

## 2024-02-19 DIAGNOSIS — G43809 Other migraine, not intractable, without status migrainosus: Secondary | ICD-10-CM | POA: Diagnosis not present

## 2024-02-19 DIAGNOSIS — H6090 Unspecified otitis externa, unspecified ear: Secondary | ICD-10-CM

## 2024-02-19 DIAGNOSIS — I10 Essential (primary) hypertension: Secondary | ICD-10-CM

## 2024-02-19 MED ORDER — DOXYCYCLINE HYCLATE 100 MG PO TABS: 100.0000 mg | ORAL_TABLET | Freq: Two times a day (BID) | ORAL | 0 refills | Status: DC

## 2024-02-19 NOTE — Progress Notes (Signed)
 Debra Miller is a 43 y.o. female who presents today for an office visit.  Assessment/Plan:  New/Acute Problems: Epigastric Abdominal Pain  Improving status post cholecystectomy couple weeks ago.  Still having occasional upper abdominal pain may be related to recent surgery.  No red flag signs or symptoms.  Will defer further management to her surgical team.  We discussed reasons to return to care.  Otitis Externa  Symptoms did not fully improve with Ciprodex  drops though did have some improvement.  Still has some slight otitis externa on exam today.  Will be starting doxycycline  for her hidradenitis as below though will refer to ENT for further evaluation and management.  Chronic Problems Addressed Today: Hidradenitis suppurativa This has been a chronic ongoing issue for many years.  Currently has a flareup in the right axilla.  Will start doxycycline  and refer her to dermatology.  Essential hypertension At goal today on spironolactone  25 mg daily.  OSA (obstructive sleep apnea) We referred her to sleep clinic at her last visit however she was not able to schedule a visit.  Will place another referral today.  Polyarthralgia No significant change since her last visit.  This is likely multifactorial in nature with known degenerative changes.  May have some component of fibromyalgia as well.    She did take about a week of amitriptyline  though did not note much of a benefit.  She is agreeable to restart.  She discontinued due to her recent cholecystectomy.  Did discuss with patient restarting a low dose and may not notice much of a benefit at such a low dose.  She is aware of potential side effects.  She will follow-up with us  in a month and we can adjust the dose as needed.  Migraines We are reinitiating amitriptyline  25 mg nightly.  She will follow-up with us  in a month.  Depending on response to this may consider trial of Emgality  or Aimovig.  Other fatigue Multifactorial.  We are  addressing her OSA as above.     Subjective:  HPI:  See assessment / plan for status of chronic conditions.  Patient is here today for follow-up visit.  I last saw her a month ago.  At that time we discussed several issues.  She was having ongoing epigastric pain at that time.  We checked an ultrasound which showed cholelithiasis and she subsequently had cholecystectomy a couple of weeks ago.  We also referred her for repeat sleep study as she had been off of CPAP for the last year and had an ongoing issues with fatigue and polyarthralgia.  We started her on 25 mg amitriptyline  nightly to help with her polyarthralgia, migraines, and concern for possible fibromyalgia.  Discussed the use of AI scribe software for clinical note transcription with the patient, who gave verbal consent to proceed.  History of Present Illness Debra Miller is a 43 year old female who presents for a follow-up visit after recent cholecystectomy.  She underwent a cholecystectomy two weeks ago due to cholelithiasis identified on an ultrasound. Recovery has been challenging, described as 'the worst recovery I've had.' She continues to experience upper abdominal pain, bloating, and cramping, which she describes as 'episodes' that feel like contractions, occurring three times since the surgery and lasting about five minutes each. Persistent bloating and cramping, particularly on the left side, were present even before the gallbladder removal.  She has a history of hidradenitis suppurativa and reports a painful, flat boil under her arm that has persisted  for two to three months without drainage. In the past, these boils would pop and resolve. She has previously been on antibiotics for hidradenitis suppurativa, which improved when she lost weight, but the condition has slowly returned. She is concerned about potential side effects of antibiotics, such as yeast infections or UTIs, which she has experienced in the past.  She was  started on 25 mg of amitriptyline  nightly for polyarthralgia and possible fibromyalgia but stopped taking it due to concerns about interactions with pain medication post-surgery. The medication helped with sleep, but she is unsure if she took it long enough to assess its full effect, having taken it for about a week before stopping.  She reports ongoing issues with her ears, describing them as feeling full and 'like somebody's just poured water  into my ears,' although they are not as sore as before. She was previously given Ciprodex  ear drops, which have helped with irritation and itchiness.  She mentions that her legs are not swelling as much, likely due to reduced activity post-surgery. She also notes that she has not been on her feet for long periods, which may have contributed to the improvement.         Objective:  Physical Exam: BP 127/80   Pulse 74   Temp 98.2 F (36.8 C) (Temporal)   Ht 5' 3 (1.6 m)   Wt 231 lb 6.4 oz (105 kg)   LMP 07/12/2023 (Exact Date)   SpO2 99%   BMI 40.99 kg/m   Gen: No acute distress, resting comfortably HEENT: Bilateral TMs clear.  Otosclerosis on right TM.  EAC erythematous bilaterally with clear discharge CV: Regular rate and rhythm with no murmurs appreciated Pulm: Normal work of breathing, clear to auscultation bilaterally with no crackles, wheezes, or rhonchi Neuro: Grossly normal, moves all extremities Psych: Normal affect and thought content  Time Spent: 42 minutes of total time was spent on the date of the encounter performing the following actions: chart review prior to seeing the patient including her our recent visit as well as recent surgery, obtaining history, performing a medically necessary exam, counseling on the treatment plan, placing orders, and documenting in our EHR.        Worth HERO. Kennyth, MD 02/19/2024 9:12 AM

## 2024-02-19 NOTE — Assessment & Plan Note (Signed)
 We are reinitiating amitriptyline  25 mg nightly.  She will follow-up with us  in a month.  Depending on response to this may consider trial of Emgality  or Aimovig.

## 2024-02-19 NOTE — Assessment & Plan Note (Signed)
 Multifactorial.  We are addressing her OSA as above.

## 2024-02-19 NOTE — Patient Instructions (Signed)
 It was very nice to see you today!  VISIT SUMMARY: You had a follow-up visit after your recent gallbladder removal surgery. We discussed your ongoing abdominal pain, hidradenitis suppurativa, ear issues, joint pain, and sleep apnea.   YOUR PLAN: POST-CHOLECYSTECTOMY ABDOMINAL PAIN: You are experiencing persistent upper abdominal pain with cramping and bloating after your gallbladder removal surgery. -Recovery is expected to take several weeks. Please follow up with your surgeon next week.  HIDRADENITIS SUPPURATIVA: You have a chronic skin condition with a painful lesion under your arm. -We discussed antibiotic treatment and a referral to a dermatologist. You will start taking doxycycline . Please report any side effects like yeast infections or UTIs.  EUSTACHIAN TUBE DYSFUNCTION WITH EAR FULLNESS AND IRRITATION: You have ongoing ear fullness and irritation, which has improved with Ciprodex  drops. -Continue using Ciprodex  drops. We will refer you to an ENT specialist for further evaluation.  POLYARTHRALGIA  -We discussed resuming amitriptyline  for sleep and pain management. Start taking 25 mg of amitriptyline  nightly, and we can adjust the dose if needed.  OBSTRUCTIVE SLEEP APNEA, NOT ON CPAP: You have obstructive sleep apnea and have not been using your CPAP machine. -We will refer you to a new sleep medicine provider for a repeat sleep study.  Return in about 1 month (around 03/21/2024).   Take care, Dr Kennyth  PLEASE NOTE:  If you had any lab tests, please let us  know if you have not heard back within a few days. You may see your results on mychart before we have a chance to review them but we will give you a call once they are reviewed by us .   If we ordered any referrals today, please let us  know if you have not heard from their office within the next week.   If you had any urgent prescriptions sent in today, please check with the pharmacy within an hour of our visit to make sure  the prescription was transmitted appropriately.   Please try these tips to maintain a healthy lifestyle:  Eat at least 3 REAL meals and 1-2 snacks per day.  Aim for no more than 5 hours between eating.  If you eat breakfast, please do so within one hour of getting up.   Each meal should contain half fruits/vegetables, one quarter protein, and one quarter carbs (no bigger than a computer mouse)  Cut down on sweet beverages. This includes juice, soda, and sweet tea.   Drink at least 1 glass of water  with each meal and aim for at least 8 glasses per day  Exercise at least 150 minutes every week.

## 2024-02-19 NOTE — Assessment & Plan Note (Signed)
 At goal today on spironolactone  25 mg daily.

## 2024-02-19 NOTE — Assessment & Plan Note (Signed)
 We referred her to sleep clinic at her last visit however she was not able to schedule a visit.  Will place another referral today.

## 2024-02-19 NOTE — Assessment & Plan Note (Signed)
 This has been a chronic ongoing issue for many years.  Currently has a flareup in the right axilla.  Will start doxycycline  and refer her to dermatology.

## 2024-02-19 NOTE — Assessment & Plan Note (Addendum)
 No significant change since her last visit.  This is likely multifactorial in nature with known degenerative changes.  May have some component of fibromyalgia as well.    She did take about a week of amitriptyline  though did not note much of a benefit.  She is agreeable to restart.  She discontinued due to her recent cholecystectomy.  Did discuss with patient restarting a low dose and may not notice much of a benefit at such a low dose.  She is aware of potential side effects.  She will follow-up with us  in a month and we can adjust the dose as needed.

## 2024-03-06 ENCOUNTER — Other Ambulatory Visit: Payer: Self-pay | Admitting: Surgery

## 2024-03-06 DIAGNOSIS — Z9889 Other specified postprocedural states: Secondary | ICD-10-CM

## 2024-03-14 ENCOUNTER — Encounter: Payer: Self-pay | Admitting: Radiology

## 2024-03-14 ENCOUNTER — Inpatient Hospital Stay: Admission: RE | Admit: 2024-03-14 | Source: Ambulatory Visit

## 2024-03-14 ENCOUNTER — Inpatient Hospital Stay
Admission: RE | Admit: 2024-03-14 | Discharge: 2024-03-14 | Discharge status: Home / Self Care | Source: Ambulatory Visit | Attending: Surgery

## 2024-03-14 DIAGNOSIS — Z9889 Other specified postprocedural states: Secondary | ICD-10-CM

## 2024-03-14 MED ORDER — IOPAMIDOL (ISOVUE-300) INJECTION 61%
100.0000 mL | Freq: Once | INTRAVENOUS | Status: AC | PRN
  Administered 2024-03-14: 100 mL via INTRAVENOUS

## 2024-03-18 ENCOUNTER — Telehealth (INDEPENDENT_AMBULATORY_CARE_PROVIDER_SITE_OTHER): Payer: Self-pay

## 2024-03-18 NOTE — Telephone Encounter (Signed)
 Pt husband called Pt is sick and he would like to cancel her Apt that she has tomorrow please.

## 2024-03-19 ENCOUNTER — Institutional Professional Consult (permissible substitution) (INDEPENDENT_AMBULATORY_CARE_PROVIDER_SITE_OTHER): Admitting: Physician Assistant

## 2024-03-21 ENCOUNTER — Ambulatory Visit: Admitting: Family Medicine

## 2024-03-21 ENCOUNTER — Encounter: Payer: Self-pay | Admitting: Family Medicine

## 2024-03-21 VITALS — BP 138/82 | HR 76 | Temp 98.2°F | Ht 63.0 in | Wt 216.4 lb

## 2024-03-21 DIAGNOSIS — M2550 Pain in unspecified joint: Secondary | ICD-10-CM

## 2024-03-21 DIAGNOSIS — E282 Polycystic ovarian syndrome: Secondary | ICD-10-CM

## 2024-03-21 DIAGNOSIS — G43809 Other migraine, not intractable, without status migrainosus: Secondary | ICD-10-CM

## 2024-03-21 DIAGNOSIS — K5900 Constipation, unspecified: Secondary | ICD-10-CM | POA: Diagnosis not present

## 2024-03-21 MED ORDER — AMITRIPTYLINE HCL 50 MG PO TABS: 50.0000 mg | ORAL_TABLET | Freq: Every day | ORAL | 3 refills | Status: AC

## 2024-03-21 MED ORDER — SPIRONOLACTONE 50 MG PO TABS: 50.0000 mg | ORAL_TABLET | Freq: Every day | ORAL | 3 refills | Status: DC

## 2024-03-21 NOTE — Progress Notes (Signed)
   Debra Miller is a 43 y.o. female who presents today for an office visit.  Assessment/Plan:  Chronic Problems Addressed Today: Polyarthralgia She had much benefit with amitriptyline  in terms of managing her pain however does feel like it has helped her with sleeping better at night.  We will increase dose to 50 mg nightly and she will follow-up with us  in a few weeks via MyChart.  Migraines She has not had much of a difference since starting amitriptyline  as above though will be increasing to 50 mg nightly and she will follow up with us  in a few weeks.  Constipation Recent CT scan shows constipation.  May have IBS based on her symptoms of alternating diarrhea and constipation.  She has been referred to GI and will follow-up with them soon.  PCOS (polycystic ovarian syndrome) She has noticed acne has not been as well-controlled on low-dose of spironolactone .  Will increase to 50 mg daily.  She is aware potential side effects.  She will monitor her blood pressure at home and let us  know if she has any symptomatic lows.  She will follow-up with us  in a few weeks via MyChart.     Subjective:  HPI:  See assessment / plan for status of chronic conditions.  Patient is here today for follow-up.  I saw her a month ago.  At that time we started her back on amitriptyline  for sleep and polyarthralgia.  Discussed the use of AI scribe software for clinical note transcription with the patient, who gave verbal consent to proceed.  History of Present Illness Debra Miller is a 44 year old female with a history of bariatric surgery who presents with severe constipation and abdominal pain.  She has experienced severe constipation since her bariatric surgery, with alternating episodes of constipation and diarrhea. She can go four to five days without a bowel movement, which is often painful, and her condition has worsened over time.  She has tried various treatments including Miralax and stool  softeners, which initially provided relief but are no longer effective. Despite dietary changes, including increased intake of greens, her symptoms persist.  She experiences significant abdominal pain, described as feeling like 'a snake in my stomach.' The pain does not improve after bowel movements and can occur regardless of whether she is able to have a bowel movement.  A CT scan was performed a week ago, and the patient was told by her surgeon that she was constipated. She is awaiting a GI referral.  She is currently taking amitriptyline , which helps with sleep but not with pain.   No improvement in abdominal pain after bowel movements. Alternating constipation and diarrhea. No improvement in migraines with current medication.         Objective:  Physical Exam: BP 138/82   Pulse 76   Temp 98.2 F (36.8 C) (Temporal)   Ht 5' 3 (1.6 m)   Wt 216 lb 6.4 oz (98.2 kg)   LMP 07/12/2023 (Exact Date)   BMI 38.33 kg/m   Gen: No acute distress, resting comfortably CV: Regular rate and rhythm with no murmurs appreciated Pulm: Normal work of breathing, clear to auscultation bilaterally with no crackles, wheezes, or rhonchi Neuro: Grossly normal, moves all extremities Psych: Normal affect and thought content      Dianna Deshler M. Kennyth, MD 03/21/2024 9:57 AM

## 2024-03-21 NOTE — Assessment & Plan Note (Signed)
 Recent CT scan shows constipation.  May have IBS based on her symptoms of alternating diarrhea and constipation.  She has been referred to GI and will follow-up with them soon.

## 2024-03-21 NOTE — Patient Instructions (Addendum)
 It was very nice to see you today!  VISIT SUMMARY: You visited us  today due to severe constipation and abdominal pain, which have worsened since your bariatric surgery. We discussed your ongoing issues with migraines, polyarthralgia, hypertension, and acne. Adjustments were made to your medications, and further evaluations were recommended.  YOUR PLAN: CONSTIPATION AND ABDOMINAL PAIN: You have been experiencing severe constipation and abdominal pain, possibly related to irritable bowel syndrome (IBS), especially after your bariatric surgery. -Follow up with gastroenterology for further evaluation, including a potential colonoscopy.  MIGRAINE: Your current low dose of amitriptyline  is not effective for your migraines. -Increase amitriptyline  dosage to 50 mg nightly. -Monitor for side effects or changes in migraine frequency and intensity. -Send a MyChart message in a few weeks to report on effectiveness.  POLYARTHRALGIA: You continue to experience joint pain, and the current low dose of amitriptyline  is not providing sufficient relief. -Increase amitriptyline  dosage to 50 mg nightly. -Send a MyChart message in a few weeks to report on effectiveness.  ESSENTIAL HYPERTENSION: Your blood pressure is being managed with spironolactone , and we are increasing the dose to help with better control. -Increase spironolactone  dosage to 50 mg daily. -Monitor your blood pressure regularly to prevent hypotension. -Report any symptoms of low blood pressure or significant changes in your blood pressure.  PCOS: Your acne is not well-controlled on the current dose of spironolactone . -Increase spironolactone  dosage to 50 mg daily. -Monitor your blood pressure due to spironolactone 's effects. -Report any changes in acne severity or blood pressure.  Return in about 3 months (around 06/20/2024).   Take care, Dr Kennyth  PLEASE NOTE:  If you had any lab tests, please let us  know if you have not heard back  within a few days. You may see your results on mychart before we have a chance to review them but we will give you a call once they are reviewed by us .   If we ordered any referrals today, please let us  know if you have not heard from their office within the next week.   If you had any urgent prescriptions sent in today, please check with the pharmacy within an hour of our visit to make sure the prescription was transmitted appropriately.   Please try these tips to maintain a healthy lifestyle:  Eat at least 3 REAL meals and 1-2 snacks per day.  Aim for no more than 5 hours between eating.  If you eat breakfast, please do so within one hour of getting up.   Each meal should contain half fruits/vegetables, one quarter protein, and one quarter carbs (no bigger than a computer mouse)  Cut down on sweet beverages. This includes juice, soda, and sweet tea.   Drink at least 1 glass of water  with each meal and aim for at least 8 glasses per day  Exercise at least 150 minutes every week.

## 2024-03-21 NOTE — Assessment & Plan Note (Signed)
 She had much benefit with amitriptyline  in terms of managing her pain however does feel like it has helped her with sleeping better at night.  We will increase dose to 50 mg nightly and she will follow-up with us  in a few weeks via MyChart.

## 2024-03-21 NOTE — Assessment & Plan Note (Addendum)
 She has not had much of a difference since starting amitriptyline  as above though will be increasing to 50 mg nightly and she will follow up with us  in a few weeks.

## 2024-03-21 NOTE — Assessment & Plan Note (Signed)
 She has noticed acne has not been as well-controlled on low-dose of spironolactone .  Will increase to 50 mg daily.  She is aware potential side effects.  She will monitor her blood pressure at home and let us  know if she has any symptomatic lows.  She will follow-up with us  in a few weeks via MyChart.

## 2024-04-04 ENCOUNTER — Other Ambulatory Visit: Payer: Self-pay | Admitting: Family Medicine

## 2024-04-04 DIAGNOSIS — M2550 Pain in unspecified joint: Secondary | ICD-10-CM

## 2024-04-17 ENCOUNTER — Encounter: Payer: Self-pay | Admitting: Family Medicine

## 2024-04-18 ENCOUNTER — Encounter: Payer: Self-pay | Admitting: Physician Assistant

## 2024-04-18 NOTE — Telephone Encounter (Signed)
 Spoke with patient, informed Pt unfortunately a typical wait time to get in to dermatology.  Verbalized understanding

## 2024-04-18 NOTE — Telephone Encounter (Signed)
 See note

## 2024-04-18 NOTE — Telephone Encounter (Signed)
 We can check but that is unfortunately a typical wait time to get in to dermatology.  Worth HERO. Kennyth, MD 04/18/2024 9:36 AM

## 2024-04-19 ENCOUNTER — Other Ambulatory Visit (HOSPITAL_COMMUNITY)
Admission: RE | Admit: 2024-04-19 | Discharge: 2024-04-19 | Disposition: A | Discharge status: Home / Self Care | Source: Ambulatory Visit | Attending: Obstetrics & Gynecology | Admitting: Obstetrics & Gynecology

## 2024-04-19 ENCOUNTER — Encounter (HOSPITAL_BASED_OUTPATIENT_CLINIC_OR_DEPARTMENT_OTHER): Payer: Self-pay | Admitting: Obstetrics & Gynecology

## 2024-04-19 ENCOUNTER — Ambulatory Visit (HOSPITAL_BASED_OUTPATIENT_CLINIC_OR_DEPARTMENT_OTHER): Admitting: Obstetrics & Gynecology

## 2024-04-19 VITALS — BP 133/88 | HR 82 | Wt 217.0 lb

## 2024-04-19 DIAGNOSIS — N951 Menopausal and female climacteric states: Secondary | ICD-10-CM

## 2024-04-19 DIAGNOSIS — R7989 Other specified abnormal findings of blood chemistry: Secondary | ICD-10-CM

## 2024-04-19 DIAGNOSIS — N898 Other specified noninflammatory disorders of vagina: Secondary | ICD-10-CM | POA: Diagnosis not present

## 2024-04-19 DIAGNOSIS — L689 Hypertrichosis, unspecified: Secondary | ICD-10-CM

## 2024-04-19 DIAGNOSIS — E611 Iron deficiency: Secondary | ICD-10-CM

## 2024-04-19 MED ORDER — VITAMIN D (ERGOCALCIFEROL) 1.25 MG (50000 UNIT) PO CAPS: 50000.0000 [IU] | ORAL_CAPSULE | ORAL | 0 refills | Status: DC

## 2024-04-19 NOTE — Progress Notes (Unsigned)
 GYNECOLOGY  VISIT  CC:   Pelvic pain, fatigue, hair changes, juts doesn't feel good  HPI: 43 y.o. [id number] Married White or Caucasian female here for multiple concerns that she thinks could be related to hysterectomy that was done 07/25/2023 due to menorrhagia, continued iron  deficiency anemia and pelvic pain.  She has not had any bleeding since her surgery.  She has a full page of written notes with concerns about her symptoms which include fatigue, pelvic pain and with intercourse, hair growth on chin and in unusual pattern on legs, body aches.  She just doesn't feel well.  Having some heat and cold intolerance.  Doesn't describe classic hot flashes.  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   She is taking a bariatric PNV.  Lab work from this summer reviewed.  Reviewed normal B12, B1, HbA1C, TSH, Folate, CRP.  She did end up having cholecystectomy this July.  Most recent hb 7/28 was normal at 14.5.  Iron  level 7/15 was 83 but ferritin was still low at 8.8.  Vit D level around 20 as well.  Given significant blood work she's already had and current symptoms, feel checking for earlier menopausal symptoms is reasonable as well as repeating iron  panel.  Discussed she may end up needing some addition iron  infusions given significant anemia hx.    She is also having vaginal discharge and enough that she is needing to wear a pad.  No STI concerns.  Accompanied by her daughter today.   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   History of type 2 diabetes mellitus    since gastric bypass 05-24-2021 ,  A1c normalized,  stopped meds 09/ 2023   Hyperlipidemia,  mixed    Hypertension    IDA (iron  deficiency anemia)    MDD (major depressive disorder)    Menorrhagia with irregular cycle    Migraines    with aura   Mild intermittent asthma    Mild obstructive sleep apnea    (07-17-2023  pt stated was using cpap before and after gastric bypass surgery, but stopped after wt loss,  stated  is rescheduled for retesting ) sleep study w/ dr dohmeier--- 12-27-2019 in epic mild osa, AHI 10/hr , recommmended cpap   PCOS (polycystic ovarian syndrome)    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 stechschulte;    roux-en-y   Seasonal allergies    Urine incontinence    Uterine leiomyoma    Wears glasses     MEDS:   Current Outpatient Medications on File Prior to Visit  Medication Sig Dispense Refill   albuterol  (VENTOLIN  HFA) 108 (90 Base) MCG/ACT inhaler Inhale 2 puffs into the lungs every 6 (six) hours as needed for wheezing or shortness of breath. 54 g 3   amitriptyline  (ELAVIL ) 50 MG tablet Take 1 tablet (50 mg total) by mouth at bedtime. 90 tablet 3   Calcium Carb-Cholecalciferol  (CALCIUM 500/D) 500-10 MG-MCG CHEW Chew by mouth 3 (three) times daily. Pt stated one chew every 2 hours (Patient taking differently:  Chew 1,500 mg by mouth daily.)     cyclobenzaprine  (FLEXERIL ) 10 MG tablet Take 1 tablet (10 mg total) by mouth 2 (two) times daily. (Patient taking differently: Take 10 mg by mouth 2 (two) times daily as needed for muscle spasms.) 180 tablet 3   ketoconazole  (NIZORAL ) 2 % cream APPLY TO AFFECTED AREA TWICE A DAY 60 g 0   levocetirizine (XYZAL  ALLERGY 24HR) 5 MG tablet Take 1 tablet (5 mg total) by mouth every evening. 90 tablet 3   Multiple Vitamins-Minerals (BARIATRIC MULTIVITAMINS) CAPS Take 1 capsule by mouth in the morning, at noon, in the evening, and at bedtime. Pt stated takes on capsule 2 hours in between each capsule (Patient taking differently: Take 1 capsule by mouth daily.)     spironolactone  (ALDACTONE ) 50 MG tablet  Take 1 tablet (50 mg total) by mouth daily. 90 tablet 3   valACYclovir  (VALTREX ) 1000 MG tablet Take 1 tablet (1,000 mg total) by mouth daily. 90 tablet 3   Vitamin D , Ergocalciferol , (DRISDOL ) 1.25 MG (50000 UNIT) CAPS capsule Take 1 capsule (50,000 Units total) by mouth every 7 (seven) days. 12 capsule 4   No current facility-administered medications on file prior to visit.    ALLERGIES: Ceclor [cefaclor], Nsaids, and Pediapred [prednisolone sodium phosphate ]  SH:  married, non smoker  Review of Systems  Constitutional:  Positive for malaise/fatigue.  Respiratory: Negative.    Cardiovascular: Negative.     PHYSICAL EXAMINATION:    BP 133/88 (BP Location: Right Arm, Patient Position: Sitting, Cuff Size: Large)   Pulse 82   Wt 217 lb (98.4 kg)   LMP 07/12/2023 (Exact Date)   SpO2 100%   BMI 38.44 kg/m     General appearance: alert, cooperative and appears stated age Lymph:  no inguinal LAD noted  Pelvic: External genitalia:  no lesions              Urethra:  normal appearing urethra with no masses, tenderness or lesions              Bartholins and Skenes: normal                 Vagina: cuff well healed, normal appearing mucosa              Cervix: absent              Bimanual Exam:  Uterus:  uterus absent              Adnexa: no mass, fullness, tenderness  Chaperone was present for exam.  Assessment/Plan: 1. Vaginal discharge (Primary) - Cervicovaginal ancillary only( [place])  2. Low vitamin D  level - Vitamin D , Ergocalciferol , (DRISDOL ) 1.25 MG (50000 UNIT) CAPS capsule; Take 1 capsule (50,000 Units total) by mouth 2 (two) times a week.  Dispense: 24 capsule; Refill: 0  3. Perimenopausal symptoms - Follicle stimulating hormone - Estradiol  - Progesterone  4. Excessive growth of body hair - Testosterone , Total, LC/MS/MS  5. Iron  deficiency - Iron , TIBC and Ferritin Panel

## 2024-04-19 NOTE — Progress Notes (Unsigned)
 Debra Ileana Collet, PhD, LAT, ATC acting as a scribe for Artist Lloyd, MD.  Debra Miller is a 43 y.o. female who presents to [facility] at [facility] today for exacerbation of her lumbar radiculopathy. Pt was last seen by Dr. Lloyd in 2021-22 and was referred to neurology.  Today, pt reports lumbar radiculopathy progressively worsened since June 2023. Since we have seen her last, she's had bariatric surgery, gallbladder removal, and hysterectomy. Pt locates pain to mid to low back, midline. Radicular pain is throughout bilat hips and entire bil legs. Pain is disturbing her sleep at night.  Radiating pain: yes LE numbness/tingling: yes LE weakness: yes Aggravates: increased activity, prolonged standing Treatments tried: hot shower, Tylenol   Dx testing: 05/18/21 L-spine & t-spine MRI  Pertinent review of systems: No fevers or chills  Relevant historical information: History of bariatric surgery.   Exam:  BP 132/86   Pulse 75   Ht 5' 3 (1.6 m)   Wt 221 lb (100.2 kg)   LMP 07/12/2023 (Exact Date)   SpO2 99%   BMI 39.15 kg/m  General: Well Developed, well nourished, and in no acute distress.   MSK: L-spine nontender to palpation decreased lumbar motion. Lower extremity strength is reduced right hip flexion 4/5 and mildly reduced right foot dorsiflexion 4+/5. Strength is otherwise intact and equal bilateral lower extremities. Reflexes are intact.  C-spine: Normal-appearing decreased cervical motion upper extremity strength is intact.    Lab and Radiology Results  X-ray images cervical spine obtained today personally and independently interpreted. DDD C67.  No acute fractures are visible. Await formal radiology review  Lumbar spine and hip imaging from CT scan abdomen and pelvis from September 2025 personally and independently interpreted.  Mild degenerative changes L5-S1.     Assessment and Plan: 42 y.o. female with persistent worsening chronic low  back pain with bilateral radicular pain and paresthesia.  Plan for MRI lumbar spine.  This is especially needed as she has a bit of weakness especially involving her right leg.  Additionally will obtain x-ray cervical spine.  She does have neck pain as well.  Additionally will proceed with rheumatologic workup.  Lorazepam  for MRI claustrophobia.   PDMP reviewed during this encounter. Orders Placed This Encounter  Procedures   DG Cervical Spine 2 or 3 views    Standing Status:   Future    Number of Occurrences:   1    Expiration Date:   04/22/2025    Reason for Exam (SYMPTOM  OR DIAGNOSIS REQUIRED):   neck pain    Is patient pregnant?:   No    Preferred imaging location?:   [facility]   MR Lumbar Spine Wo Contrast    Standing Status:   Future    Expiration Date:   04/22/2025    What is the patient's sedation requirement?:   Anti-anxiety    Does the patient have a pacemaker or implanted devices?:   No    Preferred imaging location?:   [facility] (table limit-550lbs)   ANA    Standing Status:   Future    Number of Occurrences:   1    Expiration Date:   04/22/2025   Cyclic citrul peptide antibody, IgG    Standing Status:   Future    Number of Occurrences:   1    Expiration Date:   04/22/2025   HLA-B27 antigen    Polyarthalgia    Standing Status:   Future  Number of Occurrences:   1    Expiration Date:   04/22/2025   Rheumatoid factor    Standing Status:   Future    Number of Occurrences:   1    Expiration Date:   04/22/2025   Sedimentation rate    Standing Status:   Future    Number of Occurrences:   1    Expiration Date:   04/22/2025   Meds ordered this encounter  Medications   LORazepam  (ATIVAN ) 0.5 MG tablet    Sig: 1-2 tabs 30 - 60 min prior to MRI. Do not drive with this medicine.    Dispense:  4 tablet    Refill:  0     Discussed warning signs or symptoms. Please see discharge instructions. Patient expresses understanding.   The above  documentation has been reviewed and is accurate and complete Artist Lloyd, M.D.

## 2024-04-22 ENCOUNTER — Ambulatory Visit

## 2024-04-22 ENCOUNTER — Ambulatory Visit (INDEPENDENT_AMBULATORY_CARE_PROVIDER_SITE_OTHER): Admitting: Family Medicine

## 2024-04-22 VITALS — BP 132/86 | HR 75 | Ht 63.0 in | Wt 221.0 lb

## 2024-04-22 DIAGNOSIS — R29898 Other symptoms and signs involving the musculoskeletal system: Secondary | ICD-10-CM

## 2024-04-22 DIAGNOSIS — M542 Cervicalgia: Secondary | ICD-10-CM

## 2024-04-22 DIAGNOSIS — M2550 Pain in unspecified joint: Secondary | ICD-10-CM | POA: Diagnosis not present

## 2024-04-22 DIAGNOSIS — M5416 Radiculopathy, lumbar region: Secondary | ICD-10-CM

## 2024-04-22 LAB — ESTRADIOL: Estradiol: 52.6 pg/mL

## 2024-04-22 LAB — IRON,TIBC AND FERRITIN PANEL
Ferritin: 23 ng/mL (ref 15–150)
Iron Saturation: 20 % (ref 15–55)
Iron: 89 ug/dL (ref 27–159)
Total Iron Binding Capacity: 455 ug/dL — ABNORMAL HIGH (ref 250–450)
UIBC: 366 ug/dL (ref 131–425)

## 2024-04-22 LAB — CERVICOVAGINAL ANCILLARY ONLY
Bacterial Vaginitis (gardnerella): POSITIVE — AB
Candida Glabrata: NEGATIVE
Candida Vaginitis: NEGATIVE
Comment: NEGATIVE
Comment: NEGATIVE
Comment: NEGATIVE

## 2024-04-22 LAB — PROGESTERONE: Progesterone: 0.6 ng/mL

## 2024-04-22 LAB — FOLLICLE STIMULATING HORMONE: FSH: 9.4 m[IU]/mL

## 2024-04-22 LAB — TESTOSTERONE, TOTAL, LC/MS/MS: Testosterone, total: 22.7 ng/dL

## 2024-04-22 LAB — SEDIMENTATION RATE: Sed Rate: 9 mm/h (ref 0–20)

## 2024-04-22 MED ORDER — LORAZEPAM 0.5 MG PO TABS: ORAL_TABLET | ORAL | 0 refills | Status: AC

## 2024-04-22 NOTE — Patient Instructions (Signed)
 Thank you for coming in today.   Please get an Xray today before you leave   Please get labs today before you leave   I've sent a prescription for Lorazepam  to your pharmacy to take before the MRI  You should hear from MRI scheduling within 1 week. If you do not hear please let me know.

## 2024-04-23 ENCOUNTER — Ambulatory Visit (INDEPENDENT_AMBULATORY_CARE_PROVIDER_SITE_OTHER): Admitting: Physician Assistant

## 2024-04-23 ENCOUNTER — Encounter (INDEPENDENT_AMBULATORY_CARE_PROVIDER_SITE_OTHER): Payer: Self-pay | Admitting: Physician Assistant

## 2024-04-23 ENCOUNTER — Other Ambulatory Visit (HOSPITAL_BASED_OUTPATIENT_CLINIC_OR_DEPARTMENT_OTHER): Payer: Self-pay | Admitting: Obstetrics & Gynecology

## 2024-04-23 VITALS — BP 124/76 | HR 73 | Ht 63.0 in | Wt 221.0 lb

## 2024-04-23 DIAGNOSIS — H6993 Unspecified Eustachian tube disorder, bilateral: Secondary | ICD-10-CM

## 2024-04-23 NOTE — Progress Notes (Signed)
 Dear Dr. Kennyth, Here is my assessment for our mutual patient, Debra Miller. Thank you for allowing me the opportunity to care for your patient. Please do not hesitate to contact me should you have any other questions. Sincerely, Chyrl Cohen PA-C  Otolaryngology Clinic Note Referring provider: Dr. Kennyth HPI:  Debra Miller is a 43 y.o. female kindly referred by Dr. Kennyth   The patient is a 43 year old female seen in our office for evaluation of ear pressure.  She notes several years ago she started to have pressure in her ears, left worse than right.  She notes presently they feel like they are full of water , she notes when she wakes up in the morning she feels like she has liquid in her left ear.  She notes this improves as the day goes on.  She does have clicking and popping in both ears.  She notes when she was a child she had tympanostomy tubes.  She has had a couple of acute otitis media and otitis externa infections as an adult but no reoccurrence.  She notes she is a diabetic that is well-controlled.  No history of trauma to the ears other than tympanostomy tubes.  She has a history of seasonal allergies and takes Xyzal  for this.   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   History of type 2 diabetes mellitus    since gastric bypass 05-24-2021 ,  A1c normalized,  stopped meds 09/ 2023   Hyperlipidemia, mixed    Hypertension    IDA (iron  deficiency anemia)    MDD (major depressive disorder)    Menorrhagia with irregular cycle    Migraines    with aura   Mild intermittent asthma    Mild  obstructive sleep apnea    (07-17-2023  pt stated was using cpap before and after gastric bypass surgery, but stopped after wt loss,  stated  is rescheduled for retesting ) sleep study w/ dr dohmeier--- 12-27-2019 in epic mild osa, AHI 10/hr , recommmended cpap   PCOS (polycystic ovarian syndrome)    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 stechschulte;    roux-en-y   Seasonal allergies    Urine incontinence    Uterine leiomyoma    Wears glasses      Past Surgical History:  Procedure Laterality Date   CHOLECYSTECTOMY N/A 01/30/2024   Procedure: LAPAROSCOPIC CHOLECYSTECTOMY with INTRAOP CHOLANGIOGRAM;  Surgeon: Lyndel Deward PARAS, MD;  Location: [facility];  Service: General;  Laterality: N/A;   CYSTOSCOPY N/A 07/25/2023   Procedure: CYSTOSCOPY;  Surgeon: Cleotilde Ronal RAMAN, MD;  Location: [facility];  Service: Gynecology;  Laterality: N/A;   DILATION AND EVACUATION  09/15/1999   @ [facility] by dr nikki;  missed ab   KNEE ARTHROSCOPY Left 2018   LAPAROSCOPY WITH TUBAL LIGATION Bilateral 05/08/2007   @ [facility] by dr denton;   cautery used   RADIOLOGY WITH ANESTHESIA N/A 09/22/2020   Procedure: MRI WITH ANESTHESIA CERVICAL SPINE WITHOUT AND BRAIN WITHOUT;  Surgeon:  Radiologist, Medication, MD;  Location: [facility];  Service: Radiology;  Laterality: N/A;   RADIOLOGY WITH ANESTHESIA N/A 05/18/2021   Procedure: MRI WITH ANESTHESIA  THORACIC WITHOUT CONTRAST AND LUMBAR WITHOUT CONTRAST;  Surgeon: Radiologist, Medication, MD;  Location: [facility];  Service: Radiology;  Laterality: N/A;   ROUX-EN-Y GASTRIC BYPASS  05/24/2021   @ [facility] by Dr. Lyndel;   Robot Assisted Roux-En-Y Gastric Bypass and Upper Endoscopy   TONSILLECTOMY AND ADENOIDECTOMY  1990   TOTAL LAPAROSCOPIC HYSTERECTOMY WITH SALPINGECTOMY Bilateral 07/25/2023   Procedure: TOTAL LAPAROSCOPIC HYSTERECTOMY WITH SALPINGECTOMY;  Surgeon: Cleotilde Ronal RAMAN, MD;  Location: [facility];  Service: Gynecology;   Laterality: Bilateral;   TYMPANOSTOMY TUBE PLACEMENT Bilateral    child    Family History  Problem Relation Age of Onset   Arthritis Mother    High Cholesterol Mother    Hypertension Mother    Diabetes Mother    Rheum arthritis Mother    Asthma Mother    Hyperlipidemia Mother    Migraines Mother    Thyroid  disease Mother    Colon polyps Mother    Arthritis Maternal Grandmother    Breast cancer Maternal Grandmother    High Cholesterol Maternal Grandmother    Stroke Maternal Grandmother    Hypertension Maternal Grandmother    Migraines Maternal Grandmother    Colon polyps Sister    Colon polyps Maternal Aunt    Colon cancer Neg Hx    Esophageal cancer Neg Hx    Rectal cancer Neg Hx    Stomach cancer Neg Hx      Social Connections: Unknown (11/09/2021)   Received from [employer]   Social Network    Social Network: Not on file      Current Outpatient Medications:    albuterol  (VENTOLIN  HFA) 108 (90 Base) MCG/ACT inhaler, Inhale 2 puffs into the lungs every 6 (six) hours as needed for wheezing or shortness of breath., Disp: 54 g, Rfl: 3   amitriptyline  (ELAVIL ) 50 MG tablet, Take 1 tablet (50 mg total) by mouth at bedtime., Disp: 90 tablet, Rfl: 3   Calcium Carb-Cholecalciferol  (CALCIUM 500/D) 500-10 MG-MCG CHEW, Chew by mouth 3 (three) times daily. Pt stated one chew every 2 hours (Patient taking differently: Chew 1,500 mg by mouth daily.), Disp: , Rfl:    ketoconazole  (NIZORAL ) 2 % cream, APPLY TO AFFECTED AREA TWICE A DAY, Disp: 60 g, Rfl: 0   levocetirizine (XYZAL  ALLERGY 24HR) 5 MG tablet, Take 1 tablet (5 mg total) by mouth every evening., Disp: 90 tablet, Rfl: 3   LORazepam  (ATIVAN ) 0.5 MG tablet, 1-2 tabs 30 - 60 min prior to MRI. Do not drive with this medicine., Disp: 4 tablet, Rfl: 0   Multiple Vitamins-Minerals (BARIATRIC MULTIVITAMINS) CAPS, Take 1 capsule by mouth in the morning, at noon, in the evening, and at bedtime. Pt stated takes on capsule 2 hours in  between each capsule (Patient taking differently: Take 1 capsule by mouth daily.), Disp: , Rfl:    spironolactone  (ALDACTONE ) 50 MG tablet, Take 1 tablet (50 mg total) by mouth daily., Disp: 90 tablet, Rfl: 3   valACYclovir  (VALTREX ) 1000 MG tablet, Take 1 tablet (1,000 mg total) by mouth daily., Disp: 90 tablet, Rfl: 3   Vitamin D , Ergocalciferol , (DRISDOL ) 1.25 MG (50000 UNIT) CAPS capsule, Take 1 capsule (50,000 Units total) by mouth 2 (two) times a week., Disp: 24 capsule, Rfl: 0   cyclobenzaprine  (FLEXERIL ) 10 MG tablet, Take 1 tablet (10 mg total) by  mouth 2 (two) times daily. (Patient not taking: Reported on 04/23/2024), Disp: 180 tablet, Rfl: 3   Physical Exam:   BP 124/76 (BP Location: Right Arm, Patient Position: Sitting)   Pulse 73   Ht 5' 3 (1.6 m)   Wt 221 lb (100.2 kg)   LMP 07/12/2023 (Exact Date)   SpO2 97%   BMI 39.15 kg/m   Pertinent Findings  CN II-XII intact Sclerosis of the right posterior TM, it is intact with well-pneumatized middle ear space, left TM intact with well-pneumatized middle ear space Anterior rhinoscopy: Septum midline; bilateral inferior turbinates with mild hypertrophy No lesions of oral cavity/oropharynx; dentition in normal limits No obviously palpable neck masses/lymphadenopathy/thyromegaly No respiratory distress or stridor  Seprately Identifiable Procedures:  None  Impression & Plans:  Debra Miller is a 43 y.o. female with the following   Eustachian tube dysfunction-  The patient symptoms are most consistent with eustachian tube dysfunction.  No signs of infection on exam today.  I would recommend audiological evaluation and office visit follow-up.  In the meantime I would encourage daily Flonase, daily antihistamine, and nasal saline irrigation.  I am happy to see her back in the office sooner as needed.  The patient verbalized understanding and agreement to today's plan.   - f/u office visit after audiological evaluation   Thank you  for allowing me the opportunity to care for your patient. Please do not hesitate to contact me should you have any other questions.  Sincerely, Chyrl Cohen PA-C [facility] Phone: [phone] Fax: [phone]  04/23/2024, 9:27 AM

## 2024-04-24 LAB — CYCLIC CITRUL PEPTIDE ANTIBODY, IGG: Cyclic Citrullin Peptide Ab: <16 U

## 2024-04-24 LAB — RHEUMATOID FACTOR: Rheumatoid fact SerPl-aCnc: 10 [IU]/mL (ref <14)

## 2024-04-24 LAB — HLA-B27 ANTIGEN: HLA-B27 Antigen: NEGATIVE

## 2024-04-24 LAB — ANA: Anti Nuclear Antibody (ANA): NEGATIVE

## 2024-04-25 ENCOUNTER — Ambulatory Visit: Payer: Self-pay | Admitting: Family Medicine

## 2024-04-25 NOTE — Progress Notes (Signed)
 Cervical spine x-ray shows worsening arthritis at C6-C7.

## 2024-04-25 NOTE — Progress Notes (Signed)
Rheumatology labs are normal

## 2024-04-27 ENCOUNTER — Encounter (HOSPITAL_BASED_OUTPATIENT_CLINIC_OR_DEPARTMENT_OTHER): Payer: Self-pay | Admitting: Obstetrics & Gynecology

## 2024-04-29 ENCOUNTER — Other Ambulatory Visit (HOSPITAL_BASED_OUTPATIENT_CLINIC_OR_DEPARTMENT_OTHER): Payer: Self-pay

## 2024-04-29 ENCOUNTER — Ambulatory Visit
Admission: RE | Admit: 2024-04-29 | Discharge: 2024-04-29 | Disposition: A | Discharge status: Home / Self Care | Source: Ambulatory Visit | Attending: Family Medicine | Admitting: Family Medicine

## 2024-04-29 DIAGNOSIS — R29898 Other symptoms and signs involving the musculoskeletal system: Secondary | ICD-10-CM

## 2024-04-29 DIAGNOSIS — M5416 Radiculopathy, lumbar region: Secondary | ICD-10-CM

## 2024-04-29 MED ORDER — METRONIDAZOLE 500 MG PO TABS: 500.0000 mg | ORAL_TABLET | Freq: Two times a day (BID) | ORAL | 0 refills | Status: DC

## 2024-05-01 ENCOUNTER — Ambulatory Visit (HOSPITAL_BASED_OUTPATIENT_CLINIC_OR_DEPARTMENT_OTHER): Payer: Self-pay | Admitting: Obstetrics & Gynecology

## 2024-05-01 NOTE — Progress Notes (Signed)
 Lumbar spine MRI shows arthritis and potential pinched nerves.  Recommend return to clinic to go over results in full detail and discuss treatment plan options including potential injection planning.

## 2024-05-02 ENCOUNTER — Telehealth (HOSPITAL_BASED_OUTPATIENT_CLINIC_OR_DEPARTMENT_OTHER): Payer: Self-pay

## 2024-05-02 NOTE — Telephone Encounter (Signed)
 Pt was returning Dr. Dianne call. I told that she would not be in the office until later today.

## 2024-05-09 NOTE — Progress Notes (Unsigned)
 LILLETTE Ileana Collet, PhD, LAT, ATC acting as a scribe for Artist Lloyd, MD.  Debra Miller is a 43 y.o. female who presents to [facility] at [facility] today for f/u lumbar radiculopathy and neck pain w/ MRI review. Pt was last seen by Dr. Lloyd on 04/22/24 and was prescribed lorazepam  for l-spine MRI. Rheum labs were also obtained.  Today, pt reports LBP and neck pain have worsened. LE radicular pain is now bilateral. Pain in her low back is also moving into the thoracic spine. She experiences numbness in her hands w/ cervical rotation. Weakness in both UE and LE. -bladder/bowel dysfunction.  Dx testing: 04/29/24 L-spine MRI  04/22/24 C-spine XR 05/18/21 L-spine & t-spine MRI   Pertinent review of systems: No fevers or chills  Relevant historical information: History of malabsorption and iron  deficiency and B12 deficiency following gastric bypass surgery.   Exam:  BP 124/86   Pulse 78   Ht 5' 3 (1.6 m)   Wt 221 lb (100.2 kg)   LMP 07/12/2023 (Exact Date)   SpO2 100%   BMI 39.15 kg/m  General: Well Developed, well nourished, and in no acute distress.   MSK: C-spine: Normal-appearing Normal cervical motion. Upper extremity strength is reduced.  Deltoid strength bilaterally 4+/5. Right elbow flexion reduced 4/5. Otherwise upper extremity strength is intact. Reflexes are intact.  L-spine: Normal.  Nontender palpation midline. Lower extremity strength is reduced bilateral hip flexion 4/5.  Knee extension bilaterally 4/5.  Left foot dorsiflexion 3+/5 Otherwise lower extremity strength is intact. Reflexes are intact.  No clonus.   Lab and Radiology Results  MR LUMBAR SPINE WITHOUT IV CONTRAST   COMPARISON: None available   CLINICAL HISTORY: Lumbar radiculopathy.   TECHNIQUE: SAG T2, SAG T1, SAG STIR, AX T2, AX T1 without IV contrast.   FINDINGS: There is normal alignment of the lumbar spine. There is no vertebral body height loss, subluxation or  marrow replacing process. The sacrum and SI joints are unremarkable so far as visualized. Conus and cauda equina are unremarkable.   T12-L1: There is no focal disc protrusion, foraminal or spinal stenosis. Mild facet arthrosis.   L1-2: There is no focal disc protrusion, foraminal or spinal stenosis. Mild facet arthrosis.   L2-3: There is no focal disc protrusion, foraminal or spinal stenosis. Mild facet arthrosis.   L3-4: There is no focal disc protrusion, foraminal or spinal stenosis. Mild-to-moderate facet arthrosis.   L4-5: Disc desiccation with mild broad-based disc osteophyte slightly effacing the ventral thecal sac. There is moderate facet arthrosis, left greater than right. There is mild crowding of the descending nerve roots in the left lateral recess. There is mild caudal left foraminal stenosis. Correlation for left L4 radicular symptoms.   L5-S1: There is a broad-based disc osteophyte and moderate facet arthrosis. There is a small central annular tear. There is mild caudal foraminal narrowing bilaterally without impingement of the exiting nerves.   The retroperitoneal structures demonstrate no significant abnormality.   IMPRESSION: Mild degenerative disc desiccation mild/moderate facet arthrosis at L4-5 and L5-S1. Is a small annular tear. There is no high-grade foraminal or spinal stenosis. There is mild crowding of the descending nerve roots in the left lateral recess of the L4-5 level. See above for more detail at each individual level.   Electronically signed by: Norleen Satchel MD 04/30/2024 04:49 PM EDT RP Workstation: [id number]  LILLETTE Artist Lloyd, personally (independently) visualized and performed the interpretation of the images attached in this note.  Component  Latest Ref Rng 04/19/2024 04/22/2024  TIBC     250 - 450 ug/dL 544 (H)    UIBC     868 - 425 ug/dL 633    Iron      27 - 159 ug/dL 89    Iron  Saturation     15 - 55 % 20    Ferritin     15 -  150 ng/mL 23    Sed Rate     0 - 20 mm/hr  9   RA Latex Turbid.     <14 IU/mL  10   HLA-B27 Antigen     NEGATIVE   NEGATIVE   Cyclic Citrullin Peptide Ab     UNITS  <16   Anti Nuclear Antibody (ANA)     NEGATIVE   NEGATIVE        Assessment and Plan: 43 y.o. female with bilateral upper and lower extremity weakness.  Lumbar spine MRI does show the potential for left L4 nerve root impingement but does not explain the bilateral weakness nor upper extremity weakness.  Plan for MRI cervical spine to further explain upper lower extremity weakness.  She does have a relevant history of current iron  deficiency anemia due to malabsorption.  She does have a history of restless leg syndrome and could be experiencing neurologic complications because of iron  deficiency.  Will go ahead and do iron  infusion she is already been on oral iron  and cannot improve her iron  levels sufficiently.  Continue B12 supplementation consider checking B12 in the near future.  Anticipate either surgery consultation or recheck or epidural steroid injection following cervical spine MRI.   PDMP not reviewed this encounter. Orders Placed This Encounter  Procedures   MR CERVICAL SPINE WO CONTRAST    Standing Status:   Future    Expiration Date:   05/10/2025    What is the patient's sedation requirement?:   No Sedation    Does the patient have a pacemaker or implanted devices?:   No    Preferred imaging location?:   [facility]   No orders of the defined types were placed in this encounter.    Discussed warning signs or symptoms. Please see discharge instructions. Patient expresses understanding.   The above documentation has been reviewed and is accurate and complete Artist Lloyd, M.D.

## 2024-05-10 ENCOUNTER — Ambulatory Visit: Admitting: Family Medicine

## 2024-05-10 ENCOUNTER — Telehealth (HOSPITAL_COMMUNITY): Payer: Self-pay

## 2024-05-10 VITALS — BP 124/86 | HR 78 | Ht 63.0 in | Wt 221.0 lb

## 2024-05-10 DIAGNOSIS — D508 Other iron deficiency anemias: Secondary | ICD-10-CM | POA: Diagnosis not present

## 2024-05-10 DIAGNOSIS — R29898 Other symptoms and signs involving the musculoskeletal system: Secondary | ICD-10-CM | POA: Diagnosis not present

## 2024-05-10 DIAGNOSIS — M5416 Radiculopathy, lumbar region: Secondary | ICD-10-CM | POA: Diagnosis not present

## 2024-05-10 DIAGNOSIS — M542 Cervicalgia: Secondary | ICD-10-CM | POA: Diagnosis not present

## 2024-05-10 NOTE — Telephone Encounter (Addendum)
 Auth Submission: NO AUTH NEEDED Site of care: Site of care: [facility] Payer: [employer] [place]  Medication & CPT/J Code(s) submitted: Feraheme (ferumoxytol) [id number] Diagnosis Code: D50.0 Route of submission (phone, fax, portal):  Phone # Fax # Auth type: Buy/Bill PB Units/visits requested: 510mg  x 2 doses Reference number:  Approval from: 05/10/24 to 07/03/24

## 2024-05-10 NOTE — Patient Instructions (Addendum)
 Thank you for coming in today.   You should hear from the Infusion Center to schedule your iron  infusion.  Take Vitamin B12 supplement  You should hear from MRI scheduling within 1 week. If you do not hear please let me know.

## 2024-05-20 ENCOUNTER — Other Ambulatory Visit (HOSPITAL_BASED_OUTPATIENT_CLINIC_OR_DEPARTMENT_OTHER): Payer: Self-pay | Admitting: Obstetrics & Gynecology

## 2024-05-20 ENCOUNTER — Encounter: Payer: Self-pay | Admitting: Family Medicine

## 2024-05-20 ENCOUNTER — Encounter (HOSPITAL_BASED_OUTPATIENT_CLINIC_OR_DEPARTMENT_OTHER): Payer: Self-pay | Admitting: Obstetrics & Gynecology

## 2024-05-20 ENCOUNTER — Ambulatory Visit
Admission: RE | Admit: 2024-05-20 | Discharge: 2024-05-20 | Disposition: A | Source: Ambulatory Visit | Attending: Family Medicine | Admitting: Family Medicine

## 2024-05-20 DIAGNOSIS — N951 Menopausal and female climacteric states: Secondary | ICD-10-CM

## 2024-05-20 DIAGNOSIS — M542 Cervicalgia: Secondary | ICD-10-CM

## 2024-05-20 MED ORDER — ESTRADIOL 0.0375 MG/24HR TD PTTW: 1.0000 | MEDICATED_PATCH | TRANSDERMAL | 1 refills | Status: DC

## 2024-05-27 ENCOUNTER — Ambulatory Visit: Payer: Self-pay | Admitting: Family Medicine

## 2024-05-27 DIAGNOSIS — E041 Nontoxic single thyroid nodule: Secondary | ICD-10-CM

## 2024-05-27 DIAGNOSIS — M542 Cervicalgia: Secondary | ICD-10-CM

## 2024-05-27 DIAGNOSIS — R29898 Other symptoms and signs involving the musculoskeletal system: Secondary | ICD-10-CM

## 2024-05-27 NOTE — Progress Notes (Signed)
 Cervical spine MRI does not show a severely pinched nerve.  It does show some small areas of nerve being pinched.  I do think trying a epidural steroid injection of the cervical spine is a good idea.  Additionally there is a incidentally noticed left thyroid  nodule that should be followed up with an ultrasound.  I am going to order that as well.

## 2024-05-29 ENCOUNTER — Ambulatory Visit (INDEPENDENT_AMBULATORY_CARE_PROVIDER_SITE_OTHER)

## 2024-05-29 VITALS — BP 123/79 | HR 82 | Temp 98.5°F | Resp 18 | Ht 63.0 in | Wt 222.4 lb

## 2024-05-29 DIAGNOSIS — D500 Iron deficiency anemia secondary to blood loss (chronic): Secondary | ICD-10-CM | POA: Diagnosis not present

## 2024-05-29 MED ORDER — SODIUM CHLORIDE 0.9 % IV SOLN
510.0000 mg | Freq: Once | INTRAVENOUS | Status: AC
  Administered 2024-05-29: 510 mg via INTRAVENOUS
  Filled 2024-05-29: qty 17

## 2024-05-29 NOTE — Progress Notes (Signed)
 Diagnosis:  Iron  Deficiency Anemia  Provider:  Praveen Mannam MD  Procedure: IV Infusion  IV Type: Peripheral, IV Location: L Antecubital   Feraheme  (Ferumoxytol ), Dose: 510 mg  Infusion Start Time: 1444  Infusion Stop Time: 1500  Post Infusion IV Care: Observation period completed and Peripheral IV Discontinued  Discharge: Condition: Good, Destination: Home . AVS Declined  Performed by:  Ryenne Lynam G Pilkington-Burchett, RN

## 2024-06-05 ENCOUNTER — Ambulatory Visit (INDEPENDENT_AMBULATORY_CARE_PROVIDER_SITE_OTHER)

## 2024-06-05 VITALS — BP 117/79 | HR 66 | Temp 97.9°F | Resp 16 | Ht 63.0 in | Wt 221.8 lb

## 2024-06-05 DIAGNOSIS — D500 Iron deficiency anemia secondary to blood loss (chronic): Secondary | ICD-10-CM | POA: Diagnosis not present

## 2024-06-05 MED ORDER — SODIUM CHLORIDE 0.9 % IV SOLN
510.0000 mg | Freq: Once | INTRAVENOUS | Status: AC
  Administered 2024-06-05: 510 mg via INTRAVENOUS
  Filled 2024-06-05: qty 17

## 2024-06-05 NOTE — Progress Notes (Signed)
 Diagnosis: Iron  Deficiency Anemia  Provider:  Mannam, Praveen MD  Procedure: IV Infusion  IV Type: Peripheral, IV Location: L Antecubital  Feraheme  (Ferumoxytol ), Dose: 510 mg  Infusion Start Time: 1452  Infusion Stop Time: 1509  Post Infusion IV Care: Patient declined observation and Peripheral IV Discontinued  Discharge: Condition: Good, Destination: Home . AVS Declined  Performed by:  Rocky FORBES Sar, RN

## 2024-06-06 NOTE — Progress Notes (Signed)
 Chief Complaint: Epigastric pain  HPI:    Debra Miller is a 43 year old Caucasian female with a past medical history as listed below including diabetes, bipolar disorder, GAD, IDA and multiple others, previously noted Dr. Eda, who was referred to me by Debra Worth HERO, MD for a complaint of epigastric pain.      10/20/2020 patient seen in clinic by Debra Zehr, PA-C.  Patient discussed dysphagia and reflux.  She was on Omeprazole  40 mg daily and it helped her symptoms maybe about 25% of the time.  Also diarrhea.  On Ozempic .  Scheduled for an EGD.  Omeprazole  40 mg daily continued.  Celiac labs checked and recommended colonoscopy.  At that time Dr. Eda recommended trial Dicyclomine  20 mg 4 times daily.    10/30/2020 EGD with LA grade a reflux esophagitis, erosive gastropathy and otherwise normal.  At that time increase Meprazole to 40 mg twice daily.  Biopsy showed reactive gastropathy and mild reflux changes.    01/29/2024 CBC normal, BMP with a BUN of 25 and otherwise normal.    03/14/2024 CT of the abdomen pelvis with contrast with constipation and changes of prior gastric bypass.    04/19/2024 iron  studies with a TIBC elevated 455 and otherwise normal ferritin and iron  level.  At that point PCP recommended trial of an estradiol  patch to see if hormonal fluctuation was causing symptoms.    Today, patient presents to clinic with a myriad of GI problems. She explains that after she had her gallbladder out and had gastric bypass surgery back in 2022 she had a couple of good years but over the past year she has had increasing GI symptoms that seem to be worse and worse.  First notes that she continues to have some right upper quadrant discomfort that is sharp and cramping but maybe the cramp does not last as long as before my gallbladder was out, this seems to radiate through her abdomen and cause sharp pains and is daily.  Not necessarily brought on by food.    Also continues with reflux symptoms,  currently not on any medications but describes regurgitation.  Also burning in her stomach.    Also describes that she is either constipated with hard stool or has diarrhea.  Typically is 4 to 5 days without a bowel movement sometimes is just watery and other times it is constipation.  Tried MiraLAX daily which did not make a difference.    Patient is still on Amitriptyline  50 mg nightly.    Denies fever, chills, weight loss or blood in her stool.  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   History of type 2 diabetes mellitus    since gastric bypass 05-24-2021 ,  A1c normalized,  stopped meds 09/ 2023   Hyperlipidemia, mixed    Hypertension    IDA (iron  deficiency anemia)    MDD (major depressive disorder)    Menorrhagia with irregular cycle    Migraines    Mild intermittent asthma    Mild obstructive sleep apnea    (07-17-2023  pt stated was using cpap before and after gastric bypass surgery, but stopped after  wt loss,  stated  is rescheduled for retesting ) sleep study w/ dr dohmeier--- 12-27-2019 in epic mild osa, AHI 10/hr , recommmended cpap   PCOS (polycystic ovarian syndrome)    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 stechschulte;    roux-en-y   Seasonal allergies    Urine incontinence    Uterine leiomyoma    Wears glasses     Past Surgical History:  Procedure Laterality Date   CHOLECYSTECTOMY N/A 01/30/2024   Procedure: LAPAROSCOPIC CHOLECYSTECTOMY with INTRAOP CHOLANGIOGRAM;  Surgeon: Lyndel Deward PARAS, MD;  Location: [facility];  Service: General;  Laterality: N/A;   CYSTOSCOPY N/A 07/25/2023   Procedure: CYSTOSCOPY;  Surgeon: Cleotilde Ronal RAMAN, MD;  [facility];   Service: Gynecology;  Laterality: N/A;   DILATION AND EVACUATION  09/15/1999   @ [facility] by dr nikki;  missed ab   KNEE ARTHROSCOPY Left 2018   LAPAROSCOPY WITH TUBAL LIGATION Bilateral 05/08/2007   @ [facility] by dr denton;   cautery used   RADIOLOGY WITH ANESTHESIA N/A 09/22/2020   Procedure: MRI WITH ANESTHESIA CERVICAL SPINE WITHOUT AND BRAIN WITHOUT;  Surgeon: Radiologist, Medication, MD;  Location: [facility];  Service: Radiology;  Laterality: N/A;   RADIOLOGY WITH ANESTHESIA N/A 05/18/2021   Procedure: MRI WITH ANESTHESIA  THORACIC WITHOUT CONTRAST AND LUMBAR WITHOUT CONTRAST;  Surgeon: Radiologist, Medication, MD;  Location: [facility];  Service: Radiology;  Laterality: N/A;   ROUX-EN-Y GASTRIC BYPASS  05/24/2021   @ [facility] by Dr. Lyndel;   Robot Assisted Roux-En-Y Gastric Bypass and Upper Endoscopy   TONSILLECTOMY AND ADENOIDECTOMY  1990   TOTAL LAPAROSCOPIC HYSTERECTOMY WITH SALPINGECTOMY Bilateral 07/25/2023   Procedure: TOTAL LAPAROSCOPIC HYSTERECTOMY WITH SALPINGECTOMY;  Surgeon: Cleotilde Ronal RAMAN, MD;  Location: [facility];  Service: Gynecology;  Laterality: Bilateral;   TYMPANOSTOMY TUBE PLACEMENT Bilateral    child    Current Outpatient Medications  Medication Sig Dispense Refill   albuterol  (VENTOLIN  HFA) 108 (90 Base) MCG/ACT inhaler Inhale 2 puffs into the lungs every 6 (six) hours as needed for wheezing or shortness of breath. 54 g 3   amitriptyline  (ELAVIL ) 50 MG tablet Take 1 tablet (50 mg total) by mouth at bedtime. 90 tablet 3   Calcium Carb-Cholecalciferol  (CALCIUM 500/D) 500-10 MG-MCG CHEW Chew by mouth 3 (three) times daily. Pt stated one chew every 2 hours (Patient taking differently: Chew 1,500 mg by mouth daily.)     cyclobenzaprine  (FLEXERIL ) 10 MG tablet Take 1 tablet (10 mg total) by mouth 2 (two) times daily. (Patient not taking: Reported on 04/23/2024) 180 tablet 3   estradiol  (VIVELLE -DOT) 0.0375 MG/24HR Place 1 patch onto the skin 2 (two) times a week. 24 patch  1   ketoconazole  (NIZORAL ) 2 % cream APPLY TO AFFECTED AREA TWICE A DAY 60 g 0   levocetirizine (XYZAL  ALLERGY 24HR) 5 MG tablet Take 1 tablet (5 mg total) by mouth every evening. 90 tablet 3   LORazepam  (ATIVAN ) 0.5 MG tablet 1-2 tabs 30 - 60 min prior to MRI. Do not drive with this medicine. 4 tablet 0   Multiple Vitamins-Minerals (BARIATRIC MULTIVITAMINS) CAPS Take 1 capsule by mouth in the morning, at noon, in the evening, and at bedtime. Pt stated takes on capsule 2 hours in between each capsule (Patient taking differently: Take 1 capsule by mouth daily.)     spironolactone  (ALDACTONE ) 50 MG tablet Take 1 tablet (50 mg total) by mouth daily. 90 tablet 3  valACYclovir  (VALTREX ) 1000 MG tablet Take 1 tablet (1,000 mg total) by mouth daily. 90 tablet 3   Vitamin D , Ergocalciferol , (DRISDOL ) 1.25 MG (50000 UNIT) CAPS capsule Take 1 capsule (50,000 Units total) by mouth 2 (two) times a week. 24 capsule 0   No current facility-administered medications for this visit.    Allergies as of 06/07/2024 - Review Complete 05/10/2024  Allergen Reaction Noted   Ceclor [cefaclor] Hives 01/28/2013   Nsaids Other (See Comments) 01/09/2023   Pediapred [prednisolone sodium phosphate ] Hives 01/28/2013    Family History  Problem Relation Age of Onset   Arthritis Mother    High Cholesterol Mother    Hypertension Mother    Diabetes Mother    Rheum arthritis Mother    Asthma Mother    Hyperlipidemia Mother    Migraines Mother    Thyroid  disease Mother    Colon polyps Mother    Arthritis Maternal Grandmother    Breast cancer Maternal Grandmother    High Cholesterol Maternal Grandmother    Stroke Maternal Grandmother    Hypertension Maternal Grandmother    Migraines Maternal Grandmother    Colon polyps Sister    Colon polyps Maternal Aunt    Colon cancer Neg Hx    Esophageal cancer Neg Hx    Rectal cancer Neg Hx    Stomach cancer Neg Hx     Social History   Socioeconomic History   Marital  status: Married    Spouse name: Not on file   Number of children: 5   Years of education: Not on file   Highest education level: Not on file  Occupational History   Not on file  Tobacco Use   Smoking status: Never   Smokeless tobacco: Never  Vaping Use   Vaping status: Never Used  Substance and Sexual Activity   Alcohol use: No    Alcohol/week: 0.0 standard drinks of alcohol   Drug use: Never   Sexual activity: Yes    Partners: Male    Birth control/protection: Surgical    Comment: Tubal  Other Topics Concern   Not on file  Social History Narrative   Work or School: homemakerHome Situation: lives with husband and 5 kids (ages 6-14 in 2014)Spiritual Beliefs: ChristianLifestyle: no regular exercise, poor diet, lots of soda      Lives in a 3 story home    Right handed   Social Drivers of Health   Financial Resource Strain: Not on file  Food Insecurity: Not on file  Transportation Needs: Not on file  Physical Activity: Not on file  Stress: Not on file  Social Connections: Unknown (11/09/2021)   [facility]   Social Network    Social Network: Not on file  Intimate Partner Violence: Unknown (10/08/2021)   Received from [facility]   HITS    Physically Hurt: Not on file    Insult or Talk Down To: Not on file    Threaten Physical Harm: Not on file    Scream or Curse: Not on file    Review of Systems:    Constitutional: No weight loss, fever or chills Cardiovascular: No chest pain Respiratory: No SOB  Gastrointestinal: See HPI and otherwise negative Genitourinary: No dysuria Neurological: No headache, dizziness or syncope Musculoskeletal: No new muscle or joint pain Hematologic: No bleeding Psychiatric: No history of depression or anxiety   Physical Exam:  Vital signs: BP 130/78   Pulse 79   Ht 5' 3 (1.6 m)  Wt 222 lb 2 oz (100.8 kg)   LMP 07/12/2023 (Exact Date)   BMI 39.35 kg/m    Constitutional:   Pleasant overweight Caucasian female  appears to be in NAD, Well developed, Well nourished, alert and cooperative Head:  Normocephalic and atraumatic. Eyes:   PEERL, EOMI. No icterus. Conjunctiva pink. Ears:  Normal auditory acuity. Neck:  Supple Throat: Oral cavity and pharynx without inflammation, swelling or lesion.  Respiratory: Respirations even and unlabored. Lungs clear to auscultation bilaterally.   No wheezes, crackles, or rhonchi.  Cardiovascular: Normal S1, S2. No MRG. Regular rate and rhythm. No peripheral edema, cyanosis or pallor.  Gastrointestinal:  Soft, nondistended, moderate generalized TTP with some involuntary guarding. Normal bowel sounds. No appreciable masses or hepatomegaly. Rectal:  Not performed.  Msk:  Symmetrical without gross deformities. Without edema, no deformity or joint abnormality.  Neurologic:  Alert and  oriented x4;  grossly normal neurologically.  Skin:   Dry and intact without significant lesions or rashes. Psychiatric: Demonstrates good judgement and reason without abnormal affect or behaviors.  RELEVANT LABS AND IMAGING: CBC    Component Value Date/Time   WBC 7.8 01/29/2024 0835   RBC 4.75 01/29/2024 0835   HGB 14.5 01/29/2024 0835   HGB 10.5 (L) 08/01/2023 1024   HGB 14.0 02/19/2014 1151   HCT 44.0 01/29/2024 0835   HCT 35.0 08/01/2023 1024   PLT 234 01/29/2024 0835   PLT 298 08/01/2023 1024   MCV 92.6 01/29/2024 0835   MCV 76 (L) 08/01/2023 1024   MCH 30.5 01/29/2024 0835   MCHC 33.0 01/29/2024 0835   RDW 12.2 01/29/2024 0835   RDW 13.8 08/01/2023 1024   LYMPHSABS 3.0 08/01/2023 1024   MONOABS 0.6 05/26/2021 0417   EOSABS 0.7 (H) 08/01/2023 1024   BASOSABS 0.1 08/01/2023 1024    CMP     Component Value Date/Time   NA 137 01/29/2024 0835   NA 138 01/18/2021 0000   K 3.7 01/29/2024 0835   CL 106 01/29/2024 0835   CO2 24 01/29/2024 0835   GLUCOSE 94 01/29/2024 0835   BUN 25 (H) 01/29/2024 0835   BUN 20 01/18/2021 0000   CREATININE 0.67 01/29/2024 0835   CALCIUM  9.2 01/29/2024 0835   PROT 7.0 07/17/2023 1225   ALBUMIN 4.4 07/17/2023 1225   AST 18 07/17/2023 1225   ALT 21 07/17/2023 1225   ALKPHOS 65 07/17/2023 1225   BILITOT 0.2 07/17/2023 1225   GFRNONAA >60 01/29/2024 0835   GFRAA >60 12/11/2016 0358    Assessment: 1.  Abdominal pain: EGD in 2022 with esophagitis and reactive gastropathy, patient then underwent gastric bypass surgery and did well for a couple of years, but now symptoms are coming back with daily reflux and pain in her right upper quadrant that she is across her abdomen daily and unassociated with food, recent CT in September with constipation otherwise no etiology; consider continued gastritis/gastropathy +/- esophagitis versus functional dyspepsia and IBS 2.  Constipation: patient has constipation with overflow at times with a bowel movement every 4 days, sometimes watery, tried MiraLAX with no help, also pain down the left side of her abdomen, recent CT showing constipation and September; likely slow transit +/- IBS-C 3.  GERD: Previously on Omeprazole , has stopped this after her gastric bypass, but has daily reflux symptoms; chronic gastritis+/-gastropathy and GERD 4.  Difficult airway: Noted in patient's chart 05/24/2021, procedures will need to be at the hospital  Plan: 1.  Patient will be scheduled for EGD  and colonoscopy for diagnostic purposes.  Will be scheduled with Dr. Nandigam at her next availability at the hospital given difficult airway documentation.  Did provide the patient a detailed list risk for procedures and she agrees to proceed.  Patient will be called back with appointment time. 2.  Start Omeprazole  40 mg daily, 30 minutes for breakfast #30 with 11 refills. 3.  Start Linzess  72 mcg every morning #30 with 5 refills. 4.  Patient will have a 2-day bowel prep given history of constipation. 5.  CBC and CMP today 6.  Patient to follow in clinic per recommendations after time of procedures.  Delon Failing,  PA-C [facility] 06/06/2024, 2:23 PM  Cc: Debra Worth HERO, MD

## 2024-06-07 ENCOUNTER — Ambulatory Visit: Payer: Self-pay | Admitting: Physician Assistant

## 2024-06-07 ENCOUNTER — Ambulatory Visit: Admitting: Physician Assistant

## 2024-06-07 ENCOUNTER — Encounter: Payer: Self-pay | Admitting: Physician Assistant

## 2024-06-07 ENCOUNTER — Other Ambulatory Visit

## 2024-06-07 ENCOUNTER — Ambulatory Visit
Admission: RE | Admit: 2024-06-07 | Discharge: 2024-06-07 | Disposition: A | Source: Ambulatory Visit | Attending: Family Medicine | Admitting: Family Medicine

## 2024-06-07 VITALS — BP 130/78 | HR 79 | Ht 63.0 in | Wt 222.1 lb

## 2024-06-07 DIAGNOSIS — K5900 Constipation, unspecified: Secondary | ICD-10-CM

## 2024-06-07 DIAGNOSIS — R1084 Generalized abdominal pain: Secondary | ICD-10-CM

## 2024-06-07 DIAGNOSIS — K219 Gastro-esophageal reflux disease without esophagitis: Secondary | ICD-10-CM

## 2024-06-07 DIAGNOSIS — E041 Nontoxic single thyroid nodule: Secondary | ICD-10-CM

## 2024-06-07 DIAGNOSIS — Z9884 Bariatric surgery status: Secondary | ICD-10-CM

## 2024-06-07 LAB — COMPREHENSIVE METABOLIC PANEL WITH GFR
ALT: 22 U/L (ref 0–35)
AST: 18 U/L (ref 0–37)
Albumin: 4.5 g/dL (ref 3.5–5.2)
Alkaline Phosphatase: 71 U/L (ref 39–117)
BUN: 20 mg/dL (ref 6–23)
CO2: 27 meq/L (ref 19–32)
Calcium: 8.9 mg/dL (ref 8.4–10.5)
Chloride: 105 meq/L (ref 96–112)
Creatinine, Ser: 0.7 mg/dL (ref 0.40–1.20)
GFR: 106.96 mL/min (ref >60.00)
Glucose, Bld: 84 mg/dL (ref 70–99)
Potassium: 3.6 meq/L (ref 3.5–5.1)
Sodium: 139 meq/L (ref 135–145)
Total Bilirubin: 0.5 mg/dL (ref 0.2–1.2)
Total Protein: 7.1 g/dL (ref 6.0–8.3)

## 2024-06-07 LAB — CBC WITH DIFFERENTIAL/PLATELET
Basophils Absolute: 0.1 K/uL (ref 0.0–0.1)
Basophils Relative: 0.8 % (ref 0.0–3.0)
Eosinophils Absolute: 0.3 K/uL (ref 0.0–0.7)
Eosinophils Relative: 4.5 % (ref 0.0–5.0)
HCT: 42.8 % (ref 36.0–46.0)
Hemoglobin: 14.5 g/dL (ref 12.0–15.0)
Lymphocytes Relative: 43.4 % (ref 12.0–46.0)
Lymphs Abs: 2.9 K/uL (ref 0.7–4.0)
MCHC: 33.9 g/dL (ref 30.0–36.0)
MCV: 89.7 fl (ref 78.0–100.0)
Monocytes Absolute: 0.5 K/uL (ref 0.1–1.0)
Monocytes Relative: 7.8 % (ref 3.0–12.0)
Neutro Abs: 2.9 K/uL (ref 1.4–7.7)
Neutrophils Relative %: 43.5 % (ref 43.0–77.0)
Platelets: 229 K/uL (ref 150.0–400.0)
RBC: 4.77 Mil/uL (ref 3.87–5.11)
RDW: 13.3 % (ref 11.5–15.5)
WBC: 6.7 K/uL (ref 4.0–10.5)

## 2024-06-07 MED ORDER — NA SULFATE-K SULFATE-MG SULF 17.5-3.13-1.6 GM/177ML PO SOLN: 1.0000 | Freq: Once | ORAL | 0 refills | Status: AC

## 2024-06-07 MED ORDER — OMEPRAZOLE 40 MG PO CPDR: 40.0000 mg | DELAYED_RELEASE_CAPSULE | Freq: Every day | ORAL | 11 refills | Status: AC

## 2024-06-07 MED ORDER — LINACLOTIDE 72 MCG PO CAPS: 72.0000 ug | ORAL_CAPSULE | Freq: Every day | ORAL | 11 refills | Status: AC

## 2024-06-07 NOTE — Patient Instructions (Signed)
 Your provider has requested that you go to the basement level for lab work before leaving today. Press B on the elevator. The lab is located at the first door on the left as you exit the elevator.  We have sent the following medications to your pharmacy for you to pick up at your convenience: Omeprazole  40 mg daily 30-60 minutes before breakfast. Linzess  72 mcg daily before breakfast.   You have been scheduled for a colonoscopy. Please follow written instructions given to you at your visit today.   If you use inhalers (even only as needed), please bring them with you on the day of your procedure.  DO NOT TAKE 7 DAYS PRIOR TO TEST- Trulicity (dulaglutide) Ozempic , Wegovy (semaglutide ) Mounjaro, Zepbound (tirzepatide) Bydureon Bcise (exanatide extended release)  DO NOT TAKE 1 DAY PRIOR TO YOUR TEST Rybelsus (semaglutide ) Adlyxin (lixisenatide) Victoza (liraglutide) Byetta (exanatide) ___________________________________________________________________________

## 2024-06-11 ENCOUNTER — Ambulatory Visit: Payer: Self-pay | Admitting: Family Medicine

## 2024-06-11 DIAGNOSIS — E041 Nontoxic single thyroid nodule: Secondary | ICD-10-CM

## 2024-06-11 NOTE — Progress Notes (Signed)
 Thyroid  ultrasound is a little bit big and should be biopsied according to radiology.  I have ordered an ultrasound-guided thyroid  biopsy.  You should hear soon about scheduling.

## 2024-06-12 NOTE — Progress Notes (Signed)
 My understanding is the biopsy is designed to understand what it is and help a surgical team know how to remove it and how much margin to get.  If you not sure you want a go for the biopsy right now I would recommend talking it over with Dr. Kennyth when you see him on the 19th.

## 2024-06-21 ENCOUNTER — Encounter: Payer: Self-pay | Admitting: Family Medicine

## 2024-06-21 ENCOUNTER — Ambulatory Visit: Admitting: Family Medicine

## 2024-06-21 VITALS — BP 132/88 | HR 80 | Temp 98.4°F | Ht 63.0 in | Wt 224.6 lb

## 2024-06-21 DIAGNOSIS — I10 Essential (primary) hypertension: Secondary | ICD-10-CM

## 2024-06-21 DIAGNOSIS — E282 Polycystic ovarian syndrome: Secondary | ICD-10-CM | POA: Diagnosis not present

## 2024-06-21 DIAGNOSIS — K5900 Constipation, unspecified: Secondary | ICD-10-CM

## 2024-06-21 DIAGNOSIS — H6990 Unspecified Eustachian tube disorder, unspecified ear: Secondary | ICD-10-CM | POA: Insufficient documentation

## 2024-06-21 DIAGNOSIS — K219 Gastro-esophageal reflux disease without esophagitis: Secondary | ICD-10-CM

## 2024-06-21 DIAGNOSIS — G4733 Obstructive sleep apnea (adult) (pediatric): Secondary | ICD-10-CM

## 2024-06-21 DIAGNOSIS — E041 Nontoxic single thyroid nodule: Secondary | ICD-10-CM | POA: Insufficient documentation

## 2024-06-21 MED ORDER — PREDNISONE 20 MG PO TABS: 20.0000 mg | ORAL_TABLET | Freq: Every day | ORAL | 0 refills | Status: AC

## 2024-06-21 MED ORDER — SPIRONOLACTONE 100 MG PO TABS: 100.0000 mg | ORAL_TABLET | Freq: Every day | ORAL | 3 refills | Status: AC

## 2024-06-21 NOTE — Assessment & Plan Note (Signed)
 On omeprazole  40 mg daily now per GI.

## 2024-06-21 NOTE — Patient Instructions (Addendum)
 It was very nice to see you today!  VISIT SUMMARY: You came in for a follow-up visit to address multiple ongoing health issues, including knee pain, PCOS, gastrointestinal problems, sleep apnea, thyroid  concerns, ear issues, joint pain, and migraines. We discussed your symptoms, current treatments, and future plans for managing these conditions.  YOUR PLAN: PATELLOFEMORAL SYNDROME: You have been experiencing right knee pain and tightness for the past three weeks, likely due to patellofemoral syndrome. -Take prednisone  for one week to reduce inflammation. -Use ice and a compression wrap on your knee. -Follow the provided handout with stretches and exercises. -If symptoms do not improve in one to two weeks, consider getting x-rays or a referral to sports medicine.  POLYCYSTIC OVARIAN SYNDROME (PCOS): Your PCOS management continues with spironolactone . Recent weight gain may be related to fluid retention and inflammation. -Increase spironolactone  to 100 mg daily to address fluid retention. -Monitor your weight and fluid retention over the next few weeks. -Discuss with gynecology if fluid retention persists.  CONSTIPATION AND IRRITABLE BOWEL SYNDROME: You are experiencing constipation and IBS symptoms. -Continue taking Linzess  and omeprazole . -Await scheduling of EGD and colonoscopy.  OBSTRUCTIVE SLEEP APNEA: Your sleep apnea was confirmed by a home sleep study. Symptoms have improved with weight loss but are still present. -Continue with your current treatment plan for sleep apnea.  THYROID  NODULE: You have a thyroid  nodule and a family history of thyroid  cancer. You are hesitant about the biopsy. -Discuss the risks and benefits of biopsy versus surgery. -Proceed with a thyroid  biopsy under ultrasound guidance. -Discuss results with an endocrinologist or ENT as needed.  DYSFUNCTION OF EUSTACHIAN TUBE: You have Eustachian tube dysfunction with improper drainage. -Continue follow-up with ENT  for a hearing test and further management.  POLYARTHRALGIA: You have joint pain managed with amitriptyline . Recent MRI showed potential nerve impingement. -Continue taking amitriptyline  50 mg nightly. -Proceed with the planned epidural steroid injection.  MIGRAINE: Your migraines are being managed with medication. -Continue with your current migraine management plan.  Return in about 3 months (around 09/19/2024).   Take care, Dr Kennyth  PLEASE NOTE:  If you had any lab tests, please let us  know if you have not heard back within a few days. You may see your results on mychart before we have a chance to review them but we will give you a call once they are reviewed by us .   If we ordered any referrals today, please let us  know if you have not heard from their office within the next week.   If you had any urgent prescriptions sent in today, please check with the pharmacy within an hour of our visit to make sure the prescription was transmitted appropriately.   Please try these tips to maintain a healthy lifestyle:  Eat at least 3 REAL meals and 1-2 snacks per day.  Aim for no more than 5 hours between eating.  If you eat breakfast, please do so within one hour of getting up.   Each meal should contain half fruits/vegetables, one quarter protein, and one quarter carbs (no bigger than a computer mouse)  Cut down on sweet beverages. This includes juice, soda, and sweet tea.   Drink at least 1 glass of water  with each meal and aim for at least 8 glasses per day  Exercise at least 150 minutes every week.

## 2024-06-21 NOTE — Progress Notes (Signed)
 "  Debra Miller is a 43 y.o. female who presents today for an office visit.  Assessment/Plan:  New/Acute Problems: Right Knee Pain No red flags.  May have mild patellofemoral syndrome.  May have some underlying osteoarthritis as well.  We will treat conservatively with home exercise program, ice, compression and a short course of prednisone .  She cannot take NSAIDs due to her gastric bypass history. she will let us  know if not improving and would consider imaging versus having her follow back up with sports medicine at that time.  Chronic Problems Addressed Today: PCOS (polycystic ovarian syndrome) She is now on estradiol  patch per gynecology.  She has not noticed much change in symptoms though does have a little bit of increased fluid retention recently.  Discussed with patient this is likely due to the estradiol  though could be due to many other factors as well.  We are increasing her spironolactone  to 100 mg daily.  She has been on this dose in the past and has done well with it.  She will follow-up with us  in a few weeks via MyChart to let us  know how she is doing though if she is still having ongoing fluid retention issues she would need to discuss with her gynecologist about changing the dose of her estradiol .  Essential hypertension Blood pressure at goal today.  We are increasing her dose of spironolactone  as above.  Constipation Continue management per gastroenterology.  She is now on Linzess  and has upcoming colonoscopy planned.  ETD (eustachian tube dysfunction) No abnormalities on exam today.  She will continue management per ENT and is possibly going to be getting tympanostomy tubes placed soon.  OSA (obstructive sleep apnea) Recent sleep study confirmed OSA.  She is working with sleep medicine and will be starting CPAP soon.  Thyroid  nodule Last recent incidental finding of thyroid  nodule which was confirmed on ultrasound.  She was initially hesitant about getting a biopsy due  to family history and preferred for complete excision however discussed with patient that a FNA would be the next step per standard of care.  She is agreeable to this and will schedule this soon.  GERD (gastroesophageal reflux disease) On omeprazole  40 mg daily now per GI.     Subjective:  HPI:  See assessment / plan for status of chronic conditions.  Patient is here today for follow-up.  I last saw her about 3 months ago.  At that time we discussed multiple issues including polyarthralgia, migraines, constipation, and PCOS.  For her polyarthralgia we increased her amitriptyline  to 50 mg nightly and had her follow-up with sports medicine.  She has seen sports medicine a few times since our last visit and did undergo cervical spine MRI which did show a couple of areas of potential nerve impingement and they are planning on pursuing possible epidural steroid injection.  We referred her to GI for her constipation and IBS symptoms.  They started her on Linzess  and omeprazole  and are planning on performing diagnostic EGD and colonoscopy soon.   We increased her spironolactone  to 50 mg daily for her PCOS.  She saw gynecology and they obtained labs which were normal however they started her on estradiol  patch to see if this would improve some of her symptoms.  Discussed the use of AI scribe software for clinical note transcription with the patient, who gave verbal consent to proceed.  History of Present Illness JAIMEE Miller is a 43 year old female with polyarthralgia who presents for  follow-up.  She has been experiencing persistent knee pain for the past three weeks, described as tight and pulling, especially when bending the knee or walking. No recent trauma or falls have occurred. Stretching exacerbates the pain, though there is slight improvement over time. Significant discomfort persists with certain movements.  She has been consulting sports medicine specialists and underwent a cervical spine  MRI, which indicated potential nerve impingement. Plans for an epidural steroid injection are underway.  For her migraines, her amitriptyline  was increased to fifty milligrams daily.  Regarding gastrointestinal issues, she was referred to GI for constipation and IBS symptoms. She was started on Linzess  and omeprazole , with plans for a diagnostic EGD and colonoscopy.  She also started an estradiol  patch and has not noticed any changes yet after three to four weeks of use.  She has been seeing an ENT for ear issues, where it was suggested she might need tubes due to improper drainage. A hearing test is planned to further evaluate her condition.  A home sleep study confirmed sleep apnea. Some improvement in her condition has been noted since her weight loss, but treatment is still required.  She has a family history of thyroid  cancer and has concerns about a thyroid  nodule. A biopsy has been recommended, but she is apprehensive about the procedure due to her family history and personal concerns.  She reports increased swelling in her legs over the past month, worsening by the end of the day, with a tingling sensation similar to a limb falling asleep. No recent dietary changes have been made, and she has been mindful of her sodium intake.  She has experienced a weight gain of approximately 22 pounds over the last three months. She is concerned about this gain following her significant weight loss post-surgery.         Objective:  Physical Exam: BP 132/88   Pulse 80   Temp 98.4 F (36.9 C) (Temporal)   Ht 5' 3 (1.6 m)   Wt 224 lb 9.6 oz (101.9 kg)   LMP 07/12/2023   SpO2 98%   BMI 39.79 kg/m   Wt Readings from Last 3 Encounters:  06/21/24 224 lb 9.6 oz (101.9 kg)  06/07/24 222 lb 2 oz (100.8 kg)  06/05/24 221 lb 12.8 oz (100.6 kg)    Gen: No acute distress, resting comfortably HEENT: tympanic membranes retracted bilaterally CV: Regular rate and rhythm with no murmurs  appreciated Pulm: Normal work of breathing, clear to auscultation bilaterally with no crackles, wheezes, or rhonchi MUSCULOSKELETAL: - Right knee without obvious abnormality.  Crepitus with active and passive range of motion.  Joint line tenderness bilaterally.  Mild effusion noted.  Stable to varus and valgus stress.  Anterior and posterior drawer signs negative. Neuro: Grossly normal, moves all extremities Psych: Normal affect and thought content  Time Spent: 45 minutes of total time was spent on the date of the encounter performing the following actions: chart review prior to seeing the patient, obtaining history, performing a medically necessary exam, counseling on the treatment plan, placing orders, and documenting in our EHR.        Worth HERO. Kennyth, MD 06/21/2024 9:39 AM  "

## 2024-06-21 NOTE — Assessment & Plan Note (Signed)
 Continue management per gastroenterology.  She is now on Linzess  and has upcoming colonoscopy planned.

## 2024-06-21 NOTE — Assessment & Plan Note (Signed)
 Blood pressure at goal today.  We are increasing her dose of spironolactone  as above.

## 2024-06-21 NOTE — Assessment & Plan Note (Signed)
 She is now on estradiol  patch per gynecology.  She has not noticed much change in symptoms though does have a little bit of increased fluid retention recently.  Discussed with patient this is likely due to the estradiol  though could be due to many other factors as well.  We are increasing her spironolactone  to 100 mg daily.  She has been on this dose in the past and has done well with it.  She will follow-up with us  in a few weeks via MyChart to let us  know how she is doing though if she is still having ongoing fluid retention issues she would need to discuss with her gynecologist about changing the dose of her estradiol .

## 2024-06-21 NOTE — Assessment & Plan Note (Signed)
 No abnormalities on exam today.  She will continue management per ENT and is possibly going to be getting tympanostomy tubes placed soon.

## 2024-06-21 NOTE — Assessment & Plan Note (Signed)
 Recent sleep study confirmed OSA.  She is working with sleep medicine and will be starting CPAP soon.

## 2024-06-21 NOTE — Assessment & Plan Note (Signed)
 Last recent incidental finding of thyroid  nodule which was confirmed on ultrasound.  She was initially hesitant about getting a biopsy due to family history and preferred for complete excision however discussed with patient that a FNA would be the next step per standard of care.  She is agreeable to this and will schedule this soon.

## 2024-06-24 ENCOUNTER — Telehealth: Payer: Self-pay | Admitting: *Deleted

## 2024-06-24 DIAGNOSIS — K5900 Constipation, unspecified: Secondary | ICD-10-CM

## 2024-06-24 DIAGNOSIS — Z9884 Bariatric surgery status: Secondary | ICD-10-CM

## 2024-06-24 DIAGNOSIS — R1084 Generalized abdominal pain: Secondary | ICD-10-CM

## 2024-06-24 DIAGNOSIS — K219 Gastro-esophageal reflux disease without esophagitis: Secondary | ICD-10-CM

## 2024-06-24 NOTE — Discharge Instructions (Signed)

## 2024-06-24 NOTE — Telephone Encounter (Signed)
-----   Message from CMA Gautam Langhorst  V sent at 06/07/2024 11:33 AM EST ----- Regarding: [facility] Patient needs an ECL at hospital, difficult airway 08/2024

## 2024-06-24 NOTE — Telephone Encounter (Signed)
 Left message for patient to call office.

## 2024-06-25 ENCOUNTER — Other Ambulatory Visit: Payer: Self-pay | Admitting: *Deleted

## 2024-06-25 ENCOUNTER — Ambulatory Visit
Admission: RE | Admit: 2024-06-25 | Discharge: 2024-06-25 | Disposition: A | Discharge status: Home / Self Care | Source: Ambulatory Visit | Attending: Family Medicine | Admitting: Family Medicine

## 2024-06-25 DIAGNOSIS — R1084 Generalized abdominal pain: Secondary | ICD-10-CM

## 2024-06-25 DIAGNOSIS — Z9884 Bariatric surgery status: Secondary | ICD-10-CM

## 2024-06-25 DIAGNOSIS — K219 Gastro-esophageal reflux disease without esophagitis: Secondary | ICD-10-CM

## 2024-06-25 DIAGNOSIS — M542 Cervicalgia: Secondary | ICD-10-CM

## 2024-06-25 DIAGNOSIS — K5900 Constipation, unspecified: Secondary | ICD-10-CM

## 2024-06-25 DIAGNOSIS — R29898 Other symptoms and signs involving the musculoskeletal system: Secondary | ICD-10-CM

## 2024-06-25 MED ORDER — TRIAMCINOLONE ACETONIDE 40 MG/ML IJ SUSP (RADIOLOGY)
60.0000 mg | Freq: Once | INTRAMUSCULAR | Status: AC
  Administered 2024-06-25: 60 mg via EPIDURAL

## 2024-06-25 MED ORDER — IOPAMIDOL (ISOVUE-M 300) INJECTION 61%
1.0000 mL | Freq: Once | INTRAMUSCULAR | Status: AC | PRN
  Administered 2024-06-25: 1 mL via EPIDURAL

## 2024-06-25 NOTE — Telephone Encounter (Signed)
 Spoke with patient and informed her of procedure on 08/15/24 @ 9 am.

## 2024-06-25 NOTE — Telephone Encounter (Signed)
 Left message for patient to call office.

## 2024-07-10 NOTE — Progress Notes (Unsigned)
" ° °  GYNECOLOGY  VISIT  CC:   No chief complaint on file.   HPI: 44 y.o. [id number] Married White or Caucasian female here for ***.  Patient's last menstrual period was 07/12/2023.  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   History of type 2 diabetes mellitus    since gastric bypass 05-24-2021 ,  A1c normalized,  stopped meds 09/ 2023   Hyperlipidemia, mixed    Hypertension    IDA (iron  deficiency anemia)    MDD (major depressive disorder)    Menorrhagia with irregular cycle    Migraines    Mild intermittent asthma    Mild obstructive sleep apnea    (07-17-2023  pt stated was using cpap before and after gastric bypass surgery, but stopped after wt loss,  stated  is rescheduled for retesting ) sleep study w/ dr dohmeier--- 12-27-2019 in epic mild osa, AHI 10/hr , recommmended cpap   Obesity    PCOS (polycystic ovarian syndrome)    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 stechschulte;    roux-en-y   Seasonal allergies    Urine incontinence    Uterine leiomyoma    Wears glasses     MEDS:  Reviewed in EPIC  ALLERGIES: Ceclor [cefaclor], Nsaids, and Pediapred [prednisolone sodium phosphate ]  SH:  ***  ROS  PHYSICAL EXAMINATION:    LMP 07/12/2023     General appearance: alert, cooperative and appears stated age Neck: no adenopathy, supple, symmetrical, trachea midline and thyroid  {CHL AMB PHY EX THYROID  NORM DEFAULT:[phone]::normal to inspection and palpation} CV:  {Exam; heart brief:31539} Lungs:  {pe lungs ob:314451} Breasts: {Exam; breast:13139::normal appearance, no masses  or tenderness} Abdomen: soft, non-tender; bowel sounds normal; no masses,  no organomegaly Lymph:  no inguinal LAD noted  Pelvic: External genitalia:  no lesions              Urethra:  normal appearing urethra with no masses, tenderness or lesions              Bartholins and Skenes: normal                 Vagina: {exam; pelvic vaginal:30846}              Cervix: {CHL AMB PHY EX CERVIX NORM [phone]::no lesions}              Bimanual Exam:  Uterus:  {CHL AMB PHY EX UTERUS NORM [phone]::normal size, contour, position, consistency, mobility, non-tender}              Adnexa: {CHL AMB PHY EX ADNEXA NO MASS DEFAULT:[phone]::no mass, fullness, tenderness}              Rectovaginal: {yes no:[id number]}.  Confirms.              Anus:  normal sphincter tone, no lesions  Chaperone was present for exam.  Assessment/Plan: There are no diagnoses linked to this encounter.  "

## 2024-07-11 ENCOUNTER — Ambulatory Visit (HOSPITAL_BASED_OUTPATIENT_CLINIC_OR_DEPARTMENT_OTHER): Admitting: Obstetrics & Gynecology

## 2024-07-11 ENCOUNTER — Ambulatory Visit (INDEPENDENT_AMBULATORY_CARE_PROVIDER_SITE_OTHER): Admitting: Physician Assistant

## 2024-07-11 ENCOUNTER — Encounter (INDEPENDENT_AMBULATORY_CARE_PROVIDER_SITE_OTHER): Payer: Self-pay | Admitting: Physician Assistant

## 2024-07-11 ENCOUNTER — Other Ambulatory Visit (INDEPENDENT_AMBULATORY_CARE_PROVIDER_SITE_OTHER): Payer: Self-pay | Admitting: Physician Assistant

## 2024-07-11 ENCOUNTER — Ambulatory Visit (INDEPENDENT_AMBULATORY_CARE_PROVIDER_SITE_OTHER): Admitting: Audiology

## 2024-07-11 VITALS — BP 130/84 | HR 71 | Temp 99.0°F

## 2024-07-11 DIAGNOSIS — H9213 Otorrhea, bilateral: Secondary | ICD-10-CM | POA: Diagnosis not present

## 2024-07-11 DIAGNOSIS — M26603 Bilateral temporomandibular joint disorder, unspecified: Secondary | ICD-10-CM

## 2024-07-11 DIAGNOSIS — H93A3 Pulsatile tinnitus, bilateral: Secondary | ICD-10-CM

## 2024-07-11 DIAGNOSIS — H9193 Unspecified hearing loss, bilateral: Secondary | ICD-10-CM | POA: Diagnosis not present

## 2024-07-11 DIAGNOSIS — H903 Sensorineural hearing loss, bilateral: Secondary | ICD-10-CM

## 2024-07-11 DIAGNOSIS — H93293 Other abnormal auditory perceptions, bilateral: Secondary | ICD-10-CM | POA: Diagnosis not present

## 2024-07-11 DIAGNOSIS — H9203 Otalgia, bilateral: Secondary | ICD-10-CM

## 2024-07-11 DIAGNOSIS — H6993 Unspecified Eustachian tube disorder, bilateral: Secondary | ICD-10-CM

## 2024-07-11 DIAGNOSIS — M26623 Arthralgia of bilateral temporomandibular joint: Secondary | ICD-10-CM

## 2024-07-11 MED ORDER — CIPROFLOXACIN-DEXAMETHASONE 0.3-0.1 % OT SUSP: 4.0000 [drp] | Freq: Two times a day (BID) | OTIC | 0 refills | Status: AC

## 2024-07-11 NOTE — Progress Notes (Signed)
 Dear Dr. Kennyth, Here is my assessment for our mutual patient, Debra Miller. Thank you for allowing me the opportunity to care for your patient. Please do not hesitate to contact me should you have any other questions. Sincerely, Chyrl Cohen PA-C  Otolaryngology Clinic Note Referring provider: Dr. Kennyth HPI:  Debra Miller is a 44 y.o. female kindly referred by Dr. Kennyth   Discussed the use of AI scribe software for clinical note transcription with the patient, who gave verbal consent to proceed.  History of Present Illness   Debra Miller is a 44 year old female who presents with persistent bilateral otologic symptoms.  She was last seen in the office on 04/23/2024, below is a recap of that encounter.   The patient is a 44 year old female seen in our office for evaluation of ear pressure.  She notes several years ago she started to have pressure in her ears, left worse than right.  She notes presently they feel like they are full of water , she notes when she wakes up in the morning she feels like she has liquid in her left ear.  She notes this improves as the day goes on.  She does have clicking and popping in both ears.  She notes when she was a child she had tympanostomy tubes.  She has had a couple of acute otitis media and otitis externa infections as an adult but no reoccurrence.  She notes she is a diabetic that is well-controlled.  No history of trauma to the ears other than tympanostomy tubes.  She has a history of seasonal allergies and takes Xyzal  for this.   Update 07/11/2024  She reports ongoing bilateral ear symptoms including cracking, popping, tinnitus, otalgia, aural fullness, and a sensation of water  in the ears. Most mornings, she notes otorrhea from both ears, described as either yellow or clear, which is often visible on her pillow. She experiences constant pruritus of the ears and has recently developed a whooshing or wind-like sound. She does not use cotton swabs but  acknowledges possible water  exposure during showering. No specific alleviating factors for the pruritus have been identified. Audiometry has demonstrated mild high-frequency hearing loss.  She experiences bilateral temporomandibular joint pain and pressure, particularly with jaw movement, radiating to the lower jaw. Morning jaw pain and tension are noted, with associated otalgia and aural pressure.  She underwent MRI of the neck for evaluation of neck pain, which incidentally revealed a thyroid  nodule. Subsequent ultrasound was performed. She has not yet undergone biopsy and expresses uncertainty about proceeding, citing a family history of thyroid  cancer in her grandmother.           Independent Review of Additional Tests or Records:  Audiological evaluation 07/11/2024  Otoscopy: Right ear: Clear external ear canal and notable landmarks visualized on the tympanic membrane. Left ear:  Clear external ear canal and notable landmarks visualized on the tympanic membrane. Of note- some scarring in the eardrums noted.    Tympanometry: Right ear: Type A - Normal external ear canal volume with normal middle ear pressure and normal tympanic membrane compliance. Findings are consistent with normal middle ear function. Left ear: Type A - Normal external ear canal volume with normal middle ear pressure and normal tympanic membrane compliance. Findings are consistent with normal middle ear function.   Hearing Evaluation The hearing test results were completed under headphones and results are deemed to be of good reliability. Test technique:  conventional     Pure tone Audiometry: Both  ears- Normal hearing from [phone], then  presumably sensorineural hearing loss at 8000 Hz.     Speech Audiometry: Right ear- Speech Reception Threshold (SRT) was obtained at 15 dBHL. Left ear-Speech Reception Threshold (SRT) was obtained at 15 dBHL.   Word Recognition Score Tested using NU-6 (recorded) Right  ear: 100% was obtained at a presentation level of 60 dBHL with contralateral masking which is deemed as  excellent. Left ear: 100% was obtained at a presentation level of 60 dBHL with contralateral masking which is deemed as  excellent.   Impression: There is not a significant difference in pure-tone thresholds between ears. There is not a significant difference in the word recognition score in between ears.    Recommendations: Follow up with ENT as scheduled. Return for a hearing evaluation at least in 2-3 years, before if concerns with hearing changes arise or per MD recommendation.     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   History of type 2 diabetes mellitus    since gastric bypass 05-24-2021 ,  A1c normalized,  stopped meds 09/ 2023   Hyperlipidemia, mixed    Hypertension    IDA (iron  deficiency anemia)    MDD (major depressive disorder)    Menorrhagia with irregular cycle    Migraines    Mild intermittent asthma    Mild obstructive sleep apnea    (07-17-2023  pt stated was using cpap before and after gastric bypass surgery, but stopped after wt loss,  stated  is rescheduled for retesting ) sleep study w/ dr dohmeier--- 12-27-2019 in epic mild osa, AHI 10/hr , recommmended cpap   Obesity    PCOS (polycystic ovarian syndrome)    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 stechschulte;    roux-en-y   Seasonal allergies    Urine incontinence    Uterine leiomyoma    Wears glasses      Past Surgical History:  Procedure Laterality Date   CHOLECYSTECTOMY  N/A 01/30/2024   Procedure: LAPAROSCOPIC CHOLECYSTECTOMY with INTRAOP CHOLANGIOGRAM;  Surgeon: Lyndel Deward PARAS, MD;  Location: [facility];  Service: General;  Laterality: N/A;   CYSTOSCOPY N/A 07/25/2023   Procedure: CYSTOSCOPY;  Surgeon: Cleotilde Ronal RAMAN, MD;  [facility];  Service: Gynecology;  Laterality: N/A;   DILATION AND EVACUATION  09/15/1999   @ [facility] by dr nikki;  missed ab   KNEE ARTHROSCOPY Left 2018   LAPAROSCOPY WITH TUBAL LIGATION Bilateral 05/08/2007   @ [facility] by dr denton;   cautery used   RADIOLOGY WITH ANESTHESIA N/A 09/22/2020   Procedure: MRI WITH ANESTHESIA CERVICAL SPINE WITHOUT AND BRAIN WITHOUT;  Surgeon: Radiologist, Medication, MD;  Location: [facility];  Service: Radiology;  Laterality: N/A;   RADIOLOGY WITH ANESTHESIA N/A 05/18/2021   Procedure: MRI WITH ANESTHESIA  THORACIC WITHOUT CONTRAST AND LUMBAR WITHOUT CONTRAST;  Surgeon: Radiologist, Medication, MD;  Location: [facility];  Service: Radiology;  Laterality: N/A;   ROUX-EN-Y GASTRIC BYPASS  05/24/2021   @ [facility] by Dr. Lyndel;   Robot Assisted Roux-En-Y Gastric Bypass and Upper Endoscopy   TONSILLECTOMY AND ADENOIDECTOMY  1990   TOTAL LAPAROSCOPIC HYSTERECTOMY WITH SALPINGECTOMY Bilateral 07/25/2023   Procedure: TOTAL LAPAROSCOPIC HYSTERECTOMY WITH SALPINGECTOMY;  Surgeon: Cleotilde Ronal RAMAN, MD;  Location: [facility];  Service: Gynecology;  Laterality: Bilateral;   TYMPANOSTOMY TUBE PLACEMENT Bilateral    child    Family History  Problem Relation Age of Onset   Arthritis Mother    High Cholesterol Mother    Hypertension Mother    Diabetes Mother    Rheum arthritis Mother    Asthma Mother    Hyperlipidemia Mother    Migraines Mother    Thyroid  disease Mother    Colon polyps Mother    Arthritis Maternal Grandmother    Breast cancer Maternal Grandmother    High Cholesterol Maternal Grandmother    Stroke Maternal Grandmother    Hypertension Maternal Grandmother    Migraines  Maternal Grandmother    Colon polyps Sister    Colon polyps Maternal Aunt    Colon cancer Neg Hx    Esophageal cancer Neg Hx    Rectal cancer Neg Hx    Stomach cancer Neg Hx      Social Connections: Unknown (11/09/2021)   Received from [employer]   Social Network    Social Network: Not on file     Current Medications[1]   Physical Exam:   BP 130/84   Pulse 71   Temp 99 F (37.2 C)   LMP 07/12/2023   SpO2 96%   Pertinent Findings  CN II-XII grossly intact Bilateral EAC clear and TM intact with well pneumatized middle ear spaces, sclerosis along the right TM Anterior rhinoscopy: Septum midline; bilateral inferior turbinates with no hypertrophy No lesions of oral cavity/oropharynx; dentition within normal limits No obviously palpable neck masses/lymphadenopathy/thyromegaly Pain at bilateral TMJ No respiratory distress or stridor        Seprately Identifiable Procedures:  None  Impression & Plans:  Matalyn Nawaz is a 44 y.o. female with the following   Assessment and Plan    Otologic symptoms (aural fullness, otorrhea, pruritus, tinnitus) Persistent bilateral otologic symptoms with intact tympanic membranes, no infection, and mild high-frequency hearing loss. Otorrhea etiology unclear, possibly due to water  exposure or canal inflammation. Tinnitus likely multifactorial. No middle ear effusion or infection.  - Use cotton ball with Vaseline in external auditory canal during showers for 3-4 days to reduce water  exposure and monitor otorrhea improvement. - Prescribed topical steroid otic drops for 5-7 days for pruritus and canal inflammation, avoiding prolonged use. - Advised intermittent steroid drop use for symptom control, cautioning against extended duration. - Provided guidance on pulsatile tinnitus, instructed to return if persistent pulsatile symptoms develop. - Encouraged follow-up if symptoms persist or worsen.  Temporomandibular joint disorder Jaw pain and  pressure with otalgia and aural pressure, consistent with TMJ dysfunction. Bilateral TMJ tenderness noted. TMJ joint injections not recommended due to potential symptom exacerbation. - Recommended conservative management, including dental evaluation and mouth guard use. - Advised against TMJ joint injections.           - f/u PRN   Thank you for allowing me the opportunity to care for your patient. Please do not hesitate to contact me should you have any other questions.  Sincerely, Chyrl Cohen PA-C [facility] Phone: [phone] Fax: [phone]  07/11/2024, 12:09 PM        [1]  Current Outpatient Medications:    albuterol  (VENTOLIN  HFA) 108 (90 Base) MCG/ACT inhaler, Inhale 2 puffs into the lungs every 6 (six) hours as needed for wheezing or shortness of breath., Disp: 54 g, Rfl: 3   amitriptyline  (ELAVIL ) 50 MG tablet, Take 1 tablet (50 mg total) by mouth at bedtime., Disp: 90 tablet, Rfl: 3   Calcium Carb-Cholecalciferol  (CALCIUM 500/D) 500-10 MG-MCG CHEW, Chew by mouth 3 (three) times daily. Pt stated one chew every 2 hours (Patient taking differently: Chew 1,500 mg by mouth daily.), Disp: , Rfl:    ciprofloxacin -dexamethasone  (CIPRODEX ) OTIC suspension, Place 4 drops into both ears 2 (two) times daily for 7 days., Disp: 2.8 mL, Rfl: 0   estradiol  (VIVELLE -DOT) 0.0375 MG/24HR, Place 1 patch onto the skin 2 (two) times a week., Disp: 24 patch, Rfl: 1   ketoconazole  (NIZORAL ) 2 % cream, APPLY TO AFFECTED AREA TWICE A DAY, Disp: 60 g, Rfl: 0   levocetirizine (XYZAL  ALLERGY 24HR) 5 MG tablet, Take 1 tablet (5 mg total) by mouth every evening., Disp: 90 tablet, Rfl: 3   linaclotide  (LINZESS ) 72 MCG capsule, Take 1 capsule (72 mcg total) by mouth daily before breakfast., Disp: 30 capsule, Rfl: 11   LORazepam  (ATIVAN ) 0.5 MG tablet, 1-2 tabs 30 - 60 min prior to MRI. Do not drive with this medicine., Disp: 4 tablet, Rfl: 0   loteprednol (LOTEMAX) 0.5 % ophthalmic  suspension, INSTILL 1 DROP INTO BOTH EYES FOUR TIMES A DAY Ophthalmic; Duration: 18 Days, Disp: , Rfl:    Multiple Vitamins-Minerals (BARIATRIC MULTIVITAMINS) CAPS, Take 1 capsule by mouth in the morning, at noon, in the evening, and at bedtime. Pt stated takes on capsule 2 hours in between each capsule (Patient taking differently: Take 1 capsule by mouth daily.), Disp: , Rfl:    Na Sulfate-K Sulfate-Mg Sulfate concentrate (SUPREP) 17.5-3.13-1.6 GM/177ML SOLN, SMARTSIG:1 Kit(s) By Mouth Once, Disp: , Rfl:    omeprazole  (PRILOSEC) 40 MG capsule, Take 1 capsule (40 mg total) by mouth daily., Disp: 30 capsule, Rfl: 11   predniSONE  (DELTASONE ) 20 MG tablet, Take 1 tablet (20 mg total) by mouth daily with breakfast., Disp: 7 tablet, Rfl: 0   spironolactone  (ALDACTONE ) 100 MG tablet, Take 1 tablet (100 mg total) by mouth daily., Disp: 90 tablet, Rfl: 3   valACYclovir  (VALTREX ) 1000 MG tablet, Take 1 tablet (1,000 mg total) by mouth daily., Disp: 90 tablet, Rfl: 3   Vitamin D , Ergocalciferol , (DRISDOL ) 1.25 MG (50000 UNIT) CAPS capsule, Take 1 capsule (50,000 Units total) by mouth 2 (two) times a week., Disp: 24 capsule, Rfl: 0

## 2024-07-11 NOTE — Progress Notes (Signed)
" °  [address], [address] [place], [place] [address] [phone]  Audiological Evaluation    Name: Debra Miller     DOB:   May 06, 1981      MRN:   [id number]                                                                                     Service Date: 07/11/2024     Accompanied by: self    Patient comes today after [name], PA-C sent a referral for a hearing evaluation due to concerns with ear pain.   Symptoms Yes Details  Hearing loss  [x]  Muffled hearing in both ears  Tinnitus  [x]  Both ears, comes and goes - reports different tones  Ear pain/ infections/pressure  [x]  Both sides down to the throat; reports allergies  Balance problems  []    Noise exposure history  []    Previous ear surgeries  [x]  Ventilation tubes as a child  Family history of hearing loss  []    Amplification  []    Other  []      Otoscopy: Right ear: Clear external ear canal and notable landmarks visualized on the tympanic membrane. Left ear:  Clear external ear canal and notable landmarks visualized on the tympanic membrane. Of note- some scarring in the eardrums noted.   Tympanometry: Right ear: Type A - Normal external ear canal volume with normal middle ear pressure and normal tympanic membrane compliance. Findings are consistent with normal middle ear function. Left ear: Type A - Normal external ear canal volume with normal middle ear pressure and normal tympanic membrane compliance. Findings are consistent with normal middle ear function.  Hearing Evaluation The hearing test results were completed under headphones and results are deemed to be of good reliability. Test technique:  conventional    Pure tone Audiometry: Both ears- Normal hearing from [phone] Hz, then  presumably sensorineural hearing loss at 8000 Hz.    Speech Audiometry: Right ear- Speech Reception Threshold (SRT) was obtained at 15 dBHL. Left ear-Speech Reception Threshold (SRT) was obtained at 15 dBHL.   Word  Recognition Score Tested using NU-6 (recorded) Right ear: 100% was obtained at a presentation level of 60 dBHL with contralateral masking which is deemed as  excellent. Left ear: 100% was obtained at a presentation level of 60 dBHL with contralateral masking which is deemed as  excellent.   Impression: There is not a significant difference in pure-tone thresholds between ears. There is not a significant difference in the word recognition score in between ears.    Recommendations: Follow up with ENT as scheduled. Return for a hearing evaluation at least in 2-3 years, before if concerns with hearing changes arise or per MD recommendation.   Debra Miller MARIE LEROUX-MARTINEZ, AUD  "

## 2024-07-12 ENCOUNTER — Other Ambulatory Visit (HOSPITAL_BASED_OUTPATIENT_CLINIC_OR_DEPARTMENT_OTHER): Payer: Self-pay | Admitting: Obstetrics & Gynecology

## 2024-07-12 DIAGNOSIS — N951 Menopausal and female climacteric states: Secondary | ICD-10-CM

## 2024-07-14 ENCOUNTER — Other Ambulatory Visit: Payer: Self-pay | Admitting: Family Medicine

## 2024-07-16 ENCOUNTER — Encounter: Admitting: Gastroenterology

## 2024-07-17 ENCOUNTER — Other Ambulatory Visit (HOSPITAL_BASED_OUTPATIENT_CLINIC_OR_DEPARTMENT_OTHER): Payer: Self-pay | Admitting: Obstetrics & Gynecology

## 2024-07-17 DIAGNOSIS — R7989 Other specified abnormal findings of blood chemistry: Secondary | ICD-10-CM

## 2024-07-19 ENCOUNTER — Encounter: Payer: Self-pay | Admitting: Audiology

## 2024-07-21 ENCOUNTER — Other Ambulatory Visit (HOSPITAL_BASED_OUTPATIENT_CLINIC_OR_DEPARTMENT_OTHER): Payer: Self-pay | Admitting: Obstetrics & Gynecology

## 2024-07-21 DIAGNOSIS — R7989 Other specified abnormal findings of blood chemistry: Secondary | ICD-10-CM

## 2024-08-05 ENCOUNTER — Encounter (HOSPITAL_COMMUNITY): Payer: Self-pay | Admitting: Gastroenterology

## 2024-08-05 NOTE — Progress Notes (Signed)
 Attempted to obtain medical history for pre op call via telephone, unable to reach at this time. HIPAA compliant voicemail message left requesting return call to pre surgical testing department.

## 2024-08-07 ENCOUNTER — Telehealth: Payer: Self-pay | Admitting: Gastroenterology

## 2024-08-07 NOTE — Telephone Encounter (Addendum)
 Procedure:Colonoscopy/Endoscopy Procedure date: 08/15/24 Procedure location: [facility] Arrival Time: 7:30 am Spoke with the patient Y/N:   No, I left a detailed message on [phone] on 08/07/24 @ 2:26 pm for the patient to return call   No, I left a detailed message on [phone] on 08/08/24 @ 2:07 pm for the patient to return call     Any prep concerns? ___  Has the patient obtained the prep from the pharmacy ? ___ Do you have a care partner and transportation: ___ Any additional concerns? ___

## 2024-08-15 ENCOUNTER — Ambulatory Visit (HOSPITAL_COMMUNITY): Admit: 2024-08-15 | Admitting: Gastroenterology

## 2024-08-15 ENCOUNTER — Encounter (HOSPITAL_COMMUNITY): Payer: Self-pay

## 2024-08-15 SURGERY — COLONOSCOPY
Anesthesia: Monitor Anesthesia Care

## 2024-08-16 ENCOUNTER — Other Ambulatory Visit

## 2024-09-19 ENCOUNTER — Ambulatory Visit: Admitting: Family Medicine

## 2024-11-19 ENCOUNTER — Ambulatory Visit: Admitting: Physician Assistant
